# Patient Record
Sex: Female | Born: 1940 | Race: White | Hispanic: No | Marital: Married | Smoking: Former smoker
Health system: Southern US, Community
[De-identification: ages and names within clinical notes are randomized; demographics above are authoritative.]

## PROBLEM LIST (undated history)

## (undated) DIAGNOSIS — K5730 Diverticulosis of large intestine without perforation or abscess without bleeding: Secondary | ICD-10-CM

## (undated) DIAGNOSIS — M25552 Pain in left hip: Secondary | ICD-10-CM

## (undated) DIAGNOSIS — I10 Essential (primary) hypertension: Secondary | ICD-10-CM

## (undated) DIAGNOSIS — I2510 Atherosclerotic heart disease of native coronary artery without angina pectoris: Secondary | ICD-10-CM

## (undated) DIAGNOSIS — D3500 Benign neoplasm of unspecified adrenal gland: Secondary | ICD-10-CM

## (undated) DIAGNOSIS — J449 Chronic obstructive pulmonary disease, unspecified: Secondary | ICD-10-CM

## (undated) DIAGNOSIS — E785 Hyperlipidemia, unspecified: Secondary | ICD-10-CM

## (undated) HISTORY — DX: Hyperlipidemia, unspecified: E78.5

## (undated) HISTORY — DX: Benign neoplasm of unspecified adrenal gland: D35.00

## (undated) HISTORY — DX: Chronic obstructive pulmonary disease, unspecified: J44.9

## (undated) HISTORY — PX: OTHER SURGICAL HISTORY: SHX169

## (undated) HISTORY — DX: Essential (primary) hypertension: I10

## (undated) HISTORY — DX: Pain in left hip: M25.552

## (undated) HISTORY — DX: Diverticulosis of large intestine without perforation or abscess without bleeding: K57.30

## (undated) HISTORY — DX: Atherosclerotic heart disease of native coronary artery without angina pectoris: I25.10

---

## 2004-03-22 ENCOUNTER — Encounter: Payer: Self-pay | Admitting: Family Medicine

## 2005-05-12 ENCOUNTER — Ambulatory Visit: Payer: Self-pay | Admitting: Family Medicine

## 2005-06-07 ENCOUNTER — Ambulatory Visit: Payer: Self-pay | Admitting: Family Medicine

## 2005-07-05 ENCOUNTER — Ambulatory Visit: Payer: Self-pay | Admitting: Family Medicine

## 2005-07-20 ENCOUNTER — Ambulatory Visit: Payer: Self-pay | Admitting: Family Medicine

## 2005-08-23 ENCOUNTER — Ambulatory Visit: Payer: Self-pay | Admitting: Family Medicine

## 2005-09-15 ENCOUNTER — Ambulatory Visit: Payer: Self-pay | Admitting: Family Medicine

## 2005-09-29 ENCOUNTER — Ambulatory Visit: Payer: Self-pay | Admitting: Family Medicine

## 2005-10-20 ENCOUNTER — Ambulatory Visit: Payer: Self-pay | Admitting: Family Medicine

## 2006-01-19 ENCOUNTER — Ambulatory Visit: Payer: Self-pay | Admitting: Family Medicine

## 2006-03-30 ENCOUNTER — Ambulatory Visit: Payer: Self-pay | Admitting: Family Medicine

## 2006-04-10 ENCOUNTER — Encounter: Payer: Self-pay | Admitting: Family Medicine

## 2006-04-18 ENCOUNTER — Ambulatory Visit: Payer: Self-pay | Admitting: Family Medicine

## 2006-05-18 ENCOUNTER — Ambulatory Visit: Payer: Self-pay | Admitting: Family Medicine

## 2006-05-30 DIAGNOSIS — F17200 Nicotine dependence, unspecified, uncomplicated: Secondary | ICD-10-CM

## 2006-05-30 DIAGNOSIS — E785 Hyperlipidemia, unspecified: Secondary | ICD-10-CM | POA: Insufficient documentation

## 2006-05-30 DIAGNOSIS — I2510 Atherosclerotic heart disease of native coronary artery without angina pectoris: Secondary | ICD-10-CM

## 2006-05-30 DIAGNOSIS — N393 Stress incontinence (female) (male): Secondary | ICD-10-CM

## 2006-05-30 DIAGNOSIS — I10 Essential (primary) hypertension: Secondary | ICD-10-CM | POA: Insufficient documentation

## 2006-05-30 DIAGNOSIS — F339 Major depressive disorder, recurrent, unspecified: Secondary | ICD-10-CM

## 2006-05-30 DIAGNOSIS — J449 Chronic obstructive pulmonary disease, unspecified: Secondary | ICD-10-CM

## 2006-05-30 DIAGNOSIS — K219 Gastro-esophageal reflux disease without esophagitis: Secondary | ICD-10-CM | POA: Insufficient documentation

## 2006-06-07 ENCOUNTER — Ambulatory Visit: Payer: Self-pay | Admitting: Family Medicine

## 2006-07-04 ENCOUNTER — Encounter: Payer: Self-pay | Admitting: Family Medicine

## 2006-07-25 ENCOUNTER — Encounter: Payer: Self-pay | Admitting: Family Medicine

## 2006-08-07 DIAGNOSIS — K5730 Diverticulosis of large intestine without perforation or abscess without bleeding: Secondary | ICD-10-CM | POA: Insufficient documentation

## 2006-08-30 ENCOUNTER — Encounter: Payer: Self-pay | Admitting: Family Medicine

## 2006-09-18 ENCOUNTER — Telehealth: Payer: Self-pay | Admitting: Family Medicine

## 2006-10-05 ENCOUNTER — Encounter: Payer: Self-pay | Admitting: Family Medicine

## 2006-10-05 LAB — CONVERTED CEMR LAB
ALT: 147 units/L
AST: 131 units/L
Alkaline Phosphatase: 97 units/L
BUN: 4 mg/dL
HCT: 31.8 %
Platelets: 157 10*3/uL
Potassium: 3.9 meq/L
Total Bilirubin: 0.4 mg/dL
WBC, blood: 5.3 10*3/uL

## 2006-10-06 ENCOUNTER — Encounter: Payer: Self-pay | Admitting: Family Medicine

## 2006-10-09 ENCOUNTER — Encounter: Payer: Self-pay | Admitting: Family Medicine

## 2006-10-24 ENCOUNTER — Encounter: Payer: Self-pay | Admitting: Family Medicine

## 2006-11-03 ENCOUNTER — Ambulatory Visit: Payer: Self-pay | Admitting: Family Medicine

## 2006-11-03 LAB — CONVERTED CEMR LAB
HCV Ab: NEGATIVE
Hep B Core Total Ab: NEGATIVE

## 2006-11-06 ENCOUNTER — Telehealth: Payer: Self-pay | Admitting: Family Medicine

## 2006-11-07 ENCOUNTER — Encounter: Payer: Self-pay | Admitting: Family Medicine

## 2006-12-12 ENCOUNTER — Encounter: Payer: Self-pay | Admitting: Family Medicine

## 2006-12-26 ENCOUNTER — Encounter: Payer: Self-pay | Admitting: Family Medicine

## 2007-01-03 ENCOUNTER — Encounter: Payer: Self-pay | Admitting: Family Medicine

## 2007-06-20 ENCOUNTER — Ambulatory Visit: Payer: Self-pay | Admitting: Family Medicine

## 2007-06-21 ENCOUNTER — Telehealth: Payer: Self-pay | Admitting: Family Medicine

## 2007-06-22 ENCOUNTER — Encounter: Admission: RE | Admit: 2007-06-22 | Discharge: 2007-06-22 | Payer: Self-pay | Admitting: Family Medicine

## 2007-06-25 ENCOUNTER — Ambulatory Visit: Payer: Self-pay | Admitting: Family Medicine

## 2007-06-25 DIAGNOSIS — M67919 Unspecified disorder of synovium and tendon, unspecified shoulder: Secondary | ICD-10-CM | POA: Insufficient documentation

## 2007-06-25 DIAGNOSIS — M719 Bursopathy, unspecified: Secondary | ICD-10-CM

## 2007-07-13 ENCOUNTER — Encounter: Admission: RE | Admit: 2007-07-13 | Discharge: 2007-07-30 | Payer: Self-pay | Admitting: Family Medicine

## 2007-07-13 ENCOUNTER — Encounter: Payer: Self-pay | Admitting: Family Medicine

## 2007-07-30 ENCOUNTER — Encounter: Payer: Self-pay | Admitting: Family Medicine

## 2007-08-01 ENCOUNTER — Telehealth: Payer: Self-pay | Admitting: Family Medicine

## 2007-08-03 ENCOUNTER — Telehealth: Payer: Self-pay | Admitting: Family Medicine

## 2007-08-09 ENCOUNTER — Encounter: Payer: Self-pay | Admitting: Family Medicine

## 2007-08-09 ENCOUNTER — Encounter: Admission: RE | Admit: 2007-08-09 | Discharge: 2007-08-09 | Payer: Self-pay | Admitting: Sports Medicine

## 2007-08-20 ENCOUNTER — Ambulatory Visit: Payer: Self-pay | Admitting: Family Medicine

## 2007-09-24 ENCOUNTER — Telehealth: Payer: Self-pay | Admitting: Family Medicine

## 2007-10-01 ENCOUNTER — Ambulatory Visit: Payer: Self-pay | Admitting: Family Medicine

## 2007-12-03 ENCOUNTER — Telehealth: Payer: Self-pay | Admitting: Family Medicine

## 2007-12-25 ENCOUNTER — Telehealth: Payer: Self-pay | Admitting: Family Medicine

## 2007-12-27 ENCOUNTER — Telehealth: Payer: Self-pay | Admitting: Family Medicine

## 2008-01-18 ENCOUNTER — Encounter: Payer: Self-pay | Admitting: Family Medicine

## 2008-01-18 LAB — CONVERTED CEMR LAB
AST: 16 units/L (ref 0–37)
Albumin: 4.2 g/dL (ref 3.5–5.2)
Alkaline Phosphatase: 78 units/L (ref 39–117)
CO2: 22 meq/L (ref 19–32)
Calcium: 9.3 mg/dL (ref 8.4–10.5)
Cholesterol: 205 mg/dL — ABNORMAL HIGH (ref 0–200)
Creatinine, Ser: 0.86 mg/dL (ref 0.40–1.20)
Potassium: 4.8 meq/L (ref 3.5–5.3)
Sodium: 138 meq/L (ref 135–145)
Total Bilirubin: 0.5 mg/dL (ref 0.3–1.2)
Total CHOL/HDL Ratio: 5.1

## 2008-01-21 ENCOUNTER — Encounter: Payer: Self-pay | Admitting: Family Medicine

## 2008-01-31 ENCOUNTER — Encounter: Admission: RE | Admit: 2008-01-31 | Discharge: 2008-01-31 | Payer: Self-pay | Admitting: Family Medicine

## 2008-01-31 ENCOUNTER — Ambulatory Visit: Payer: Self-pay | Admitting: Family Medicine

## 2008-02-01 ENCOUNTER — Telehealth (INDEPENDENT_AMBULATORY_CARE_PROVIDER_SITE_OTHER): Payer: Self-pay | Admitting: *Deleted

## 2008-02-11 ENCOUNTER — Encounter: Payer: Self-pay | Admitting: Family Medicine

## 2008-03-24 ENCOUNTER — Encounter: Admission: RE | Admit: 2008-03-24 | Discharge: 2008-03-24 | Payer: Self-pay | Admitting: Internal Medicine

## 2008-05-22 ENCOUNTER — Ambulatory Visit: Payer: Self-pay | Admitting: Family Medicine

## 2008-05-23 ENCOUNTER — Telehealth: Payer: Self-pay | Admitting: Family Medicine

## 2008-06-02 ENCOUNTER — Telehealth (INDEPENDENT_AMBULATORY_CARE_PROVIDER_SITE_OTHER): Payer: Self-pay | Admitting: *Deleted

## 2008-06-02 ENCOUNTER — Ambulatory Visit: Payer: Self-pay | Admitting: Family Medicine

## 2008-06-02 ENCOUNTER — Telehealth: Payer: Self-pay | Admitting: Family Medicine

## 2008-06-02 DIAGNOSIS — T7840XA Allergy, unspecified, initial encounter: Secondary | ICD-10-CM | POA: Insufficient documentation

## 2008-06-09 ENCOUNTER — Encounter: Payer: Self-pay | Admitting: Family Medicine

## 2008-06-10 ENCOUNTER — Telehealth: Payer: Self-pay | Admitting: Family Medicine

## 2008-06-11 ENCOUNTER — Telehealth (INDEPENDENT_AMBULATORY_CARE_PROVIDER_SITE_OTHER): Payer: Self-pay | Admitting: *Deleted

## 2008-06-16 ENCOUNTER — Telehealth: Payer: Self-pay | Admitting: Family Medicine

## 2008-07-03 ENCOUNTER — Telehealth: Payer: Self-pay | Admitting: Family Medicine

## 2008-07-07 ENCOUNTER — Ambulatory Visit: Payer: Self-pay | Admitting: Family Medicine

## 2008-08-04 ENCOUNTER — Ambulatory Visit: Payer: Self-pay | Admitting: Family Medicine

## 2008-08-04 DIAGNOSIS — R7309 Other abnormal glucose: Secondary | ICD-10-CM | POA: Insufficient documentation

## 2008-08-06 ENCOUNTER — Encounter: Payer: Self-pay | Admitting: Family Medicine

## 2008-08-13 ENCOUNTER — Telehealth: Payer: Self-pay | Admitting: Family Medicine

## 2008-08-13 ENCOUNTER — Ambulatory Visit: Payer: Self-pay | Admitting: Family Medicine

## 2008-08-13 DIAGNOSIS — M2550 Pain in unspecified joint: Secondary | ICD-10-CM

## 2008-08-13 DIAGNOSIS — R609 Edema, unspecified: Secondary | ICD-10-CM

## 2008-08-13 LAB — CONVERTED CEMR LAB
Bilirubin Urine: NEGATIVE
Blood in Urine, dipstick: NEGATIVE
Ketones, urine, test strip: NEGATIVE
Protein, U semiquant: NEGATIVE
Urobilinogen, UA: NEGATIVE
pH: 7

## 2008-08-14 ENCOUNTER — Encounter: Payer: Self-pay | Admitting: Family Medicine

## 2008-08-18 ENCOUNTER — Telehealth: Payer: Self-pay | Admitting: Family Medicine

## 2008-08-18 ENCOUNTER — Telehealth (INDEPENDENT_AMBULATORY_CARE_PROVIDER_SITE_OTHER): Payer: Self-pay | Admitting: *Deleted

## 2008-08-19 LAB — CONVERTED CEMR LAB
ALT: 52 units/L — ABNORMAL HIGH (ref 0–35)
Albumin: 4.3 g/dL (ref 3.5–5.2)
Chloride: 107 meq/L (ref 96–112)
Glucose, Bld: 84 mg/dL (ref 70–99)
Iron: 82 ug/dL (ref 42–145)
MCHC: 32.3 g/dL (ref 30.0–36.0)
MCV: 96.2 fL (ref 78.0–100.0)
Platelets: 321 10*3/uL (ref 150–400)
RBC: 3.96 M/uL (ref 3.87–5.11)
RDW: 15.4 % (ref 11.5–15.5)
Rhuematoid fact SerPl-aCnc: <20 intl units/mL (ref 0–20)
Sed Rate: 32 mm/hr — ABNORMAL HIGH (ref 0–22)
Total Protein: 6.6 g/dL (ref 6.0–8.3)
WBC: 5.5 10*3/uL (ref 4.0–10.5)

## 2008-08-26 ENCOUNTER — Encounter: Payer: Self-pay | Admitting: Family Medicine

## 2008-08-27 ENCOUNTER — Encounter: Admission: RE | Admit: 2008-08-27 | Discharge: 2008-08-27 | Payer: Self-pay | Admitting: Internal Medicine

## 2008-08-27 ENCOUNTER — Ambulatory Visit: Payer: Self-pay | Admitting: Family Medicine

## 2008-08-27 DIAGNOSIS — Z8639 Personal history of other endocrine, nutritional and metabolic disease: Secondary | ICD-10-CM

## 2008-08-27 DIAGNOSIS — Z862 Personal history of diseases of the blood and blood-forming organs and certain disorders involving the immune mechanism: Secondary | ICD-10-CM

## 2008-09-05 ENCOUNTER — Encounter: Payer: Self-pay | Admitting: Family Medicine

## 2008-09-12 ENCOUNTER — Encounter: Payer: Self-pay | Admitting: Family Medicine

## 2008-09-16 LAB — CONVERTED CEMR LAB
ALT: 15 units/L (ref 0–35)
AST: 16 units/L (ref 0–37)

## 2008-10-02 ENCOUNTER — Telehealth: Payer: Self-pay | Admitting: Family Medicine

## 2008-10-06 ENCOUNTER — Telehealth: Payer: Self-pay | Admitting: Family Medicine

## 2008-12-12 ENCOUNTER — Encounter: Payer: Self-pay | Admitting: Family Medicine

## 2009-01-22 ENCOUNTER — Telehealth: Payer: Self-pay | Admitting: Family Medicine

## 2009-01-23 ENCOUNTER — Telehealth (INDEPENDENT_AMBULATORY_CARE_PROVIDER_SITE_OTHER): Payer: Self-pay | Admitting: *Deleted

## 2009-01-26 ENCOUNTER — Telehealth: Payer: Self-pay | Admitting: Family Medicine

## 2009-01-26 ENCOUNTER — Ambulatory Visit: Payer: Self-pay | Admitting: Family Medicine

## 2009-01-26 ENCOUNTER — Encounter: Admission: RE | Admit: 2009-01-26 | Discharge: 2009-01-26 | Payer: Self-pay | Admitting: Family Medicine

## 2009-01-26 DIAGNOSIS — S335XXA Sprain of ligaments of lumbar spine, initial encounter: Secondary | ICD-10-CM

## 2009-01-26 LAB — CONVERTED CEMR LAB
Glucose, Urine, Semiquant: NEGATIVE
Protein, U semiquant: NEGATIVE

## 2009-01-27 ENCOUNTER — Encounter: Payer: Self-pay | Admitting: Family Medicine

## 2009-02-06 ENCOUNTER — Telehealth: Payer: Self-pay | Admitting: Family Medicine

## 2009-02-09 ENCOUNTER — Encounter: Payer: Self-pay | Admitting: Family Medicine

## 2009-02-10 ENCOUNTER — Telehealth (INDEPENDENT_AMBULATORY_CARE_PROVIDER_SITE_OTHER): Payer: Self-pay | Admitting: *Deleted

## 2009-02-10 ENCOUNTER — Encounter: Payer: Self-pay | Admitting: Family Medicine

## 2009-02-17 ENCOUNTER — Telehealth (INDEPENDENT_AMBULATORY_CARE_PROVIDER_SITE_OTHER): Payer: Self-pay | Admitting: *Deleted

## 2009-02-27 ENCOUNTER — Telehealth: Payer: Self-pay | Admitting: Family Medicine

## 2009-03-09 ENCOUNTER — Telehealth: Payer: Self-pay | Admitting: Family Medicine

## 2009-03-17 ENCOUNTER — Telehealth: Payer: Self-pay | Admitting: Family Medicine

## 2009-04-14 ENCOUNTER — Encounter: Payer: Self-pay | Admitting: Family Medicine

## 2009-04-28 ENCOUNTER — Telehealth: Payer: Self-pay | Admitting: Family Medicine

## 2009-05-28 ENCOUNTER — Telehealth: Payer: Self-pay | Admitting: Family Medicine

## 2009-06-08 ENCOUNTER — Telehealth: Payer: Self-pay | Admitting: Family Medicine

## 2009-06-30 ENCOUNTER — Ambulatory Visit: Payer: Self-pay | Admitting: Family Medicine

## 2009-07-02 ENCOUNTER — Encounter: Payer: Self-pay | Admitting: Family Medicine

## 2009-07-03 LAB — CONVERTED CEMR LAB
AST: 14 units/L (ref 0–37)
Albumin: 4.1 g/dL (ref 3.5–5.2)
Alkaline Phosphatase: 69 units/L (ref 39–117)
BUN: 21 mg/dL (ref 6–23)
Basophils Absolute: 0 10*3/uL (ref 0.0–0.1)
Basophils Relative: 0 % (ref 0–1)
Calcium: 9.4 mg/dL (ref 8.4–10.5)
Creatinine, Ser: 0.98 mg/dL (ref 0.40–1.20)
Eosinophils Absolute: 0.1 10*3/uL (ref 0.0–0.7)
Eosinophils Relative: 1 % (ref 0–5)
Lymphocytes Relative: 32 % (ref 12–46)
MCV: 92.6 fL (ref 78.0–100.0)
Monocytes Absolute: 0.2 10*3/uL (ref 0.1–1.0)
Platelets: 262 10*3/uL (ref 150–400)
RBC: 4.34 M/uL (ref 3.87–5.11)
RDW: 13.9 % (ref 11.5–15.5)
Total Protein: 6.3 g/dL (ref 6.0–8.3)
VLDL: 39 mg/dL (ref 0–40)
WBC: 8.9 10*3/uL (ref 4.0–10.5)

## 2009-07-27 ENCOUNTER — Encounter: Payer: Self-pay | Admitting: Family Medicine

## 2009-07-30 ENCOUNTER — Encounter: Payer: Self-pay | Admitting: Family Medicine

## 2009-07-30 DIAGNOSIS — I080 Rheumatic disorders of both mitral and aortic valves: Secondary | ICD-10-CM

## 2009-07-31 ENCOUNTER — Ambulatory Visit: Payer: Self-pay | Admitting: Family Medicine

## 2009-07-31 DIAGNOSIS — N3000 Acute cystitis without hematuria: Secondary | ICD-10-CM | POA: Insufficient documentation

## 2009-07-31 LAB — CONVERTED CEMR LAB
Bilirubin Urine: NEGATIVE
Glucose, Urine, Semiquant: NEGATIVE
Ketones, urine, test strip: NEGATIVE
Protein, U semiquant: NEGATIVE
Specific Gravity, Urine: 1.01

## 2009-08-01 ENCOUNTER — Encounter: Payer: Self-pay | Admitting: Family Medicine

## 2009-08-11 ENCOUNTER — Telehealth: Payer: Self-pay | Admitting: Family Medicine

## 2009-08-13 ENCOUNTER — Encounter: Admission: RE | Admit: 2009-08-13 | Discharge: 2009-08-13 | Payer: Self-pay | Admitting: Sports Medicine

## 2009-08-24 ENCOUNTER — Telehealth (INDEPENDENT_AMBULATORY_CARE_PROVIDER_SITE_OTHER): Payer: Self-pay | Admitting: *Deleted

## 2009-08-28 ENCOUNTER — Encounter: Admission: RE | Admit: 2009-08-28 | Discharge: 2009-08-28 | Payer: Self-pay | Admitting: Internal Medicine

## 2009-11-05 ENCOUNTER — Telehealth: Payer: Self-pay | Admitting: Family Medicine

## 2009-12-09 ENCOUNTER — Telehealth: Payer: Self-pay | Admitting: Family Medicine

## 2009-12-12 ENCOUNTER — Ambulatory Visit: Payer: Self-pay | Admitting: Family Medicine

## 2009-12-12 ENCOUNTER — Encounter: Payer: Self-pay | Admitting: Family Medicine

## 2009-12-12 DIAGNOSIS — IMO0002 Reserved for concepts with insufficient information to code with codable children: Secondary | ICD-10-CM | POA: Insufficient documentation

## 2009-12-12 DIAGNOSIS — M25529 Pain in unspecified elbow: Secondary | ICD-10-CM | POA: Insufficient documentation

## 2010-01-08 ENCOUNTER — Telehealth: Payer: Self-pay | Admitting: Family Medicine

## 2010-02-11 ENCOUNTER — Encounter: Payer: Self-pay | Admitting: Family Medicine

## 2010-02-16 ENCOUNTER — Encounter: Payer: Self-pay | Admitting: Family Medicine

## 2010-02-23 ENCOUNTER — Ambulatory Visit: Payer: Self-pay | Admitting: Family Medicine

## 2010-02-23 DIAGNOSIS — J441 Chronic obstructive pulmonary disease with (acute) exacerbation: Secondary | ICD-10-CM | POA: Insufficient documentation

## 2010-03-05 ENCOUNTER — Ambulatory Visit: Payer: Self-pay | Admitting: Family Medicine

## 2010-03-29 ENCOUNTER — Telehealth: Payer: Self-pay | Admitting: Family Medicine

## 2010-04-06 ENCOUNTER — Encounter: Admission: RE | Admit: 2010-04-06 | Discharge: 2010-04-06 | Payer: Self-pay | Admitting: Family Medicine

## 2010-04-06 ENCOUNTER — Ambulatory Visit: Payer: Self-pay | Admitting: Family Medicine

## 2010-04-06 DIAGNOSIS — J42 Unspecified chronic bronchitis: Secondary | ICD-10-CM | POA: Insufficient documentation

## 2010-04-12 ENCOUNTER — Encounter: Payer: Self-pay | Admitting: Family Medicine

## 2010-04-14 ENCOUNTER — Telehealth (INDEPENDENT_AMBULATORY_CARE_PROVIDER_SITE_OTHER): Payer: Self-pay | Admitting: *Deleted

## 2010-04-16 ENCOUNTER — Encounter: Payer: Self-pay | Admitting: Family Medicine

## 2010-04-23 ENCOUNTER — Telehealth: Payer: Self-pay | Admitting: Family Medicine

## 2010-05-14 ENCOUNTER — Telehealth: Payer: Self-pay | Admitting: Family Medicine

## 2010-05-25 ENCOUNTER — Encounter (INDEPENDENT_AMBULATORY_CARE_PROVIDER_SITE_OTHER): Payer: Self-pay | Admitting: *Deleted

## 2010-06-07 ENCOUNTER — Ambulatory Visit: Payer: Self-pay | Admitting: Family Medicine

## 2010-06-07 DIAGNOSIS — R61 Generalized hyperhidrosis: Secondary | ICD-10-CM | POA: Insufficient documentation

## 2010-06-07 DIAGNOSIS — G2581 Restless legs syndrome: Secondary | ICD-10-CM | POA: Insufficient documentation

## 2010-07-05 ENCOUNTER — Encounter: Payer: Self-pay | Admitting: Family Medicine

## 2010-07-06 LAB — CONVERTED CEMR LAB
AST: 17 units/L (ref 0–37)
Alkaline Phosphatase: 77 units/L (ref 39–117)
Basophils Absolute: 0 10*3/uL (ref 0.0–0.1)
Basophils Relative: 1 % (ref 0–1)
CO2: 25 meq/L (ref 19–32)
Calcium: 9.6 mg/dL (ref 8.4–10.5)
Eosinophils Absolute: 0.2 10*3/uL (ref 0.0–0.7)
Eosinophils Relative: 2 % (ref 0–5)
Glucose, Bld: 108 mg/dL — ABNORMAL HIGH (ref 70–99)
Hemoglobin: 14.2 g/dL (ref 12.0–15.0)
LDL Cholesterol: 80 mg/dL (ref 0–99)
Lymphocytes Relative: 29 % (ref 12–46)
MCV: 95.7 fL (ref 78.0–100.0)
Neutro Abs: 4.7 10*3/uL (ref 1.7–7.7)
Neutrophils Relative %: 64 % (ref 43–77)
Platelets: 241 10*3/uL (ref 150–400)
Potassium: 4.3 meq/L (ref 3.5–5.3)
Triglycerides: 170 mg/dL — ABNORMAL HIGH (ref <150)

## 2010-07-20 ENCOUNTER — Ambulatory Visit: Payer: Self-pay | Admitting: Family Medicine

## 2010-08-24 ENCOUNTER — Telehealth (INDEPENDENT_AMBULATORY_CARE_PROVIDER_SITE_OTHER): Payer: Self-pay | Admitting: *Deleted

## 2010-08-26 ENCOUNTER — Telehealth: Payer: Self-pay | Admitting: Family Medicine

## 2010-08-31 ENCOUNTER — Encounter: Payer: Self-pay | Admitting: Family Medicine

## 2010-09-02 ENCOUNTER — Encounter: Payer: Self-pay | Admitting: Family Medicine

## 2010-09-21 NOTE — Assessment & Plan Note (Signed)
Summary: ELBOW SWOLLEN/TM   Vital Signs:  Patient Profile:   70 Years Old Female CC:      R elbow pain x 2dys Height:     64.5 inches Weight:      192 pounds O2 Sat:      100 % O2 treatment:    Room Air Temp:     97.3 degrees F oral Pulse rate:   71 / minute Pulse rhythm:   regular Resp:     16 per minute BP sitting:   131 / 81  (right arm) Cuff size:   regular  Vitals Entered By: Areta Haber CMA (December 12, 2009 5:06 PM)                  Prior Medication List:  ADVAIR DISKUS 100-50 MCG/DOSE MISC (FLUTICASONE-SALMETEROL) 1 puff bid ALBUTEROL SULFATE (2.5 MG/3ML) 0.083% NEBU (ALBUTEROL SULFATE) every 4-6 hrs as needed ASPIR-LOW 81 MG TBEC (ASPIRIN) by mouth daily NORVASC 5 MG TABS (AMLODIPINE BESYLATE) 1 tab by mouth daily CITALOPRAM HYDROBROMIDE 40 MG TABS (CITALOPRAM HYDROBROMIDE) 1 tab by mouth once daily TOPROL XL 100 MG XR24H-TAB (METOPROLOL SUCCINATE) 1 tab by mouth daily HYDROCODONE-HOMATROPINE 5-1.5 MG/5ML SYRP (HYDROCODONE-HOMATROPINE) 5 ml by mouth at bedtime as needed cough CIPROFLOXACIN HCL 250 MG TABS (CIPROFLOXACIN HCL) 1 PO two times a day PYRIDIUM 100 MG TABS (PHENAZOPYRIDINE HCL) 1 by mouth three times a day pc   Current Allergies (reviewed today): ! CODEINE * ALTACE     History of Present Illness Chief Complaint: R elbow pain x 2dys  Current Problems: ELBOW PAIN, RIGHT (ICD-719.42) MUSCULOSKELETAL PAIN (ICD-781.99) ACUTE CYSTITIS (ICD-595.0) ACUTE CYSTITIS (ICD-595.0) MITRAL REGURGITATION (ICD-396.3) LUMBAR SPRAIN AND STRAIN (ICD-847.2) LIVER FUNCTION TESTS, ABNORMAL, HX OF (ICD-V12.2) POLYARTHRALGIA (ICD-719.49) EDEMA (ICD-782.3) HYPERGLYCEMIA (ICD-790.29) ALLERGIC REACTION (ICD-995.3) ROTATOR CUFF SYNDROME, RIGHT (ICD-726.10) DIVERTICULOSIS, COLON (ICD-562.10) TOBACCO DEPENDENCE (ICD-305.1) INCONTINENCE, STRESS, FEMALE (ICD-625.6) HYPERTENSION, BENIGN SYSTEMIC (ICD-401.1) HYPERLIPIDEMIA (ICD-272.4) GASTROESOPHAGEAL REFLUX, NO  ESOPHAGITIS (ICD-530.81) DEPRESSION, MAJOR, RECURRENT (ICD-296.30) COPD (ICD-496) ARTERIOSCLEROSIS, CORONARY (ICD-414.00)   Current Meds ADVAIR DISKUS 100-50 MCG/DOSE MISC (FLUTICASONE-SALMETEROL) 1 puff bid ALBUTEROL SULFATE (2.5 MG/3ML) 0.083% NEBU (ALBUTEROL SULFATE) every 4-6 hrs as needed ASPIR-LOW 81 MG TBEC (ASPIRIN) by mouth daily NORVASC 5 MG TABS (AMLODIPINE BESYLATE) 1 tab by mouth daily CITALOPRAM HYDROBROMIDE 40 MG TABS (CITALOPRAM HYDROBROMIDE) 1 tab by mouth once daily TOPROL XL 100 MG XR24H-TAB (METOPROLOL SUCCINATE) 1 tab by mouth daily HYDROCODONE-HOMATROPINE 5-1.5 MG/5ML SYRP (HYDROCODONE-HOMATROPINE) 5 ml by mouth at bedtime as needed cough CIPROFLOXACIN HCL 250 MG TABS (CIPROFLOXACIN HCL) 1 PO two times a day PYRIDIUM 100 MG TABS (PHENAZOPYRIDINE HCL) 1 by mouth three times a day pc  REVIEW OF SYSTEMS Constitutional Symptoms      Denies fever, chills, night sweats, weight loss, weight gain, and fatigue.  Eyes       Denies change in vision, eye pain, eye discharge, glasses, contact lenses, and eye surgery. Ear/Nose/Throat/Mouth       Denies hearing loss/aids, change in hearing, ear pain, ear discharge, dizziness, frequent runny nose, frequent nose bleeds, sinus problems, sore throat, hoarseness, and tooth pain or bleeding.  Respiratory       Denies dry cough, productive cough, wheezing, shortness of breath, asthma, bronchitis, and emphysema/COPD.  Cardiovascular       Denies murmurs, chest pain, and tires easily with exhertion.    Gastrointestinal       Denies stomach pain, nausea/vomiting, diarrhea, constipation, blood in bowel movements, and indigestion. Genitourniary       Denies  painful urination, kidney stones, and loss of urinary control. Neurological       Denies paralysis, seizures, and fainting/blackouts. Musculoskeletal       Complains of redness and swelling.      Denies muscle pain, joint pain, joint stiffness, decreased range of motion, muscle  weakness, and gout.      Comments: R elbow x 2 dys Skin       Denies bruising, unusual mles/lumps or sores, and hair/skin or nail changes.  Psych       Denies mood changes, temper/anger issues, anxiety/stress, speech problems, depression, and sleep problems. Other Comments: Pt has not seen PCP for this.   Past History:  Past Medical History: Last updated: 08/27/2008 [id number] Left hip pain Diverticulosis, colon 1 cm L adrenal adenoma (CT done by Dr Zachery Conch 12-07) stress and urge urinary incontinence-- may go for [facility] mid-urethral sling surgery cath 4-08 normal COPD/ lung nodule -- Dr Steele Berg HTN hyperlipidemia CAD-- Dr Lorenso Courier , [facility] cardiology  Past Surgical History: Last updated: 10/03/2006 [place] mid urethral sling done by Dr Doristine Locks  Family History: Last updated: July 12, 2006 Father died of Oat cell lung CA, ETOH  Mother died with HTN; high cholestrol  Social History: Last updated: 08/27/2008 Retired.  Lives with husband Onalee Hua.  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  Having trouble with her son who is abusing perscription meds.  Walks 1 hrs daily.  Sexually active.  Quit smoking in 12-09.  Risk Factors: Smoking Status: quit (08/27/2008) Assessment New Problems: ELBOW PAIN, RIGHT (ICD-719.42) MUSCULOSKELETAL PAIN (ICD-781.99)   The patient and/or caregiver has been counseled thoroughly with regard to medications prescribed including dosage, schedule, interactions, rationale for use, and possible side effects and they verbalize understanding.  Diagnoses and expected course of recovery discussed and will return if not improved as expected or if the condition worsens. Patient and/or caregiver verbalized understanding.

## 2010-09-21 NOTE — Assessment & Plan Note (Signed)
Summary: ELBOW/TM    Prior Medication List:  ADVAIR DISKUS 100-50 MCG/DOSE MISC (FLUTICASONE-SALMETEROL) 1 puff bid ALBUTEROL SULFATE (2.5 MG/3ML) 0.083% NEBU (ALBUTEROL SULFATE) every 4-6 hrs as needed ASPIR-LOW 81 MG TBEC (ASPIRIN) by mouth daily NORVASC 5 MG TABS (AMLODIPINE BESYLATE) 1 tab by mouth daily CITALOPRAM HYDROBROMIDE 40 MG TABS (CITALOPRAM HYDROBROMIDE) 1 tab by mouth once daily TOPROL XL 100 MG XR24H-TAB (METOPROLOL SUCCINATE) 1 tab by mouth daily HYDROCODONE-HOMATROPINE 5-1.5 MG/5ML SYRP (HYDROCODONE-HOMATROPINE) 5 ml by mouth at bedtime as needed cough CIPROFLOXACIN HCL 250 MG TABS (CIPROFLOXACIN HCL) 1 PO two times a day PYRIDIUM 100 MG TABS (PHENAZOPYRIDINE HCL) 1 by mouth three times a day pc   Current Allergies: ! CODEINE * ALTACEHistory of Present Illness History of Present Illness: Patient  states she has had elbow pain for 3 days. Worse last night and the elbow has gotten red. She denies any injury or insect bites. States that there is an area on he elbow that ifshe touches it feels like electricity goes up her elbow.  Current Problems: CELLULITIS AND ABSCESS OF UPPER ARM AND FOREARM (ICD-682.3) ELBOW PAIN, RIGHT (ICD-719.42) MUSCULOSKELETAL PAIN (ICD-781.99) ELBOW PAIN, RIGHT (ICD-719.42) MUSCULOSKELETAL PAIN (ICD-781.99) ACUTE CYSTITIS (ICD-595.0) ACUTE CYSTITIS (ICD-595.0) MITRAL REGURGITATION (ICD-396.3) LUMBAR SPRAIN AND STRAIN (ICD-847.2) LIVER FUNCTION TESTS, ABNORMAL, HX OF (ICD-V12.2) POLYARTHRALGIA (ICD-719.49) EDEMA (ICD-782.3) HYPERGLYCEMIA (ICD-790.29) ALLERGIC REACTION (ICD-995.3) ROTATOR CUFF SYNDROME, RIGHT (ICD-726.10) DIVERTICULOSIS, COLON (ICD-562.10) TOBACCO DEPENDENCE (ICD-305.1) INCONTINENCE, STRESS, FEMALE (ICD-625.6) HYPERTENSION, BENIGN SYSTEMIC (ICD-401.1) HYPERLIPIDEMIA (ICD-272.4) GASTROESOPHAGEAL REFLUX, NO ESOPHAGITIS (ICD-530.81) DEPRESSION, MAJOR, RECURRENT (ICD-296.30) COPD (ICD-496) ARTERIOSCLEROSIS, CORONARY  (ICD-414.00)   Current Meds ADVAIR DISKUS 100-50 MCG/DOSE MISC (FLUTICASONE-SALMETEROL) 1 puff bid ALBUTEROL SULFATE (2.5 MG/3ML) 0.083% NEBU (ALBUTEROL SULFATE) every 4-6 hrs as needed ASPIR-LOW 81 MG TBEC (ASPIRIN) by mouth daily NORVASC 5 MG TABS (AMLODIPINE BESYLATE) 1 tab by mouth daily CITALOPRAM HYDROBROMIDE 40 MG TABS (CITALOPRAM HYDROBROMIDE) 1 tab by mouth once daily TOPROL XL 100 MG XR24H-TAB (METOPROLOL SUCCINATE) 1 tab by mouth daily HYDROCODONE-HOMATROPINE 5-1.5 MG/5ML SYRP (HYDROCODONE-HOMATROPINE) 5 ml by mouth at bedtime as needed cough CIPROFLOXACIN HCL 250 MG TABS (CIPROFLOXACIN HCL) 1 PO two times a day PYRIDIUM 100 MG TABS (PHENAZOPYRIDINE HCL) 1 by mouth three times a day pc SEPTRA DS 800-160 MG TABS (SULFAMETHOXAZOLE-TRIMETHOPRIM) 1 by mouth twice daily BACTROBAN 2 % OINT (MUPIROCIN) apply to elbow 3 x a day next 10 days  REVIEW OF SYSTEMS Constitutional Symptoms      Denies fever and chills.  Eyes       Denies change in vision. Ear/Nose/Throat/Mouth       Denies hearing loss/aids, change in hearing, and frequent runny nose.      Musculoskeletal       Complains of muscle pain, joint pain, joint stiffness, redness, and swelling.      Denies decreased range of motion and muscle weakness.    Past History:  Past Medical History: Last updated: 08/27/2008 [id number] Left hip pain Diverticulosis, colon 1 cm L adrenal adenoma (CT done by Dr Zachery Conch 12-07) stress and urge urinary incontinence-- may go for [facility] mid-urethral sling surgery cath 4-08 normal COPD/ lung nodule -- Dr Steele Berg HTN hyperlipidemia CAD-- Dr Lorenso Courier , [facility] cardiology  Past Surgical History: Last updated: 10/03/2006 [place] mid urethral sling done by Dr Doristine Locks  Family History: Last updated: 20-Jul-2006 Father died of Oat cell lung CA, ETOH  Mother died with HTN; high cholestrol  Social History: Last updated: 08/27/2008 Retired.  Lives with husband Onalee Hua.  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  Having trouble with her son who is abusing perscription meds.  Walks 1 hrs daily.  Sexually active.  Quit smoking in 12-09.  Risk Factors: Smoking Status: quit (08/27/2008)  Family History: Reviewed history from 07/04/2006 and no changes required. Father died of Oat cell lung CA, ETOH  Mother died with HTN; high cholestrol  Social History: Reviewed history from 08/27/2008 and no changes required. Retired.  Lives with husband Onalee Hua.  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  Having trouble with her son who is abusing perscription meds.  Walks 1 hrs daily.  Sexually active.  Quit smoking in 12-09. Physical Exam General appearance: well developed, well nourished,mild distress Head: normocephalic, atraumatic Extremities: R elbow has diffusely hyperemic, warm to thre touch and pin point tendernessss. Skin: no obvious rashes or lesions MSE: oriented to time, place, and person Assessment New Problems: CELLULITIS AND ABSCESS OF UPPER ARM AND FOREARM (ICD-682.3) ELBOW PAIN, RIGHT (ICD-719.42) MUSCULOSKELETAL PAIN (ICD-781.99)  cellulitis of the elbow elbow pain  Plan New Medications/Changes: BACTROBAN 2 % OINT (MUPIROCIN) apply to elbow 3 x a day next 10 days  #1 tube x 0, 12/12/2009, Hassan Rowan MD SEPTRA DS 800-160 MG TABS (SULFAMETHOXAZOLE-TRIMETHOPRIM) 1 by mouth twice daily  #20 x 0, 12/12/2009, Hassan Rowan MD  New Orders: New Patient Level III [99203] Ketorolac-Toradol 15mg  [Z6109] T-DG Elbow 2 Views*R* [73070] Admin of Therapeutic Inj  intramuscular or subcutaneous [96372] Planning Comments:   continue w/ other medication  Follow Up: Follow up in 2-3 days if no improvement, Follow up with Primary Physician Follow Up: or w/ orthopedic  The patient and/or caregiver has been counseled thoroughly with regard to medications prescribed including dosage, schedule, interactions, rationale for use, and possible side effects and they verbalize understanding.  Diagnoses and  expected course of recovery discussed and will return if not improved as expected or if the condition worsens. Patient and/or caregiver verbalized understanding.  Prescriptions: BACTROBAN 2 % OINT (MUPIROCIN) apply to elbow 3 x a day next 10 days  #1 tube x 0   Entered and Authorized by:   Hassan Rowan MD   Signed by:   Hassan Rowan MD on 12/12/2009   Method used:   Printed then faxed to ...       [facility]  [address] [phone]* (retail)       [address] [place], [place]  [address]       Ph: [phone] or [phone]       Fax: [phone]   RxID:   [id number] SEPTRA DS 800-160 MG TABS (SULFAMETHOXAZOLE-TRIMETHOPRIM) 1 by mouth twice daily  #20 x 0   Entered and Authorized by:   Hassan Rowan MD   Signed by:   Hassan Rowan MD on 12/12/2009   Method used:   Printed then faxed to ...       [facility]  [address] [phone]* (retail)       [address] [place], [place]  [address]       Ph: [phone] or [phone]       Fax: [phone]   RxID:   [id number]   Patient Instructions: 1)  Please schedule an appointment with your primary doctor in :3-4 days if not improving 2)  Take your antibiotic as prescribed until ALL of it is gone, but stop if you develop a rash or swelling and contact our office as soon as possible. 3)  While tis could also be a bursitis of  the elbow it does not seems more likely that this is an infectious problem 4)  Please schedule a follow-up appointment as needed.   Medication Administration  Injection # 1:    Medication: Ketorolac-Toradol 15mg     Diagnosis: CELLULITIS AND ABSCESS OF UPPER ARM AND FOREARM (ICD-682.3)    Route: IM    Site: LUOQ gluteus    Exp Date: 03/23/2011    Lot #: [id number]    Mfr: Hospira    Comments: Administered 60mg     Patient tolerated injection without complications    Given by: Areta Haber CMA (December 12, 2009 6:09 PM)  Orders Added: 1)  New Patient Level III [99203] 2)  Ketorolac-Toradol 15mg  [J1885] 3)   T-DG Elbow 2 Views*R* [73070] 4)  Admin of Therapeutic Inj  intramuscular or subcutaneous [64332]

## 2010-09-21 NOTE — Progress Notes (Signed)
Summary: Cough med  Phone Note Call from Patient Call back at Home Phone [phone]   Caller: Patient Call For: Seymour Bars DO Summary of Call: Pt called and wanted to know if you are gonna refill her cough med. [facility] Initial call taken by: Kathlene November,  May 14, 2010 1:20 PM  Follow-up for Phone Call        this will have to go thru her pulmonology office from now on. Follow-up by: Seymour Bars DO,  May 14, 2010 1:21 PM     Appended Document: Cough med 05/14/2010 @ 1:29pm- Pt notifeid of above instructions. KJ LPN

## 2010-09-21 NOTE — Progress Notes (Signed)
Summary: Cough Rx  Phone Note Call from Patient   Caller: Patient Summary of Call: Pt called stating she only got 50mL on 10/26/09. I called pharm and pharm states that she is only allowed per month w/ insurance. Pt needs Rx for and any future refills should be written for . Please advise.  Initial call taken by: Payton Spark CMA,  November 05, 2009 10:08 AM    New/Updated Medications: HYDROCODONE-HOMATROPINE 5-1.5 MG/5ML SYRP (HYDROCODONE-HOMATROPINE) 5 ml by mouth at bedtime as needed cough Prescriptions: HYDROCODONE-HOMATROPINE 5-1.5 MG/5ML SYRP (HYDROCODONE-HOMATROPINE) 5 ml by mouth at bedtime as needed cough  #100 ml x 0   Entered and Authorized by:   Seymour Bars DO   Signed by:   Seymour Bars DO on 11/05/2009   Method used:   Printed then faxed to ...       [facility]  [address] [phone]* (retail)       [address] [place], [place]  [address]       Ph: [phone] or [phone]       Fax: [phone]   RxID:   [id number]   Appended Document: Cough Rx LMOM for Pt to CB.Arvilla Market CMA, Michelle November 05, 2009 10:36 AM   Pt aware.Arvilla Market CMA, Michelle November 05, 2009 1:56 PM

## 2010-09-21 NOTE — Progress Notes (Signed)
Summary: RF cough meds  Phone Note Refill Request Message from:  Patient on March 29, 2010 1:33 PM  Refills Requested: Medication #1:  HYDROCODONE-HOMATROPINE 5-1.5 MG/5ML SYRP 5 ml by mouth q 6 hrs as needed cough Pt would like Rx filled bc her cough is bad at night and needs Rx to get rest.  Initial call taken by: Payton Spark CMA,  March 29, 2010 1:34 PM    Prescriptions: HYDROCODONE-HOMATROPINE 5-1.5 MG/5ML SYRP (HYDROCODONE-HOMATROPINE) 5 ml by mouth q 6 hrs as needed cough  #150 ml x 0   Entered and Authorized by:   Seymour Bars DO   Signed by:   Seymour Bars DO on 03/29/2010   Method used:   Printed then faxed to ...       [facility]  [address] [phone]* (retail)       [address] [place], [place]  [address]       Ph: [phone] or [phone]       Fax: [phone]   RxID:   [id number]   Appended Document: RF cough meds Rx faxed

## 2010-09-21 NOTE — Assessment & Plan Note (Signed)
Summary: cough   Vital Signs:  Patient profile:   70 year old female Height:      64.5 inches Weight:      189 pounds O2 Sat:      96 % on Room air Temp:     98.2 degrees F oral Pulse rate:   72 / minute BP sitting:   131 / 66  (left arm) Cuff size:   regular  Vitals Entered By: Kathlene November (April 06, 2010 10:48 AM)  O2 Flow:  Room air  Serial Vital Signs/Assessments:                                PEF    PreRx  PostRx Time      O2 Sat  O2 Type     L/min  L/min  L/min   By 12:39 PM                      250    300    220     Andrea McCrimmon CMA, (AAMA)  Comments: 12:39 PM yellow range By: Avon Gully CMA, (AAMA)   CC: cough, not sleeping due to cough, chest congestion- going on since 8/8   Primary Care Provider:  Seymour Bars DO  CC:  cough, not sleeping due to cough, and chest congestion- going on since 8/8.  History of Present Illness: 70 yo WF presents for worsening cough and chest congestion x 3 days.  She has hx of COPD but has not seen Dr Steele Berg at [facility] since last Nov.  She has had several flare ups this year.  She is on Advair Diskus two times a day, DuoNebs 4 x a day, Hycodan at night.  She is still coughing at night even w/ Hycodan syrup.  She has increased sputum and DOE.  Denies chest pain.  She is using Mucinex 1 tab two times a day.  1 month ago, she was treated for COPD exac with Rocephin and Zithromax.    She has supposed to have nighttime O2 thru [facility] but for some reason, she is no longer using this.    Current Medications (verified): 1)  Advair Diskus 100-50 Mcg/dose Misc (Fluticasone-Salmeterol) .Marland Kitchen.. 1 Puff Bid 2)  Duoneb 0.5-2.5 (3) Mg/14ml Soln (Ipratropium-Albuterol) .... 3 Ml Nebs Qid 3)  Aspir-Low 81 Mg Tbec (Aspirin) .... By Mouth Daily 4)  Norvasc 5 Mg Tabs (Amlodipine Besylate) .Marland Kitchen.. 1  Tab By Mouth Once Day 5)  Citalopram Hydrobromide 40 Mg Tabs (Citalopram Hydrobromide) .Marland Kitchen.. 1 Tab By Mouth Once Daily 6)  Toprol Xl 100 Mg  Xr24h-Tab (Metoprolol Succinate) .Marland Kitchen.. 1 Tab By Mouth Daily 7)  Hydrocodone-Homatropine 5-1.5 Mg/49ml Syrp (Hydrocodone-Homatropine) .... 5 Ml By Mouth Q 6 Hrs As Needed Cough 8)  Proair Hfa 108 (90 Base) Mcg/act Aers (Albuterol Sulfate) .... 2 Puffs Q 4-6 Hrs Prn  Allergies (verified): 1)  ! Codeine 2)  * Altace  Comments:  Nurse/Medical Assistant: The patient's medications and allergies were reviewed with the patient and were updated in the Medication and Allergy Lists. Kathlene November (April 06, 2010 10:50 AM)  Past History:  Past Medical History: Reviewed history from 08/27/2008 and no changes required. [id number] Left hip pain Diverticulosis, colon 1 cm L adrenal adenoma (CT done by Dr Zachery Conch 12-07) stress and urge urinary incontinence-- may go for [facility] mid-urethral sling surgery cath 4-08 normal COPD/ lung nodule -- Dr Steele Berg  HTN hyperlipidemia CAD-- Dr Lorenso Courier , [facility] cardiology  Past Surgical History: Reviewed history from 10/03/2006 and no changes required. [place] mid urethral sling done by Dr Elvera Lennox Myer Peer  Social History: Reviewed history from 08/27/2008 and no changes required. Retired.  Lives with husband Onalee Hua.  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  Having trouble with her son who is abusing perscription meds.  Walks 1 hrs daily.  Sexually active.  Quit smoking in 12-09.  Review of Systems      See HPI  Physical Exam  General:  alert, well-developed, well-nourished, and well-hydrated.   Head:  normocephalic and atraumatic.   Eyes:  conjunctiva clear Nose:  no nasal discharge.   Mouth:  pharynx pink and moist.   Neck:  no masses.   Lungs:  diffuse rhonchi.  mildly labored.  No accesory muscle use.  No crackles,  exp wheeze is present. Heart:  Normal rate and regular rhythm. S1 and S2 normal without gallop, murmur, click, rub or other extra sounds. Extremities:  no LE edema Skin:  color normal.   Cervical Nodes:  No lymphadenopathy noted Psych:  good eye contact, not  anxious appearing, and not depressed appearing.     Impression & Recommendations:  Problem # 1:  BRONCHITIS, CHRONIC, ACUTE EXACERBATION (ICD-491.21) Acute flare of chronic bronchitis.  Increased frequency of COPD exacerbations this year (after quitting smoking). Will get a CXR to see if she needs another round of abx (just completed a round 1 mo ago). Treat with 12 day Prednisone taper, DuoNebs 4 x a day, Advair two times a day (work in improving compliance).  Changed RX cough syrup to Tussionex.  Will get [employer] back out there to check home O2 levels for supplemental O2 needs. Pulse ox is reassuring.  PFs in yellow zone.  Increase Mucinex to 2 tabs by mouth two times a day.  Will get her back into Dr Steele Berg for further assistance. Orders: Home Health Referral (Home Health) Peak Flow Rate (94150)  Complete Medication List: 1)  Advair Diskus 100-50 Mcg/dose Misc (Fluticasone-salmeterol) .Marland Kitchen.. 1 puff bid 2)  Duoneb 0.5-2.5 (3) Mg/31ml Soln (Ipratropium-albuterol) .... 3 ml nebs qid 3)  Aspir-low 81 Mg Tbec (Aspirin) .... By mouth daily 4)  Norvasc 5 Mg Tabs (Amlodipine besylate) .Marland Kitchen.. 1  tab by mouth once day 5)  Citalopram Hydrobromide 40 Mg Tabs (Citalopram hydrobromide) .Marland Kitchen.. 1 tab by mouth once daily 6)  Toprol Xl 100 Mg Xr24h-tab (Metoprolol succinate) .Marland Kitchen.. 1 tab by mouth daily 7)  Tussionex Pennkinetic Er 8-10 Mg/12ml Lqcr (Chlorpheniramine-hydrocodone) .... 5 ml by mouth q 12 hrs as needed cough 8)  Proair Hfa 108 (90 Base) Mcg/act Aers (Albuterol sulfate) .... 2 puffs q 4-6 hrs prn 9)  Prednisone 20 Mg Tabs (Prednisone) .... 3 tabs by mouth once a day x 3 days then 2 tab by mouth once a day x 3 days then 1 tab by mouth once a day x 3 days then 1/2 tab by mouth once daily x 3 days 10)  Mucinex 600 Mg Xr12h-tab (Guaifenesin) .... 2 tabs by mouth q 12 hrs  Other Orders: T-DG Chest 2 View [phone])  Patient Instructions: 1)  CXR today. 2)  Will call you w/ results this afternoon. 3)   Start Prednisone taper x 12 days. 4)  Use OTC plain Mucinex 2 tabs every 12 hrs. 5)  Use DuoNebs 4 x a day. 6)  Use Advair 2 x a day. 7)  use Hycodan at bedtime.   8)  Will  get you back in with Dr Steele Berg at [facility]. 9)  Will have [employer] eval nocturnal O2 sat for nightime oxygen. Prescriptions: TUSSIONEX PENNKINETIC ER 8-10 MG/5ML LQCR (CHLORPHENIRAMINE-HYDROCODONE) 5 ml by mouth q 12 hrs as needed cough  #120 ml x 0   Entered and Authorized by:   Seymour Bars DO   Signed by:   Seymour Bars DO on 04/06/2010   Method used:   Printed then faxed to ...       [facility]  [address] [phone]* (retail)       [address] [place], [place]  [address]       Ph: [phone] or [phone]       Fax: [phone]   RxID:   [id number] PREDNISONE 20 MG TABS (PREDNISONE) 3 tabs by mouth once a day x 3 days then 2 tab by mouth once a day x 3 days then 1 tab by mouth once a day x 3 days then 1/2 tab by mouth once daily x 3 days  #20 tabs x 0   Entered and Authorized by:   Seymour Bars DO   Signed by:   Seymour Bars DO on 04/06/2010   Method used:   Printed then faxed to ...       [facility]  [address] [phone]* (retail)       [address] [place], [place]  [address]       Ph: [phone] or [phone]       Fax: [phone]   RxID:   [id number]

## 2010-09-21 NOTE — Progress Notes (Signed)
Summary: Requesting cough med  Phone Note Refill Request Message from:  Patient on December 09, 2009 1:14 PM  Refills Requested: Medication #1:  HYDROCODONE-HOMATROPINE 5-1.5 MG/5ML SYRP 5 ml by mouth at bedtime as needed cough Pt LMOM stating she really needs this cough med bc the pollen is very bad and is causing cough. Please advise.  Initial call taken by: Payton Spark CMA,  December 09, 2009 1:14 PM    Prescriptions: HYDROCODONE-HOMATROPINE 5-1.5 MG/5ML SYRP (HYDROCODONE-HOMATROPINE) 5 ml by mouth at bedtime as needed cough  #100 ml x 0   Entered and Authorized by:   Seymour Bars DO   Signed by:   Seymour Bars DO on 12/09/2009   Method used:   Telephoned to ...       [facility]  [address] [phone]* (retail)       [address] [place], [place]  [address]       Ph: [phone] or [phone]       Fax: [phone]   RxID:   [id number]   Appended Document: Requesting cough med Called into pharm

## 2010-09-21 NOTE — Assessment & Plan Note (Signed)
Summary: COPD exac/ BP   Vital Signs:  Patient profile:   70 year old female Height:      64.5 inches Weight:      187 pounds BMI:     31.72 O2 Sat:      97 % on Room air Temp:     98.0 degrees F oral Pulse rate:   78 / minute BP sitting:   150 / 90  (right arm) Cuff size:   regular  Vitals Entered By: Duard Brady LPN (February 23, 1609 9:50 AM)  O2 Flow:  Room air CC: c/o elevated BP over the weekend - congested cough Is Patient Diabetic? No   Primary Care Provider:  Seymour Bars DO  CC:  c/o elevated BP over the weekend - congested cough.  History of Present Illness: 70 yo WF presents for 2 wks ago of worsening in cough. She has COPD, no longer smokes and has not been consistently using her Advair or Albuterol nebs.  Her husband still smokes.  Denies preceeding allergies or URIs.  It began where she could not focus with her eyes followed by a bad HA.  Her BP has been running high even on her meds.  Her highest reading was 170/90.  She has a lot of chest tightness.  Denies feeling as SOB.  Has white sputum production and a cough that keeps her up.    Habits & Providers  Alcohol-Tobacco-Diet     Tobacco Status: quit  Allergies: 1)  ! Codeine 2)  * Altace  Past History:  Past Medical History: Reviewed history from 08/27/2008 and no changes required. [id number] Left hip pain Diverticulosis, colon 1 cm L adrenal adenoma (CT done by Dr Zachery Conch 12-07) stress and urge urinary incontinence-- may go for [facility] mid-urethral sling surgery cath 4-08 normal COPD/ lung nodule -- Dr Steele Berg HTN hyperlipidemia CAD-- Dr Lorenso Courier , [facility] cardiology  Past Surgical History: Reviewed history from 10/03/2006 and no changes required. Lynx mid urethral sling done by Dr Elvera Lennox Myer Peer  Social History: Reviewed history from 08/27/2008 and no changes required. Retired.  Lives with husband Onalee Hua.  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  Having trouble with her son who is abusing perscription meds.   Walks 1 hrs daily.  Sexually active.  Quit smoking in 12-09.  Review of Systems      See HPI  Physical Exam  General:  alert, well-developed, well-nourished, and well-hydrated.   Head:  normocephalic and atraumatic.   Eyes:  conjunctiva clear Nose:  no nasal discharge.   Mouth:  pharynx pink and moist.   Neck:  no masses.   Chest Wall:  anterior chest wall TTP Lungs:  diffuse expiratory wheezing w/o rhonchi or crackles.  prolonged exp phase with mild tachypnea on exertion Heart:  Normal rate and regular rhythm. S1 and S2 normal without gallop, murmur, click, rub or other extra sounds. Extremities:  no LE edema Skin:  color normal.   Cervical Nodes:  No lymphadenopathy noted Psych:  good eye contact, not anxious appearing, and not depressed appearing.     Impression & Recommendations:  Problem # 1:  CHRONIC OBSTRUCTIVE PULMONARY DISEASE, ACUTE EXACERBATION (ICD-491.21)  Clinically, she appears to be having COPD exacerbation. Will treat with Rocephin 1 g IM x 1 + 5 days of Zithromax.  Prednisone 12 day taper 01-24-19-10, Advair sample 250/50 1 puff two times a day (had the 100/50 in the past) and changed home neb medicine to DUONEB to use 4 x a day until  f/u in 10 days.  RFd RX cough syrup.  Call if not improving or getting worse.  O/W f/u in 10 days.  Orders: Rocephin  250mg  (Q0347) Admin of Therapeutic Inj  intramuscular or subcutaneous (42595)  Problem # 2:  HYPERTENSION, BENIGN SYSTEMIC (ICD-401.1) BP running high, likely due to increased resp effort.  For now, will increase her Norvasc from 5--> 10 mg/ day.  REcheck in 10 days at f/u visit. Her updated medication list for this problem includes:    Norvasc 5 Mg Tabs (Amlodipine besylate) .Marland Kitchen... 2 tabs by mouth once day    Toprol Xl 100 Mg Xr24h-tab (Metoprolol succinate) .Marland Kitchen... 1 tab by mouth daily  BP today: 150/90 Prior BP: 131/81 (12/12/2009)  Labs Reviewed: K+: 4.9 (07/02/2009) Creat: : 0.98 (07/02/2009)   Chol: 203  (07/02/2009)   HDL: 43 (07/02/2009)   LDL: 121 (07/02/2009)   TG: 197 (07/02/2009)  Complete Medication List: 1)  Advair Diskus 100-50 Mcg/dose Misc (Fluticasone-salmeterol) .Marland Kitchen.. 1 puff bid 2)  Duoneb 0.5-2.5 (3) Mg/22ml Soln (Ipratropium-albuterol) .... 3 ml nebs qid 3)  Aspir-low 81 Mg Tbec (Aspirin) .... By mouth daily 4)  Norvasc 5 Mg Tabs (Amlodipine besylate) .... 2 tabs by mouth once day 5)  Citalopram Hydrobromide 40 Mg Tabs (Citalopram hydrobromide) .Marland Kitchen.. 1 tab by mouth once daily 6)  Toprol Xl 100 Mg Xr24h-tab (Metoprolol succinate) .Marland Kitchen.. 1 tab by mouth daily 7)  Hydrocodone-homatropine 5-1.5 Mg/63ml Syrp (Hydrocodone-homatropine) .... 5 ml by mouth q 6 hrs as needed cough 8)  Prednisone 20 Mg Tabs (Prednisone) .... 3 tabs by mouth qam x 3 days then 2 tabs by mouth qam x 3 days then 1 tab by mouth qam x 3 days then 1/2 tab by mouth qam x 3 days 9)  Zithromax Z-pak 250 Mg Tabs (Azithromycin) .... Take as directed  Patient Instructions: 1)  for COPD exacerbation: 2)  Use ADVAIR sample 1 puff 2 x a day (higher dose). 3)  Use DUONEB 3-4 x a day everyday for 10 days. 4)  Rocephin injejction today + Zithromax x 5 days (antibiotics). 5)  Take Prednisone taper for 12 days. 6)  Call if not improving. 7)  Use Rx cough syrup as needed. 8)  REturn to recheck in 10 days. 9)  Use 2 tabs of Norvasc once a day for high BPs. Prescriptions: ZITHROMAX Z-PAK 250 MG TABS (AZITHROMYCIN) take as directed  #1 pack x 0   Entered and Authorized by:   Seymour Bars DO   Signed by:   Seymour Bars DO on 02/23/2010   Method used:   [facility]        [facility] [phone]* (retail)       [address] [place], [place]  [address]       Ph: [phone] or [phone]       Fax: [phone]   RxID:   [id number] PREDNISONE 20 MG TABS (PREDNISONE) 3 tabs by mouth qAM x 3 days then 2 tabs by mouth qAM x 3 days then 1 tab by mouth qAM x 3 days then 1/2 tab by mouth qAM x 3 days  #20 tabs x 0    Entered and Authorized by:   Seymour Bars DO   Signed by:   Seymour Bars DO on 02/23/2010   Method used:   [facility]ronically to        [facility] [phone]* (retail)  [address]       [place], [place]  [address]       Ph: [phone] or [phone]       Fax: [phone]   RxID:   [id number] HYDROCODONE-HOMATROPINE 5-1.5 MG/5ML SYRP (HYDROCODONE-HOMATROPINE) 5 ml by mouth q 6 hrs as needed cough  #250 ml x 0   Entered and Authorized by:   Seymour Bars DO   Signed by:   Seymour Bars DO on 02/23/2010   Method used:   Printed then faxed to ...       [facility]  [address] [phone]* (retail)       [address] [place], [place]  [address]       Ph: [phone] or [phone]       Fax: [phone]   RxID:   [id number] DUONEB 0.5-2.5 (3) MG/3ML SOLN (IPRATROPIUM-ALBUTEROL) 3 ml NEBS QID  #1 box x 3   Entered and Authorized by:   Seymour Bars DO   Signed by:   Seymour Bars DO on 02/23/2010   Method used:   [facility]ronically to        [facility] [phone]* (retail)       [address]       [place], [place]  [address]       Ph: [phone] or [phone]       Fax: [phone]   RxID:   [id number]    Medication Administration  Injection # 1:    Medication: Rocephin  250mg     Diagnosis: COPD (ICD-496)    Route: IM    Site: RUOQ gluteus    Exp Date: 07/2012    Lot #: [id number]    Mfr: novaplus    Comments: 1gm given    Patient tolerated injection without complications    Given by: Duard Brady LPN (February 24, 9380 10:54 AM)  Orders Added: 1)  Rocephin  250mg  [J0696] 2)  Admin of Therapeutic Inj  intramuscular or subcutaneous [96372] 3)  Est. Patient Level IV [01751]   Appended Document: COPD exac/ BP hydrocodone-homatropine faxed and confirmed. kik

## 2010-09-21 NOTE — Progress Notes (Signed)
Summary: Requests of cough med  Phone Note Refill Request Message from:  Patient on Jan 08, 2010 9:21 AM  Refills Requested: Medication #1:  HYDROCODONE-HOMATROPINE 5-1.5 MG/5ML SYRP 5 ml by mouth at bedtime as needed cough Pt is going out of town and her allergies are really bothering her bc of the weather. Pt would like refill on cough med but for # .  Initial call taken by: Payton Spark CMA,  Jan 08, 2010 9:21 AM    Prescriptions: HYDROCODONE-HOMATROPINE 5-1.5 MG/5ML SYRP (HYDROCODONE-HOMATROPINE) 5 ml by mouth at bedtime as needed cough  #150 ml x 0   Entered and Authorized by:   Seymour Bars DO   Signed by:   Seymour Bars DO on 01/08/2010   Method used:   Printed then faxed to ...       [facility]  [address] [phone]* (retail)       [address] [place], [place]  [address]       Ph: [phone] or [phone]       Fax: [phone]   RxID:   [id number]

## 2010-09-21 NOTE — Consult Note (Signed)
Summary: [facility]  [facility]   Imported By: Lanelle Bal 05/17/2010 12:05:25  _____________________________________________________________________  External Attachment:    Type:   Image     Comment:   External Document

## 2010-09-21 NOTE — Assessment & Plan Note (Signed)
Summary: f/u COPD   Vital Signs:  Patient profile:   70 year old female Height:      64.5 inches Weight:      192 pounds BMI:     32.57 O2 Sat:      95 % on Room air Pulse rate:   71 / minute BP sitting:   115 / 75  (left arm) Cuff size:   regular  Vitals Entered By: Payton Spark CMA (June 07, 2010 2:04 PM)  O2 Flow:  Room air CC: F/U COPD.    Primary Care Provider:  Seymour Bars DO  CC:  F/U COPD. Marland Kitchen  History of Present Illness: 70 yo WF presents for f/u visit.  she saw Dr Steele Berg last month at [facility]  He did an Echo and PFts but we do not have results.  She  is due f/u with him.  She was treated with a month of Zithromax.  She is not using her Advair and has stopped her Mucinex.  She still has a cough day and night.  We did an overnight oximetry on her and it looks like she qualifies for O2.  I enquired about a sleep study due to her desat events.  She had one in 07 that was + only for restless legs.  I did start her on Requip which she is taking at night.  She doesn't notice any changes.  She is still requesting narcotic cough syrup which she has taken now for about a year.  I told her last visit, that it was up to Dr Steele Berg whether to keep her on this or not.  She is currently only using her Duo Nebs 2 x a wk.    Denies change in wt but has noticed an increase in sweating, day and night x 3-4 mos.  She had a CXR done 03-2010.  Current Medications (verified): 1)  Advair Diskus 100-50 Mcg/dose Misc (Fluticasone-Salmeterol) .Marland Kitchen.. 1 Puff Bid 2)  Duoneb 0.5-2.5 (3) Mg/50ml Soln (Ipratropium-Albuterol) .... 3 Ml Nebs Qid 3)  Aspir-Low 81 Mg Tbec (Aspirin) .... By Mouth Daily 4)  Norvasc 5 Mg Tabs (Amlodipine Besylate) .Marland Kitchen.. 1  Tab By Mouth Once Day 5)  Citalopram Hydrobromide 40 Mg Tabs (Citalopram Hydrobromide) .Marland Kitchen.. 1 Tab By Mouth Once Daily 6)  Toprol Xl 100 Mg Xr24h-Tab (Metoprolol Succinate) .Marland Kitchen.. 1 Tab By Mouth Daily 7)  Proair Hfa 108 (90 Base) Mcg/act Aers (Albuterol  Sulfate) .... 2 Puffs Q 4-6 Hrs Prn  Allergies (verified): 1)  ! Codeine 2)  * Altace  Past History:  Past Medical History: Reviewed history from 08/27/2008 and no changes required. [id number] Left hip pain Diverticulosis, colon 1 cm L adrenal adenoma (CT done by Dr Zachery Conch 12-07) stress and urge urinary incontinence-- may go for [facility] mid-urethral sling surgery cath 4-08 normal COPD/ lung nodule -- Dr Steele Berg HTN hyperlipidemia CAD-- Dr Lorenso Courier , [facility] cardiology  Past Surgical History: Reviewed history from 10/03/2006 and no changes required. Lynx mid urethral sling done by Dr Elvera Lennox Myer Peer  Social History: Reviewed history from 08/27/2008 and no changes required. Retired.  Lives with husband Onalee Hua.  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  Having trouble with her son who is abusing perscription meds.  Walks 1 hrs daily.  Sexually active.  Quit smoking in 12-09.  Review of Systems      See HPI  Physical Exam  General:  alert, well-developed, well-nourished, well-hydrated, and overweight-appearing.   Head:  normocephalic and atraumatic.   Eyes:  conjunctiva  clear ; wears glasses Nose:  no nasal discharge.   Mouth:  pharynx pink and moist.   Neck:  no masses.   Lungs:  diffuse rhonchi.  mildly labored.  No accesory muscle use.  No crackles,  exp wheeze is present. Heart:  Normal rate and regular rhythm. S1 and S2 normal without gallop, murmur, click, rub or other extra sounds. Extremities:  no LE edema Skin:  color normal.   Cervical Nodes:  No lymphadenopathy noted Psych:  good eye contact, not anxious appearing, and not depressed appearing.     Impression & Recommendations:  Problem # 1:  COPD (ICD-496) Non compliance with treatment.  I will send a note back to Dr Steele Berg that 1) she is due for follow - up visit 2) she continues to request narcotic cough syrup at night for the past year -- if he wants her to stay on this, he can write her RX 3) She is not using her ADvair 4) She  had nighttime hypoxemia with last sleep study 2007-- she may need to repeat this and if she also has pulm HTN on her Echo, I'd recommend starting nighttime O2 5) had spirometry repeated there -- due to f/u the results.  Reports that she had her flu shot this year.   The following medications were removed from the medication list:    Advair Diskus 100-50 Mcg/dose Misc (Fluticasone-salmeterol) .Marland Kitchen... 1 puff bid Her updated medication list for this problem includes:    Duoneb 0.5-2.5 (3) Mg/78ml Soln (Ipratropium-albuterol) .Marland KitchenMarland KitchenMarland KitchenMarland Kitchen 3 ml nebs qid    Proair Hfa 108 (90 Base) Mcg/act Aers (Albuterol sulfate) .Marland Kitchen... 2 puffs q 4-6 hrs prn  Problem # 2:  RESTLESS LEGS SYNDROME (ICD-333.94) Present on sleep study from 07.  On Requip.  Continue.  Problem # 3:  SWEATING (ICD-780.8) Will check blood counts and TSH with labs that are due next month.  She had a CXR 03-2010.  Consider CT chest to further r/o lung malignancy given scarring on this CXR.  Notably, she has not had hemoptysis, wt loss or increased SOB. Orders: T-TSH ([id number])  Complete Medication List: 1)  Duoneb 0.5-2.5 (3) Mg/67ml Soln (Ipratropium-albuterol) .... 3 ml nebs qid 2)  Aspir-low 81 Mg Tbec (Aspirin) .... By mouth daily 3)  Norvasc 5 Mg Tabs (Amlodipine besylate) .Marland Kitchen.. 1  tab by mouth once day 4)  Citalopram Hydrobromide 40 Mg Tabs (Citalopram hydrobromide) .Marland Kitchen.. 1 tab by mouth once daily 5)  Toprol Xl 100 Mg Xr24h-tab (Metoprolol succinate) .Marland Kitchen.. 1 tab by mouth daily 6)  Proair Hfa 108 (90 Base) Mcg/act Aers (Albuterol sulfate) .... 2 puffs q 4-6 hrs prn 7)  Requip 0.5 Mg Tabs (Ropinirole hcl) .Marland Kitchen.. 1 tab by mouth at bedtime  Other Orders: T-CBC w/Diff ([id number]) T-Comprehensive Metabolic Panel [id number]) T-Lipid Profile [id number])  Patient Instructions: 1)  Update labs after Nov 11th. 2)  Stay on REquip. 3)  I will get you back in with Dr Steele Berg and send him a copy of your overnight oximetry to coordinate with your  echocardiogram results. 4)  Return for follow up in 6 mos. Prescriptions: REQUIP 0.5 MG TABS (ROPINIROLE HCL) 1 tab by mouth at bedtime  #30 x 3   Entered and Authorized by:   Seymour Bars DO   Signed by:   Seymour Bars DO on 06/07/2010   Method used:   [facility]ronically to        [facility]  [address] [phone]* (retail)       [phone]  [address]       [place], [place]  [address]       Ph: [phone] or [phone]       Fax: [phone]   RxID:   [id number]    Orders Added: 1)  T-CBC w/Diff [[id number]] 2)  T-TSH [[id number]] 3)  T-Comprehensive Metabolic Panel [[id number]] 4)  T-Lipid Profile [[id number]] 5)  Est. Patient Level IV [[address]]

## 2010-09-21 NOTE — Procedures (Signed)
Summary: Oximetry/[facility]  Oximetry/[facility]   Imported By: Lanelle Bal 04/22/2010 08:27:48  _____________________________________________________________________  External Attachment:    Type:   Image     Comment:   External Document

## 2010-09-21 NOTE — Progress Notes (Signed)
Summary: Cough med  Phone Note Call from Patient   Caller: Patient Summary of Call: Pt LMOM stating Tussionex is not working well and would like refill on prior cough med.  Initial call taken by: Payton Spark CMA,  April 23, 2010 9:48 AM  Follow-up for Phone Call        cannot fill new one till 51-16. she has the option to follow with pulm for chronic cough. Follow-up by: Seymour Bars DO,  April 23, 2010 10:01 AM     Appended Document: Cough med Pt aware of the above

## 2010-09-21 NOTE — Miscellaneous (Signed)
Summary: Immunization Entry   Immunization History:  Influenza Immunization History:    Influenza:  historical (05/21/2010)

## 2010-09-21 NOTE — Letter (Signed)
Summary: Revised CMN for [facility]  Revised CMN for [facility]   Imported By: Lanelle Bal 02/24/2010 09:09:54  _____________________________________________________________________  External Attachment:    Type:   Image     Comment:   External Document

## 2010-09-21 NOTE — Letter (Signed)
Summary: CMN for [facility]  CMN for [facility]   Imported By: Lanelle Bal 02/23/2010 09:57:30  _____________________________________________________________________  External Attachment:    Type:   Image     Comment:   External Document

## 2010-09-21 NOTE — Assessment & Plan Note (Signed)
Summary: f/u copd exacerbation   Vital Signs:  Patient profile:   70 year old female Height:      64.5 inches Weight:      189 pounds BMI:     32.06 O2 Sat:      95 % on Room air Pulse rate:   80 / minute BP sitting:   125 / 74  (left arm) Cuff size:   regular  Vitals Entered By: Payton Spark CMA (March 05, 2010 10:06 AM)  O2 Flow:  Room air CC: F/U COPD   Primary Care Provider:  Seymour Bars DO  CC:  F/U COPD.  History of Present Illness: 70 yo WF presents for f/u COPD exacerbation from 10 days ago.  She finished out her Prednisone taper,  Took Rocephin and Zithromax.  She is using DuoNebs down to 3 x a day.  She is using the Advair 250/ 50 two times a day.  She is starting to cough up some phlegm now.  Her SOB and cough has improved.    She is back down to the 5 mg tabs of her Norvasc.      Current Medications (verified): 1)  Advair Diskus 100-50 Mcg/dose Misc (Fluticasone-Salmeterol) .Marland Kitchen.. 1 Puff Bid 2)  Duoneb 0.5-2.5 (3) Mg/49ml Soln (Ipratropium-Albuterol) .... 3 Ml Nebs Qid 3)  Aspir-Low 81 Mg Tbec (Aspirin) .... By Mouth Daily 4)  Norvasc 5 Mg Tabs (Amlodipine Besylate) .... 2 Tabs By Mouth Once Day 5)  Citalopram Hydrobromide 40 Mg Tabs (Citalopram Hydrobromide) .Marland Kitchen.. 1 Tab By Mouth Once Daily 6)  Toprol Xl 100 Mg Xr24h-Tab (Metoprolol Succinate) .Marland Kitchen.. 1 Tab By Mouth Daily 7)  Hydrocodone-Homatropine 5-1.5 Mg/85ml Syrp (Hydrocodone-Homatropine) .... 5 Ml By Mouth Q 6 Hrs As Needed Cough  Allergies (verified): 1)  ! Codeine 2)  * Altace  Past History:  Past Medical History: Reviewed history from 08/27/2008 and no changes required. [id number] Left hip pain Diverticulosis, colon 1 cm L adrenal adenoma (CT done by Dr Zachery Conch 12-07) stress and urge urinary incontinence-- may go for [facility] mid-urethral sling surgery cath 4-08 normal COPD/ lung nodule -- Dr Steele Berg HTN hyperlipidemia CAD-- Dr Lorenso Courier , [facility] cardiology  Social History: Reviewed history from 08/27/2008  and no changes required. Retired.  Lives with husband Onalee Hua.  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  Having trouble with her son who is abusing perscription meds.  Walks 1 hrs daily.  Sexually active.  Quit smoking in 12-09.  Review of Systems      See HPI  Physical Exam  General:  alert, well-developed, well-nourished, and well-hydrated.   Head:  normocephalic and atraumatic.   Mouth:  pharynx pink and moist.   Neck:  no masses.   Lungs:  normal respiratory effort.  bibasilar rhonchi with faint exp wheezing.  nonlabored.  no accessory muscle use.  Improved aeration Heart:  Normal rate and regular rhythm. S1 and S2 normal without gallop, murmur, click, rub or other extra sounds. Extremities:  no LE edema, no cyanosis Skin:  color normal.   Cervical Nodes:  No lymphadenopathy noted Psych:  good eye contact, not anxious appearing, and not depressed appearing.     Impression & Recommendations:  Problem # 1:  CHRONIC OBSTRUCTIVE PULMONARY DISEASE, ACUTE EXACERBATION (ICD-491.21) Assessment Improved About 75% improved from 10 days ago after treatment with Rocephin, Zithromax, Prednisone Taper, DuoNebs, Advair and RX cough meds. Cough, DOE much improved.  Improved aeration.  Still has some rhonchi and sputum production.  She is to start Mucinex OTC q  12 hrs a mucolytic, reserving RX cough syrup for bedtime only.  Can cut back to the Advair 100/50 2 x a day and will use DuoNebs 3-4 x a day.  For portability, I RX'd a ProAir HFA for as needed use.    RTC for flu vaccine in 3 mos.  Complete Medication List: 1)  Advair Diskus 100-50 Mcg/dose Misc (Fluticasone-salmeterol) .Marland Kitchen.. 1 puff bid 2)  Duoneb 0.5-2.5 (3) Mg/20ml Soln (Ipratropium-albuterol) .... 3 ml nebs qid 3)  Aspir-low 81 Mg Tbec (Aspirin) .... By mouth daily 4)  Norvasc 5 Mg Tabs (Amlodipine besylate) .Marland Kitchen.. 1  tab by mouth once day 5)  Citalopram Hydrobromide 40 Mg Tabs (Citalopram hydrobromide) .Marland Kitchen.. 1 tab by mouth once daily 6)  Toprol Xl  100 Mg Xr24h-tab (Metoprolol succinate) .Marland Kitchen.. 1 tab by mouth daily 7)  Hydrocodone-homatropine 5-1.5 Mg/61ml Syrp (Hydrocodone-homatropine) .... 5 ml by mouth q 6 hrs as needed cough 8)  Proair Hfa 108 (90 Base) Mcg/act Aers (Albuterol sulfate) .... 2 puffs q 4-6 hrs prn  Patient Instructions: 1)  COPD improving! 2)  Continue DuoNebs 3 x a day. 3)  Stay on Advair 1 puff 2 x a day. 4)  Use OTC Mucinex (plain) q 12 hrs for the next 7 days to help break loose chest congestion. 5)  Use RX cough syrup only at night. 6)  Use ProAir as your rescue inhaler. 7)  Return for f/u COPD / mood / flu shot in 3 mos. Prescriptions: PROAIR HFA 108 (90 BASE) MCG/ACT AERS (ALBUTEROL SULFATE) 2 puffs q 4-6 hrs prn  #1 x 1   Entered and Authorized by:   Seymour Bars DO   Signed by:   Seymour Bars DO on 03/05/2010   Method used:   [facility] [phone]* (retail)       [address]       [place], [place]  [address]       Ph: [phone] or [phone]       Fax: [phone]   RxID:   [id number]

## 2010-09-21 NOTE — Progress Notes (Signed)
Summary: Metoprolol Rx  Phone Note Refill Request Message from:  Patient on August 24, 2009 1:44 PM  Refills Requested: Medication #1:  TOPROL XL 100 MG XR24H-TAB 1 tab by mouth daily Initial call taken by: Payton Spark CMA,  August 24, 2009 1:44 PM    Prescriptions: TOPROL XL 100 MG XR24H-TAB (METOPROLOL SUCCINATE) 1 tab by mouth daily  #90 x 1   Entered by:   Payton Spark CMA   Authorized by:   Seymour Bars DO   Signed by:   Payton Spark CMA on 08/24/2009   Method used:   [facility]        [facility] [phone]* (retail)       [address]       [place], [place]  [address]       Ph: [phone] or [phone]       Fax: [phone]   RxID:   [id number]

## 2010-09-21 NOTE — Miscellaneous (Signed)
Summary: [facility]  Clinical Lists Changes  Medications: Changed medication from FEXOFENADINE HCL 180 MG TABS (FEXOFENADINE HCL) 1 tab by mouth daily to FEXOFENADINE HCL 180 MG TABS (FEXOFENADINE HCL) 1 tab by mouth daily - Signed Rx of FEXOFENADINE HCL 180 MG TABS (FEXOFENADINE HCL) 1 tab by mouth daily;  #90 x 2;  Signed;  Entered by: Seymour Bars DO;  Authorized by: Seymour Bars DO;  Method used: [facility] [address] [place], [place], [place]  [address], Ph: [phone] or [phone], Fax: [phone]    Prescriptions: FEXOFENADINE HCL 180 MG TABS (FEXOFENADINE HCL) 1 tab by mouth daily  #90 x 2   Entered and Authorized by:   Seymour Bars DO   Signed by:   Seymour Bars DO on 12/12/2008   Method used:   [facility]ronically [facility]        [facility] [phone][facility] (retail)       [address]       [place], [place]  [address]       Ph: [phone] or [phone]       Fax: [phone]   RxID:   [id number]

## 2010-09-21 NOTE — Assessment & Plan Note (Signed)
Summary: COPD exac   Vital Signs:  Patient profile:   69 year old female Height:      64.5 inches Weight:      192 pounds BMI:     32.57 O2 Sat:      97 % on Room air Temp:     98.3 degrees F oral Pulse rate:   71 / minute BP sitting:   148 / 78  (left arm) Cuff size:   large  Vitals Entered By: Payton Spark CMA (July 20, 2010 10:23 AM)  O2 Flow:  Room air CC: cough, congestion, ST, and ear pain x 3 days.   Primary Care Olive Zmuda:  Seymour Bars DO  CC:  cough, congestion, ST, and and ear pain x 3 days.Marland Kitchen  History of Present Illness: 70 yo WF presents for a cold that started about 4 days ago.  Started with a sore throat, congestion and now cough and ear pressure.  Having headaches and bodyaches.  Had a flu shot .  + subjective fevers last night.  Has nausea but no vomitting.  Has diarrhea.   She is taking Mucinex and Tylenol.  No known sick contacts.  She has chest tightness and SOB.  Her cough is mostly dry.  She has a previous dx of moderate COPD, sees Dr Steele Berg - on DuoNebs as needed only.  Quit smoking in 09.  Current Medications (verified): 1)  Duoneb 0.5-2.5 (3) Mg/11ml Soln (Ipratropium-Albuterol) .... 3 Ml Nebs Qid 2)  Aspir-Low 81 Mg Tbec (Aspirin) .... By Mouth Daily 3)  Norvasc 5 Mg Tabs (Amlodipine Besylate) .Marland Kitchen.. 1  Tab By Mouth Once Day 4)  Citalopram Hydrobromide 40 Mg Tabs (Citalopram Hydrobromide) .Marland Kitchen.. 1 Tab By Mouth Once Daily 5)  Toprol Xl 100 Mg Xr24h-Tab (Metoprolol Succinate) .Marland Kitchen.. 1 Tab By Mouth Daily 6)  Proair Hfa 108 (90 Base) Mcg/act Aers (Albuterol Sulfate) .... 2 Puffs Q 4-6 Hrs Prn 7)  Requip 0.5 Mg Tabs (Ropinirole Hcl) .Marland Kitchen.. 1 Tab By Mouth At Bedtime  Allergies (verified): 1)  ! Codeine 2)  * Altace  Past History:  Past Medical History: Reviewed history from 08/27/2008 and no changes required. [id number] Left hip pain Diverticulosis, colon 1 cm L adrenal adenoma (CT done by Dr Zachery Conch 12-07) stress and urge urinary incontinence-- may go  for [facility] mid-urethral sling surgery cath 4-08 normal COPD/ lung nodule -- Dr Steele Berg HTN hyperlipidemia CAD-- Dr Lorenso Courier , [facility] cardiology  Past Surgical History: Reviewed history from 10/03/2006 and no changes required. Lynx mid urethral sling done by Dr Elvera Lennox Myer Peer  Social History: Reviewed history from 08/27/2008 and no changes required. Retired.  Lives with husband Onalee Hua.  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  Having trouble with her son who is abusing perscription meds.  Walks 1 hrs daily.  Sexually active.  Quit smoking in 12-09.  Review of Systems General:  Complains of fatigue and fever; denies chills, weakness, and weight loss. ENT:  Complains of earache, nasal congestion, postnasal drainage, sinus pressure, and sore throat. CV:  Complains of shortness of breath with exertion; denies chest pain or discomfort, palpitations, and swelling of feet. Resp:  Complains of cough and wheezing; denies coughing up blood, pleuritic, and sputum productive. GI:  Complains of diarrhea, loss of appetite, and nausea; denies abdominal pain.  Physical Exam  General:  alert, well-developed, well-nourished, well-hydrated, and overweight-appearing.  in NAD Head:  normocephalic and atraumatic.  sinuses NTTP Eyes:  conjunctiva clear Ears:  EACs patent; TMs translucent and gray with good  cone of light and bony landmarks.  Nose:  nasal congestion with boggy turbinates present Mouth:  pharynx pink and moist.  o/p injected Neck:  no masses.   Chest Wall:  no tenderness.   Lungs:  dry hacking cough with diffuse exp wheezing, rhonchi with cough Heart:  Normal rate and regular rhythm. S1 and S2 normal without gallop, murmur, click, rub or other extra sounds. Extremities:  no LE edema Skin:  color normal and no rashes.   Cervical Nodes:  No lymphadenopathy noted   Impression & Recommendations:  Problem # 1:  CHRONIC OBSTRUCTIVE PULMONARY DISEASE, ACUTE EXACERBATION (ICD-491.21)  Will treat COPD  exacerbation secondary to viral URI with: Rocephin 1 gram IM + 5 days oral Zithromax Prednisone 60 mg once daily x 5 days DuoNebs 4 x a day x 1 wk, then move to as needed. RX cough syrup at night with Mucinex DM + Tylenol in the AM. Call if not improved after 7 days or if getting worse.    Orders: Rocephin  250mg  (Z6109) Admin of Therapeutic Inj  intramuscular or subcutaneous ([address])  Complete Medication List: 1)  Duoneb 0.5-2.5 (3) Mg/53ml Soln (Ipratropium-albuterol) .... 3 ml nebs qid 2)  Aspir-low 81 Mg Tbec (Aspirin) .... By mouth daily 3)  Norvasc 5 Mg Tabs (Amlodipine besylate) .Marland Kitchen.. 1  tab by mouth once day 4)  Citalopram Hydrobromide 40 Mg Tabs (Citalopram hydrobromide) .Marland Kitchen.. 1 tab by mouth once daily 5)  Toprol Xl 100 Mg Xr24h-tab (Metoprolol succinate) .Marland Kitchen.. 1 tab by mouth daily 6)  Proair Hfa 108 (90 Base) Mcg/act Aers (Albuterol sulfate) .... 2 puffs q 4-6 hrs prn 7)  Requip 0.5 Mg Tabs (Ropinirole hcl) .Marland Kitchen.. 1 tab by mouth at bedtime 8)  Prednisone 20 Mg Tabs (Prednisone) .... 3 tabs by mouth once daily x 5 days 9)  Zithromax Z-pak 250 Mg Tabs (Azithromycin) .... 2 tabs by mouth x 1 day then 1 tab by mouth daily x 4 days 10)  Hydrocodone-homatropine 5-1.5 Mg Tabs (Hydrocodone-homatropine) .... 5 ml by mouth at bedtime as needed cough  Patient Instructions: 1)  For COPD exacerbation: 2)  Rocephin injection today + 5 days of Zithromax (antibiotics). 3)  Take Prednisone 3 tabs once daily x 5 days (steroids). 4)  Use DuoNebs 4 x a day for the next wk, then move to as needed. 5)  Use RX cough syrup at night and Mucinex DM + Tylenol in the mornings.   6)  Call if not improved after 7 days. Prescriptions: HYDROCODONE-HOMATROPINE 5-1.5 MG TABS (HYDROCODONE-HOMATROPINE) 5 ml by mouth at bedtime as needed cough  #200 ml x 0   Entered and Authorized by:   Seymour Bars DO   Signed by:   Seymour Bars DO on 07/20/2010   Method used:   Printed then faxed to ...       [facility] [address]  [phone]* (retail)       [address] [place], [place]  [address]       Ph: [phone] or [phone]       Fax: [phone]   RxID:   [id number] ZITHROMAX Z-PAK 250 MG TABS (AZITHROMYCIN) 2 tabs by mouth x 1 day then 1 tab by mouth daily x 4 days  #1 pack x 0   Entered and Authorized by:   Seymour Bars DO   Signed by:   Seymour Bars DO on 07/20/2010   Method used:   Electronically to  [facility]  [address] [phone]* (retail)       [address] [place], [place]  [address]       Ph: [phone] or [phone]       Fax: [phone]   RxID:   [id number] PREDNISONE 20 MG TABS (PREDNISONE) 3 tabs by mouth once daily x 5 days  #15 x 0   Entered and Authorized by:   Seymour Bars DO   Signed by:   Seymour Bars DO on 07/20/2010   Method used:   [facility]ron[facility] to        [facility] [phone]* (retail)       [address]       [place], [place]  [address]       Ph: [phone] or [phone]       Fax: [phone]   RxID:   [id number]    Medication Administration  Injection # 1:    Medication: Rocephin  250mg     Diagnosis: CHRONIC OBSTRUCTIVE PULMONARY DISEASE, ACUTE EXACERBATION (ICD-491.21)    Route: IM    Site: LUOQ gluteus    Exp Date: 12/2012    Lot #: [id number]    Comments: 1 gram    Patient tolerated injection without complications    Given by: Payton Spark CMA (July 20, 2010 10:44 AM)  Orders Added: 1)  Est. Patient Level IV [37628] 2)  Rocephin  250mg  [J0696] 3)  Admin of Therapeutic Inj  intramuscular or subcutaneous [96372]     Medication Administration  Injection # 1:    Medication: Rocephin  250mg     Diagnosis: CHRONIC OBSTRUCTIVE PULMONARY DISEASE, ACUTE EXACERBATION (ICD-491.21)    Route: IM    Site: LUOQ gluteus    Exp Date: 12/2012    Lot #: [id number]    Comments: 1 gram    Patient tolerated injection without complications    Given by: Payton Spark CMA (July 20, 2010 10:44 AM)  Orders Added: 1)  Est. Patient  Level IV [16073] 2)  Rocephin  250mg  [J0696] 3)  Admin of Therapeutic Inj  intramuscular or subcutaneous [71062]

## 2010-09-21 NOTE — Progress Notes (Signed)
Summary: overnight O2/ desats  Phone Note Outgoing Call   Summary of Call: Pls let pt know that she qualifies for overnight O2 but had several episodes of desatting which may be sleep apnea.  I'd like to get a sleep study on her at the new [facility].  Pls check w/ pt to see if she wants to go ahead and set this up. Initial call taken by: Seymour Bars DO,  April 14, 2010 12:21 PM  Follow-up for Phone Call        Pt states she had normal sleep study w/ [facility] last yr. Do you want her to have another one? Follow-up by: Payton Spark CMA,  April 14, 2010 1:40 PM  Additional Follow-up for Phone Call Additional follow up Details #1::        I cannot find her report.  Can you call [facility] to see if they have it? Additional Follow-up by: Seymour Bars DO,  April 14, 2010 2:01 PM    Additional Follow-up for Phone Call Additional follow up Details #2::    Requested from [facility]. Follow-up by: Payton Spark CMA,  April 14, 2010 4:18 PM   Appended Document: overnight O2/ desats Pls let pt know that i reviewed her sleep study from 03-2006 and she was negative for sleep apnea but has severe limb jerks with arousals.  It was recommended that she start on Mirapex or REquip.  This may be a good idea for poor sleep and may get her OFF the narcotic cough medicine at night.  Seymour Bars, D.O.  Appended Document: overnight O2/ desats LMOM for Pt to CB.  04/22/2010 @ 9:45am- Pt notified of results and MD instructions. She would like to try either the Mirapex or Requip. [facility] [address] [place]. kJ LPN  Requip 0.5 mg at bedtime tabs sent to the pharmacy.  May need to increase this after 6-8 wks.  Seymour Bars, D.O.

## 2010-09-23 NOTE — Progress Notes (Signed)
Summary: Hycodan  Phone Note Refill Request   Refills Requested: Medication #1:  HYDROCODONE-HOMATROPINE 5-1.5 MG TABS 5 ml by mouth at bedtime as needed cough. Initial call taken by: Payton Spark CMA,  August 26, 2010 11:24 AM  Follow-up for Phone Call        Pls notify pt that her new RX will be for #150 ml / 30 days.  This is for 5 ml/ night.  She is not to request RFs early since this is a narcotic. Follow-up by: Seymour Bars DO,  August 26, 2010 11:28 AM    New/Updated Medications: HYDROCODONE-HOMATROPINE 5-1.5 MG/5ML SYRP (HYDROCODONE-HOMATROPINE) 5 ml by mouth at bedtime as needed cough Prescriptions: HYDROCODONE-HOMATROPINE 5-1.5 MG/5ML SYRP (HYDROCODONE-HOMATROPINE) 5 ml by mouth at bedtime as needed cough  #150 ml x 0   Entered and Authorized by:   Seymour Bars DO   Signed by:   Seymour Bars DO on 08/26/2010   Method used:   Printed then faxed to ...       [facility]  [address] [phone]* (retail)       [address] [place], [place]  [address]       Ph: [phone] or [phone]       Fax: [phone]   RxID:   [id number]   Appended Document: Hycodan Pt aware of the above

## 2010-09-23 NOTE — Progress Notes (Signed)
Summary: return call  Phone Note Call from Patient Call back at Home Phone [phone]   Caller: Patient Call For: Marcelino Duster Summary of Call: Pt calls and LM that she was returning your call. Initial call taken by: Kathlene November LPN,  August 24, 2010 4:19 PM  Follow-up for Phone Call        I did not call Pt Follow-up by: Payton Spark CMA,  August 24, 2010 4:31 PM

## 2010-09-23 NOTE — Letter (Signed)
Summary: [facility]   Imported By: Lanelle Bal 09/10/2010 11:08:57  _____________________________________________________________________  External Attachment:    Type:   Image     Comment:   External Document

## 2010-09-29 NOTE — Letter (Signed)
Summary: [facility]   Imported By: Lanelle Bal 09/23/2010 14:19:08  _____________________________________________________________________  External Attachment:    Type:   Image     Comment:   External Document

## 2010-09-29 NOTE — Procedures (Signed)
Summary: [facility]  [facility]   Imported By: Lanelle Bal 09/23/2010 14:20:02  _____________________________________________________________________  External Attachment:    Type:   Image     Comment:   External Document

## 2010-10-20 ENCOUNTER — Encounter: Payer: Self-pay | Admitting: Family Medicine

## 2010-11-16 ENCOUNTER — Other Ambulatory Visit: Payer: Self-pay | Admitting: Family Medicine

## 2010-12-02 ENCOUNTER — Encounter: Payer: Self-pay | Admitting: Family Medicine

## 2010-12-07 ENCOUNTER — Ambulatory Visit: Payer: Self-pay | Admitting: Family Medicine

## 2011-01-10 ENCOUNTER — Other Ambulatory Visit: Payer: Self-pay | Admitting: Family Medicine

## 2011-01-10 DIAGNOSIS — R05 Cough: Secondary | ICD-10-CM

## 2011-01-10 MED ORDER — HYDROCODONE-HOMATROPINE 5-1.5 MG/5ML PO SYRP: 5.0000 mL | ORAL_SOLUTION | Freq: Every evening | ORAL | Status: DC | PRN

## 2011-01-10 NOTE — Telephone Encounter (Signed)
[facility] Hydrocodone-homatropine syrup. Plan:  [facility]# [facility] with no refills.  Ok per Dr. Cathey Endow.

## 2011-02-19 ENCOUNTER — Ambulatory Visit
Admission: RE | Admit: 2011-02-19 | Discharge: 2011-02-19 | Disposition: A | Discharge status: Home / Self Care | Payer: MEDICARE | Source: Ambulatory Visit | Attending: Emergency Medicine | Admitting: Emergency Medicine

## 2011-02-19 ENCOUNTER — Encounter: Payer: Self-pay | Admitting: Emergency Medicine

## 2011-02-19 ENCOUNTER — Other Ambulatory Visit: Payer: Self-pay | Admitting: Emergency Medicine

## 2011-02-19 ENCOUNTER — Inpatient Hospital Stay (INDEPENDENT_AMBULATORY_CARE_PROVIDER_SITE_OTHER)
Admission: RE | Admit: 2011-02-19 | Discharge: 2011-02-19 | Disposition: A | Discharge status: Home / Self Care | Payer: MEDICARE | Source: Ambulatory Visit | Attending: Emergency Medicine | Admitting: Emergency Medicine

## 2011-02-19 DIAGNOSIS — R05 Cough: Secondary | ICD-10-CM

## 2011-02-19 DIAGNOSIS — J42 Unspecified chronic bronchitis: Secondary | ICD-10-CM

## 2011-02-19 DIAGNOSIS — R079 Chest pain, unspecified: Secondary | ICD-10-CM

## 2011-02-19 DIAGNOSIS — J441 Chronic obstructive pulmonary disease with (acute) exacerbation: Secondary | ICD-10-CM

## 2011-02-21 ENCOUNTER — Telehealth (INDEPENDENT_AMBULATORY_CARE_PROVIDER_SITE_OTHER): Payer: Self-pay | Admitting: Emergency Medicine

## 2011-03-05 ENCOUNTER — Other Ambulatory Visit: Payer: Self-pay | Admitting: Family Medicine

## 2011-03-07 NOTE — Telephone Encounter (Signed)
After this refill patient needs to have a office visit to follow up on her BP.

## 2011-03-09 ENCOUNTER — Encounter: Payer: Self-pay | Admitting: Emergency Medicine

## 2011-03-09 ENCOUNTER — Inpatient Hospital Stay (INDEPENDENT_AMBULATORY_CARE_PROVIDER_SITE_OTHER)
Admission: RE | Admit: 2011-03-09 | Discharge: 2011-03-09 | Disposition: A | Discharge status: Home / Self Care | Payer: MEDICARE | Source: Ambulatory Visit | Attending: Emergency Medicine | Admitting: Emergency Medicine

## 2011-03-09 DIAGNOSIS — R05 Cough: Secondary | ICD-10-CM

## 2011-03-09 DIAGNOSIS — J209 Acute bronchitis, unspecified: Secondary | ICD-10-CM | POA: Insufficient documentation

## 2011-03-10 ENCOUNTER — Telehealth (INDEPENDENT_AMBULATORY_CARE_PROVIDER_SITE_OTHER): Payer: Self-pay | Admitting: *Deleted

## 2011-04-22 ENCOUNTER — Other Ambulatory Visit: Payer: Self-pay | Admitting: Family Medicine

## 2011-04-22 ENCOUNTER — Other Ambulatory Visit: Payer: Self-pay | Admitting: *Deleted

## 2011-04-22 DIAGNOSIS — R05 Cough: Secondary | ICD-10-CM

## 2011-04-22 MED ORDER — HYDROCODONE-HOMATROPINE 5-1.5 MG/5ML PO SYRP: 5.0000 mL | ORAL_SOLUTION | Freq: Every evening | ORAL | Status: DC | PRN

## 2011-05-02 ENCOUNTER — Other Ambulatory Visit: Payer: Self-pay | Admitting: *Deleted

## 2011-05-02 MED ORDER — AMLODIPINE BESYLATE 5 MG PO TABS: 5.0000 mg | ORAL_TABLET | Freq: Every day | ORAL | Status: DC

## 2011-05-25 ENCOUNTER — Other Ambulatory Visit: Payer: Self-pay | Admitting: *Deleted

## 2011-05-25 MED ORDER — CITALOPRAM HYDROBROMIDE 40 MG PO TABS: 40.0000 mg | ORAL_TABLET | Freq: Every day | ORAL | Status: DC

## 2011-05-27 ENCOUNTER — Other Ambulatory Visit: Payer: Self-pay | Admitting: Family Medicine

## 2011-05-27 DIAGNOSIS — R05 Cough: Secondary | ICD-10-CM

## 2011-05-27 MED ORDER — HYDROCODONE-HOMATROPINE 5-1.5 MG/5ML PO SYRP: 5.0000 mL | ORAL_SOLUTION | Freq: Every evening | ORAL | Status: DC | PRN

## 2011-06-02 ENCOUNTER — Other Ambulatory Visit: Payer: Self-pay | Admitting: Family Medicine

## 2011-06-09 ENCOUNTER — Other Ambulatory Visit: Payer: Self-pay | Admitting: Sports Medicine

## 2011-06-09 ENCOUNTER — Ambulatory Visit
Admission: RE | Admit: 2011-06-09 | Discharge: 2011-06-09 | Disposition: A | Discharge status: Home / Self Care | Payer: MEDICARE | Source: Ambulatory Visit | Attending: Sports Medicine | Admitting: Sports Medicine

## 2011-06-09 DIAGNOSIS — M542 Cervicalgia: Secondary | ICD-10-CM

## 2011-06-13 ENCOUNTER — Ambulatory Visit: Payer: Medicare Other | Admitting: Physical Therapy

## 2011-07-08 ENCOUNTER — Other Ambulatory Visit: Payer: Self-pay | Admitting: *Deleted

## 2011-07-08 DIAGNOSIS — R05 Cough: Secondary | ICD-10-CM

## 2011-07-08 MED ORDER — HYDROCODONE-HOMATROPINE 5-1.5 MG/5ML PO SYRP: 5.0000 mL | ORAL_SOLUTION | Freq: Every evening | ORAL | Status: DC | PRN

## 2011-07-21 ENCOUNTER — Encounter: Payer: Self-pay | Admitting: Family Medicine

## 2011-07-25 NOTE — Telephone Encounter (Signed)
  Phone Note Call from Patient Call back at [facility] Phone [phone]   Caller: Patient Call For: Rx Summary of Call: Received message from patient complaining the tessalon is not helping her cough at all. She would like something else for her cough.  Initial call taken by: Lajean Saver RN,  March 10, 2011 8:19 AM    New/Updated Medications: Sandria Senter ER 8-10 MG/5ML LQCR (CHLORPHENIRAMINE-HYDROCODONE) 5 cc by mouth hs as needed cough Prescriptions: TUSSIONEX PENNKINETIC ER 8-10 MG/5ML LQCR (CHLORPHENIRAMINE-HYDROCODONE) 5 cc by mouth hs as needed cough  #4oz x 0   Entered by:   Donna Christen MD   Authorized by:   Lajean Saver RN   Signed by:   Donna Christen MD on 03/10/2011   Method used:   Print then Give to Patient   RxID:   [id number]  Controlled medication; will need to pick up Rx Donna Christen MD  March 10, 2011 9:05 AM   Patient notified that [facility] [address] in [place].  Lajean Saver RN  March 10, 2011 9:58 AM

## 2011-07-25 NOTE — Progress Notes (Signed)
Summary: congestion in chest   Vital Signs:  Patient Profile:   70 Years Old Female CC:      cough, SOB, HA Height:     64.5 inches Weight:      183 pounds BMI:     31.04 BSA:     1.90 O2 Sat:      93 % O2 treatment:    Room Air Temp:     98.5 degrees F oral Pulse rate:   83 / minute Resp:     20 per minute BP sitting:   137 / 84  (left arm) Cuff size:   regular  Vitals Entered By: Lajean Saver RN (March 09, 2011 10:06 AM)                  Updated Prior Medication List: DUONEB 0.5-2.5 (3) MG/3ML SOLN (IPRATROPIUM-ALBUTEROL) 3 ml NEBS QID ASPIR-LOW 81 MG TBEC (ASPIRIN) by mouth daily NORVASC 5 MG TABS (AMLODIPINE BESYLATE) 1  tab by mouth once day CITALOPRAM HYDROBROMIDE 40 MG TABS (CITALOPRAM HYDROBROMIDE) 1 tab by mouth once daily TOPROL XL 100 MG XR24H-TAB (METOPROLOL SUCCINATE) 1 tab by mouth daily PROAIR HFA 108 (90 BASE) MCG/ACT AERS (ALBUTEROL SULFATE) 2 puffs q 4-6 hrs prn REQUIP 0.5 MG TABS (ROPINIROLE HCL) 1 tab by mouth at bedtime PREDNISONE 20 MG TABS (PREDNISONE) 3 tabs by mouth once daily x 5 days ZITHROMAX Z-PAK 250 MG TABS (AZITHROMYCIN) 2 tabs by mouth x 1 day then 1 tab by mouth daily x 4 days HYDROCODONE-HOMATROPINE 5-1.5 MG/5ML SYRP (HYDROCODONE-HOMATROPINE) 5 ml by mouth at bedtime as needed cough  Current Allergies (reviewed today): ! CODEINE * ALTACEHistory of Present Illness History from: patient Chief Complaint: cough, SOB, HA History of Present Illness: 70 Years Old Female complains of onset of cold symptoms for a few days.  Niaja was here about 2 weeks ago, tx for similar symptoms (Zpak, Inhaler, neb, cough meds) and she got better then worse again.  She started smoking again and got sick again.   + sore throat ++cough No pleuritic pain No wheezing + nasal congestion + post-nasal drainage No sinus pain/pressure No chest congestion No itchy/red eyes + R earache No hemoptysis + SOB No chills/sweats No fever + nausea No vomiting No  abdominal pain No diarrhea No skin rashes No fatigue No myalgias No headache   REVIEW OF SYSTEMS Constitutional Symptoms      Denies fever, chills, night sweats, weight loss, weight gain, and fatigue.  Eyes       Complains of glasses.      Denies change in vision, eye pain, eye discharge, contact lenses, and eye surgery. Ear/Nose/Throat/Mouth       Denies hearing loss/aids, change in hearing, ear pain, ear discharge, dizziness, frequent runny nose, frequent nose bleeds, sinus problems, sore throat, hoarseness, and tooth pain or bleeding.  Respiratory       Complains of productive cough, wheezing, and shortness of breath.      Denies dry cough, asthma, bronchitis, and emphysema/COPD.      Comments: SOB at rest Cardiovascular       Denies murmurs, chest pain, and tires easily with exhertion.    Gastrointestinal       Denies stomach pain, nausea/vomiting, diarrhea, constipation, blood in bowel movements, and indigestion. Genitourniary       Denies painful urination, blood or discharge from vagina, kidney stones, and loss of urinary control. Neurological       Complains of headaches.  Denies paralysis, seizures, and fainting/blackouts. Musculoskeletal       Denies muscle pain, joint pain, joint stiffness, decreased range of motion, redness, swelling, muscle weakness, and gout.  Skin       Denies bruising, unusual mles/lumps or sores, and hair/skin or nail changes.  Psych       Denies mood changes, temper/anger issues, anxiety/stress, speech problems, depression, and sleep problems.  Past History:  Past Medical History: Reviewed history from 08/27/2008 and no changes required. [id number] Left hip pain Diverticulosis, colon 1 cm L adrenal adenoma (CT done by Dr Zachery Conch 12-07) stress and urge urinary incontinence-- may go for [facility] mid-urethral sling surgery cath 4-08 normal COPD/ lung nodule -- Dr Steele Berg HTN hyperlipidemia CAD-- Dr Lorenso Courier , [facility] cardiology  Past Surgical  History: Reviewed history from 10/03/2006 and no changes required. Lynx mid urethral sling done by Dr Doristine Locks  Family History: Reviewed history from 07/04/2006 and no changes required. Father died of Oat cell lung CA, ETOH  Mother died with HTN; high cholestrol  Social History: Retired.  Lives with husband Onalee Hua.  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  Having trouble with her son who is abusing perscription meds.  Walks 1 hrs daily.  Sexually active.  Current Smoker Smoking Status:  current Physical Exam General appearance: well developed, well nourished, mild distress, more when coughing Ears: R canal slight erythema compared to L Nasal: mucosa pink, nonedematous, no septal deviation, turbinates normal Oral/Pharynx: clear PND, no exudate, no erythema Neck: neck supple,  trachea midline, no masses Chest/Lungs: scattered rhonchi bilaterally MSE: oriented to time, place, and person Assessment New Problems: COUGH (ICD-786.2) BRONCHITIS, ACUTE (ICD-466.0)   Patient Education: Patient and/or caregiver instructed in the following: quit smoking.  Plan New Medications/Changes: TESSALON 200 MG CAPS (BENZONATATE) 1 by mouth three times a day as needed for cough  #30 x 0, 03/09/2011, Hoyt Koch MD PREDNISONE (PAK) 10 MG TABS (PREDNISONE) 12 day pack, use as directed  #1 x 0, 03/09/2011, Hoyt Koch MD LEVOFLOXACIN 750 MG TABS (LEVOFLOXACIN) 1 by mouth daily for 10 days  #10 x 0, 03/09/2011, Hoyt Koch MD  New Orders: Est. Patient Level IV [45409] Pulse Oximetry (single measurment) [94760] CBC w/Diff [81191-47829] Rocephin 250mg  [CPT-J0696] Admin of Therapeutic Inj  intramuscular or subcutaneous [96372] Planning Comments:   1)  The pt would like to hold off on another CXR for now.  With no fever and will active treatment, I feel ok with waiting a few days.  The last CXR was about 2 weeks ago and showed COPD but nothing acute.  Take the prescribed antibiotic as  instructed.  Also Rocephin IM given today.  She has hydrocodone cough syrup at home as well as albuterol and a nebulizer machine.  Zutripro samples given.  CBC performed (Hgb 14.3, Plt 185, WBC 13.1 -- L 24%, Mo 6%, Gr 70%).   2)  Use nasal saline solution (over the counter) at least 3 times a day. 3)  Use over the counter decongestants like Zyrtec-D every 12 hours as needed to help with congestion. 4)  Can take tylenol every 6 hours or motrin every 8 hours for pain or fever. 5)  Follow up with your primary doctor  if no improvement in 5-7 days, sooner if increasing pain, fever, or new symptoms.  If worsening cough or fever, I instructed the pt to return for repeat CXR.  I would like her to f/u with Dr. Cathey Endow no matter what in 1-2 weeks and talk more about smoking  cessation and the need for further COPD treatment. 6) Stop and don't start smoking!    The patient and/or caregiver has been counseled thoroughly with regard to medications prescribed including dosage, schedule, interactions, rationale for use, and possible side effects and they verbalize understanding.  Diagnoses and expected course of recovery discussed and will return if not improved as expected or if the condition worsens. Patient and/or caregiver verbalized understanding.  Prescriptions: TESSALON 200 MG CAPS (BENZONATATE) 1 by mouth three times a day as needed for cough  #30 x 0   Entered and Authorized by:   Hoyt Koch MD   Signed by:   Hoyt Koch MD on 03/09/2011   Method used:   Print then Give to Patient   RxID:   [id number] PREDNISONE (PAK) 10 MG TABS (PREDNISONE) 12 day pack, use as directed  #1 x 0   Entered and Authorized by:   Hoyt Koch MD   Signed by:   Hoyt Koch MD on 03/09/2011   Method used:   Print then Give to Patient   RxID:   [id number] LEVOFLOXACIN 750 MG TABS (LEVOFLOXACIN) 1 by mouth daily for 10 days  #10 x 0   Entered and Authorized by:   Hoyt Koch MD    Signed by:   Hoyt Koch MD on 03/09/2011   Method used:   Print then Give to Patient   RxID:   [id number]   Medication Administration  Injection # 1:    Medication: Rocephin  250mg     Diagnosis: BRONCHITIS, ACUTE (ICD-466.0)    Route: IM    Site: LUOQ gluteus    Exp Date: 08/22/2013    Lot #: [id number]    Mfr: Sandoz    Comments: 1gm given    Patient tolerated injection without complications    Given by: Lajean Saver RN (March 09, 2011 11:40 AM)  Orders Added: 1)  Est. Patient Level IV [53664] 2)  Pulse Oximetry (single measurment) [94760] 3)  CBC w/Diff [40347-42595] 4)  Rocephin 250mg  [CPT-J0696] 5)  Admin of Therapeutic Inj  intramuscular or subcutaneous [63875]

## 2011-07-25 NOTE — Telephone Encounter (Signed)
  Phone Note Outgoing Call Call back at [facility] Phone [phone]   Call placed by: Lavell Islam RN,  February 21, 2011 2:29 PM Call placed to: Patient Action Taken: Phone Call Completed Summary of Call: Patient states doing better; taking ABX and using inhaler; has not felt need to make appt. with PCP, but knows to do so if not making continued progress. Initial call taken by: Lavell Islam RN,  February 21, 2011 2:30 PM

## 2011-07-25 NOTE — Progress Notes (Signed)
Summary: SOB/HEADACHE/CONGESTION IN IN HEAD AND CHEST   Vital Signs:  Patient Profile:   70 Years Old Female CC:      cough/congestion in chest Height:     64.5 inches Weight:      187 pounds O2 treatment:    Room Air Temp:     98.7 degrees F oral Pulse rate:   71 / minute Resp:     24 per minute BP sitting:   128 / 79  (left arm) Cuff size:   large  Vitals Entered By: Linton Flemings RN (February 19, 2011 11:27 AM)                  Updated Prior Medication List: DUONEB 0.5-2.5 (3) MG/3ML SOLN (IPRATROPIUM-ALBUTEROL) 3 ml NEBS QID ASPIR-LOW 81 MG TBEC (ASPIRIN) by mouth daily NORVASC 5 MG TABS (AMLODIPINE BESYLATE) 1  tab by mouth once day CITALOPRAM HYDROBROMIDE 40 MG TABS (CITALOPRAM HYDROBROMIDE) 1 tab by mouth once daily TOPROL XL 100 MG XR24H-TAB (METOPROLOL SUCCINATE) 1 tab by mouth daily PROAIR HFA 108 (90 BASE) MCG/ACT AERS (ALBUTEROL SULFATE) 2 puffs q 4-6 hrs prn REQUIP 0.5 MG TABS (ROPINIROLE HCL) 1 tab by mouth at bedtime PREDNISONE 20 MG TABS (PREDNISONE) 3 tabs by mouth once daily x 5 days ZITHROMAX Z-PAK 250 MG TABS (AZITHROMYCIN) 2 tabs by mouth x 1 day then 1 tab by mouth daily x 4 days HYDROCODONE-HOMATROPINE 5-1.5 MG/5ML SYRP (HYDROCODONE-HOMATROPINE) 5 ml by mouth at bedtime as needed cough  Current Allergies (reviewed today): ! CODEINE * ALTACEHistory of Present Illness History from: patient Chief Complaint: cough/congestion in chest History of Present Illness: 70 Years Old Female complains of onset of cold symptoms for 7 days.  Nishtha has been using her home nebulizer and cough meds which is helping a little bit.  She has COPD and had PNA last year. No sore throat + cough with clear phlegm No pleuritic pain No wheezing + nasal congestion + post-nasal drainage + sinus pain/pressure + chest congestion No itchy/red eyes No earache No hemoptysis + SOB No chills/sweats + fever yesterday has resolved No nausea No vomiting No abdominal pain No  diarrhea No skin rashes No fatigue No myalgias No headache   REVIEW OF SYSTEMS Constitutional Symptoms      Denies fever, chills, night sweats, weight loss, weight gain, and fatigue.  Eyes       Denies change in vision, eye pain, eye discharge, glasses, contact lenses, and eye surgery. Ear/Nose/Throat/Mouth       Complains of sinus problems and hoarseness.      Denies hearing loss/aids, change in hearing, ear pain, ear discharge, dizziness, frequent runny nose, frequent nose bleeds, sore throat, and tooth pain or bleeding.  Respiratory       Complains of dry cough, wheezing, shortness of breath, and bronchitis.      Denies productive cough, asthma, and emphysema/COPD.  Cardiovascular       Denies murmurs, chest pain, and tires easily with exhertion.    Gastrointestinal       Denies stomach pain, nausea/vomiting, diarrhea, constipation, blood in bowel movements, and indigestion. Genitourniary       Denies painful urination, kidney stones, and loss of urinary control. Neurological       Denies paralysis, seizures, and fainting/blackouts. Musculoskeletal       Denies muscle pain, joint pain, joint stiffness, decreased range of motion, redness, swelling, muscle weakness, and gout.  Skin       Denies bruising, unusual mles/lumps or  sores, and hair/skin or nail changes.  Psych       Denies mood changes, temper/anger issues, anxiety/stress, speech problems, depression, and sleep problems. Other Comments: symptoms started early this week- getting worse. t-max yesterday 100.0   Past History:  Past Medical History: Reviewed history from 08/27/2008 and no changes required. [id number] Left hip pain Diverticulosis, colon 1 cm L adrenal adenoma (CT done by Dr Zachery Conch 12-07) stress and urge urinary incontinence-- may go for [facility] mid-urethral sling surgery cath 4-08 normal COPD/ lung nodule -- Dr Steele Berg HTN hyperlipidemia CAD-- Dr Lorenso Courier , [facility] cardiology  Past Surgical History: Reviewed  history from 10/03/2006 and no changes required. Lynx mid urethral sling done by Dr Doristine Locks  Family History: Reviewed history from 07/04/2006 and no changes required. Father died of Oat cell lung CA, ETOH  Mother died with HTN; high cholestrol  Social History: Reviewed history from 08/27/2008 and no changes required. Retired.  Lives with husband Onalee Hua.  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  Having trouble with her son who is abusing perscription meds.  Walks 1 hrs daily.  Sexually active.  Quit smoking in 12-09. Physical Exam General appearance: well developed, well nourished, mild distress and coughing Ears: normal, no lesions or deformities Nasal: mucosa pink, nonedematous, no septal deviation, turbinates normal Oral/Pharynx: tongue normal, posterior pharynx without erythema or exudate Chest/Lungs: rhonchi RLL Heart: regular rate and  rhythm, no murmur Skin: no obvious rashes or lesions MSE: oriented to time, place, and person Assessment New Problems: COUGH (ICD-786.2)   Patient Education: Patient and/or caregiver instructed in the following: quit smoking.  Plan New Medications/Changes: ZITHROMAX Z-PAK 250 MG TABS (AZITHROMYCIN) use as directed  #1 x 0, 02/19/2011, Hoyt Koch MD PROAIR HFA 108 (90 BASE) MCG/ACT AERS (ALBUTEROL SULFATE) 2 puffs q 4-6 hrs prn  #1 x 1, 02/19/2011, Hoyt Koch MD  New Orders: Est. Patient Level IV [69629] Pulse Oximetry (single measurment) [94760] T-Chest x-ray, 2 views [71020] Rocephin  250mg  [J0696] Admin of Therapeutic Inj  intramuscular or subcutaneous [96372] Planning Comments:   Due to her lung exam, I felt that a CXR was necessary.  It was performed and read by radiology as "Stable mild cardiomegaly and mild changes of COPD and chronic bronchitis. No acute abnormality".  I feel that a few more days she would likely be developing pneumonia.  Will give IM Rocephin 1 gram and then treat with Zpak.  Encourage rest, Tylenol,  OTC cough/cold meds.  She may continue her breathing treatment.  I would like her to f/u with Dr. Cathey Endow in a few days especially if not improving.    The patient and/or caregiver has been counseled thoroughly with regard to medications prescribed including dosage, schedule, interactions, rationale for use, and possible side effects and they verbalize understanding.  Diagnoses and expected course of recovery discussed and will return if not improved as expected or if the condition worsens. Patient and/or caregiver verbalized understanding.  Prescriptions: ZITHROMAX Z-PAK 250 MG TABS (AZITHROMYCIN) use as directed  #1 x 0   Entered and Authorized by:   Hoyt Koch MD   Signed by:   Hoyt Koch MD on 02/19/2011   Method used:   Print then Give to Patient   RxID:   [id number] PROAIR HFA 108 (90 BASE) MCG/ACT AERS (ALBUTEROL SULFATE) 2 puffs q 4-6 hrs prn  #1 x 1   Entered and Authorized by:   Hoyt Koch MD   Signed by:   Hoyt Koch MD on 02/19/2011   Method  used:   Print then Give to Patient   RxID:   [id number]   Medication Administration  Injection # 1:    Medication: Rocephin  250mg     Diagnosis: CHRONIC OBSTRUCTIVE PULMONARY DISEASE, ACUTE EXACERBATION (ICD-491.21)    Route: IM    Site: LUOQ gluteus    Exp Date: 06/22/2013    Lot #: [id number]    Mfr: SANDOZ    Comments: rocephin 1gm given IM    Patient tolerated injection without complications    Given by: Linton Flemings RN (February 19, 2011 12:32 PM)  Orders Added: 1)  Est. Patient Level IV [28413] 2)  Pulse Oximetry (single measurment) [94760] 3)  T-Chest x-ray, 2 views [71020] 4)  Rocephin  250mg  [J0696] 5)  Admin of Therapeutic Inj  intramuscular or subcutaneous [24401]

## 2011-08-01 ENCOUNTER — Other Ambulatory Visit: Payer: Self-pay | Admitting: Family Medicine

## 2011-08-09 ENCOUNTER — Other Ambulatory Visit: Payer: Self-pay | Admitting: Family Medicine

## 2011-08-09 DIAGNOSIS — R05 Cough: Secondary | ICD-10-CM

## 2011-08-09 MED ORDER — HYDROCODONE-HOMATROPINE 5-1.5 MG/5ML PO SYRP: 5.0000 mL | ORAL_SOLUTION | Freq: Every evening | ORAL | Status: DC | PRN

## 2011-08-09 NOTE — Telephone Encounter (Signed)
Pt calls and request a refill on her cough med she takes at night time for her chronic cough. [facility]

## 2011-08-09 NOTE — Telephone Encounter (Signed)
Patient called request to speak with someone about her prescription she is a patient of Dr. Misty Stanley.

## 2011-08-09 NOTE — Telephone Encounter (Signed)
Ok to refill. I notice she hs been using more of it lately.

## 2011-08-13 ENCOUNTER — Emergency Department (INDEPENDENT_AMBULATORY_CARE_PROVIDER_SITE_OTHER)
Admission: EM | Admit: 2011-08-13 | Discharge: 2011-08-13 | Disposition: A | Discharge status: Home / Self Care | Payer: Medicare Other | Source: Home / Self Care | Attending: Emergency Medicine | Admitting: Emergency Medicine

## 2011-08-13 ENCOUNTER — Encounter: Payer: Self-pay | Admitting: Emergency Medicine

## 2011-08-13 DIAGNOSIS — J209 Acute bronchitis, unspecified: Secondary | ICD-10-CM

## 2011-08-13 DIAGNOSIS — R05 Cough: Secondary | ICD-10-CM

## 2011-08-13 MED ORDER — CEFTRIAXONE SODIUM 1 G IJ SOLR
1.0000 g | Freq: Once | INTRAMUSCULAR | Status: AC
  Administered 2011-08-13: 1 g via INTRAMUSCULAR

## 2011-08-13 MED ORDER — LEVOFLOXACIN 500 MG PO TABS: 500.0000 mg | ORAL_TABLET | Freq: Every day | ORAL | Status: AC

## 2011-08-13 MED ORDER — PREDNISONE (PAK) 10 MG PO TABS: 10.0000 mg | ORAL_TABLET | Freq: Every day | ORAL | Status: AC

## 2011-08-13 NOTE — ED Provider Notes (Signed)
History     CSN: [id number]  Arrival date & time 08/13/11  1132   First MD Initiated Contact with Patient 08/13/11 1148      No chief complaint on file.   (Consider location/radiation/quality/duration/timing/severity/associated sxs/prior treatment) HPI Nancy Cisneros is a 70 y.o. female who complains of onset of cold symptoms for 3 days. She has a history of COPD. No sore throat + cough No pleuritic pain No wheezing + nasal congestion + post-nasal drainage No sinus pain/pressure + chest congestion No itchy/red eyes No earache No hemoptysis No SOB No chills/sweats No fever No nausea No vomiting No abdominal pain No diarrhea No skin rashes No fatigue No myalgias No headache    Past Medical History  Diagnosis Date  . Hip pain, left   . Diverticulosis of colon   . Adrenal adenoma     1 cm Left  . COPD (chronic obstructive pulmonary disease)     lung nodule- Dr Steele Berg  . Hypertension   . Hyperlipidemia   . CAD (coronary artery disease)     Dr Lorenso Courier, [facility] cardiology    Past Surgical History  Procedure Date  . Lynx mid urethral sling     Dr Doristine Locks    Family History  Problem Relation Age of Onset  . Hyperlipidemia Mother   . Hypertension Mother   . Cancer Father     Oat cell lung CA  . Alcohol abuse Father     History  Substance Use Topics  . Smoking status: Former Smoker    Quit date: 07/22/2008  . Smokeless tobacco: Not on file  . Alcohol Use:     OB History    Grav Para Term Preterm Abortions TAB SAB Ect Mult Living                  Review of Systems  Allergies  Codeine and Ramipril  Home Medications   Current Outpatient Rx  Name Route Sig Dispense Refill  . ALBUTEROL SULFATE HFA 108 (90 BASE) MCG/ACT IN AERS Inhalation Inhale 2 puffs into the lungs every 6 (six) hours as needed.      Marland Kitchen AMLODIPINE BESYLATE 5 MG PO TABS  TAKE 1 TABLET (5 MG TOTAL) BY MOUTH DAILY. 30 tablet 0    Needs office visit  . ASPIRIN 81 MG PO TABS Oral Take  81 mg by mouth daily.      . AZITHROMYCIN 250 MG PO TABS Oral Take 2 tablets by mouth on day 1, followed by 1 tablet by mouth daily for 4 days. 2 tabs po X 1 day then 1 tab po daily X 4 days     . CITALOPRAM HYDROBROMIDE 40 MG PO TABS Oral Take 1 tablet (40 mg total) by mouth daily. 90 tablet 1  . HYDROCODONE-HOMATROPINE 5-1.5 MG/5ML PO SYRP Oral Take 5 mLs by mouth at bedtime as needed. For cough 150 mL 0  . IPRATROPIUM-ALBUTEROL 0.5-2.5 (3) MG/3ML IN SOLN Nebulization Take 3 mLs by nebulization 4 (four) times daily.      Marland Kitchen METOPROLOL SUCCINATE ER 100 MG PO TB24  TAKE 1 TABLET BY MOUTH EVERY DAY 90 tablet 0  . PREDNISONE 20 MG PO TABS Oral Take 20 mg by mouth daily. 3 tabs po daily X 5 days     . ROPINIROLE HCL 0.5 MG PO TABS Oral Take 0.5 mg by mouth at bedtime.        There were no vitals taken for this visit.  Physical Exam  Nursing  note and vitals reviewed. Constitutional: She is oriented to person, place, and time. She appears well-developed and well-nourished.  HENT:  Head: Normocephalic and atraumatic.  Right Ear: Tympanic membrane, external ear and ear canal normal.  Left Ear: Tympanic membrane, external ear and ear canal normal.  Nose: Mucosal edema and rhinorrhea present.  Mouth/Throat: Posterior oropharyngeal erythema present. No oropharyngeal exudate or posterior oropharyngeal edema.  Eyes: No scleral icterus.  Neck: Neck supple.  Cardiovascular: Regular rhythm and normal heart sounds.   Pulmonary/Chest: Effort normal. No respiratory distress. She has no decreased breath sounds. She has wheezes (scattered). She has rhonchi (Scattered).  Neurological: She is alert and oriented to person, place, and time.  Skin: Skin is warm and dry.  Psychiatric: She has a normal mood and affect. Her speech is normal.    ED Course  Procedures (including critical care time)  Labs Reviewed - No data to display No results found.   No diagnosis found.    MDM  1)  Take the prescribed  antibiotic as instructed.  She already has cough medicine at home that she will take. Last time that she was here she also needed a prescription for prednisone after a few days some to go ahead and give her the prescription and tell her to hold it for a few days. As of right now I do not believe that she artery has pneumonia but we are treating her as if she does. If she is progressively getting a lot worse, I would like her to return to clinic for a chest x-ray. 2)  Use nasal saline solution (over the counter) at least 3 times a day. 3)  Use over the counter decongestants like Zyrtec-D every 12 hours as needed to help with congestion.  If you have hypertension, do not take medicines with sudafed.  4)  Can take tylenol every 6 hours or motrin every 8 hours for pain or fever. 5)  Follow up with your primary doctor if no improvement in 5-7 days, sooner if increasing pain, fever, or new symptoms.     Lily Kocher, MD 08/13/11 [phone]

## 2011-08-13 NOTE — ED Notes (Signed)
Congestion and cough x 3 days. Did have Flu vaccination this season.

## 2011-08-18 ENCOUNTER — Emergency Department (INDEPENDENT_AMBULATORY_CARE_PROVIDER_SITE_OTHER)
Admission: EM | Admit: 2011-08-18 | Discharge: 2011-08-18 | Disposition: A | Discharge status: Home / Self Care | Payer: Medicare Other | Source: Home / Self Care | Attending: Family Medicine | Admitting: Family Medicine

## 2011-08-18 ENCOUNTER — Encounter: Payer: Self-pay | Admitting: Family Medicine

## 2011-08-18 ENCOUNTER — Encounter: Payer: Self-pay | Admitting: *Deleted

## 2011-08-18 ENCOUNTER — Emergency Department
Admit: 2011-08-18 | Discharge: 2011-08-18 | Disposition: A | Discharge status: Home / Self Care | Payer: Medicare Other | Attending: Family Medicine | Admitting: Family Medicine

## 2011-08-18 DIAGNOSIS — J441 Chronic obstructive pulmonary disease with (acute) exacerbation: Secondary | ICD-10-CM

## 2011-08-18 DIAGNOSIS — J209 Acute bronchitis, unspecified: Secondary | ICD-10-CM

## 2011-08-18 LAB — POCT CBC W AUTO DIFF (K'VILLE URGENT CARE)

## 2011-08-18 MED ORDER — CEFTRIAXONE SODIUM 1 G IJ SOLR
1.0000 g | Freq: Once | INTRAMUSCULAR | Status: AC
  Administered 2011-08-18: 1 g via INTRAMUSCULAR

## 2011-08-18 MED ORDER — CEFDINIR 300 MG PO CAPS: 300.0000 mg | ORAL_CAPSULE | Freq: Two times a day (BID) | ORAL | Status: AC

## 2011-08-18 MED ORDER — HYDROCODONE-ACETAMINOPHEN 5-500 MG PO TABS: 1.0000 | ORAL_TABLET | Freq: Every evening | ORAL | Status: AC | PRN

## 2011-08-18 NOTE — ED Provider Notes (Addendum)
History     CSN: [id number]  Arrival date & time 08/18/11  1029   First MD Initiated Contact with Patient 08/18/11 1303      Chief Complaint  Patient presents with  . Cough      HPI Comments: Patient was seen 5 days ago for respiratory infection.  She is finishing up a 7 day course of Levaquin and starting a second 6 day course of prednisone. She complains of persistent non productive cough daytime, worse at night, with shortness of breath and wheezing.  Two days ago she developed pleuritic pain in her right posterior chest.  She has been using her nebulizer (albuterol) at home and albuterol inhaler.  She had pneumonia two years ago.  Patient is a 70 y.o. female presenting with cough. The history is provided by the patient.  Cough This is a recurrent problem. The current episode started more than 1 week ago. The problem occurs constantly. The problem has been gradually worsening. The cough is non-productive. Associated symptoms include chest pain, chills, sweats, headaches, rhinorrhea, sore throat, shortness of breath and wheezing. Pertinent negatives include no ear congestion, no ear pain and no myalgias. She has tried cough syrup for the symptoms. The treatment provided no relief. She is not a smoker (Former smoker).    Past Medical History  Diagnosis Date  . Hip pain, left   . Diverticulosis of colon   . Adrenal adenoma     1 cm Left  . COPD (chronic obstructive pulmonary disease)     lung nodule- Dr Steele Berg  . Hypertension   . Hyperlipidemia   . CAD (coronary artery disease)     Dr Lorenso Courier, [facility] cardiology    Past Surgical History  Procedure Date  . Lynx mid urethral sling     Dr Doristine Locks    Family History  Problem Relation Age of Onset  . Hyperlipidemia Mother   . Hypertension Mother   . Cancer Father     Oat cell lung CA  . Alcohol abuse Father     History  Substance Use Topics  . Smoking status: Former Smoker    Quit date: 07/22/2008  . Smokeless  tobacco: Not on file  . Alcohol Use: No    OB History    Grav Para Term Preterm Abortions TAB SAB Ect Mult Living                  Review of Systems  Constitutional: Positive for chills, activity change and fatigue. Negative for fever.  HENT: Positive for congestion, sore throat, rhinorrhea and postnasal drip. Negative for ear pain.   Eyes: Negative.   Respiratory: Positive for cough, shortness of breath and wheezing.   Cardiovascular: Positive for chest pain. Negative for palpitations and leg swelling.  Gastrointestinal: Negative.   Genitourinary: Negative.   Musculoskeletal: Negative for myalgias.  Skin: Negative.   Neurological: Positive for headaches.    Allergies  Codeine and Ramipril  Home Medications   Current Outpatient Rx  Name Route Sig Dispense Refill  . ALBUTEROL SULFATE HFA 108 (90 BASE) MCG/ACT IN AERS Inhalation Inhale 2 puffs into the lungs every 6 (six) hours as needed.      Marland Kitchen AMLODIPINE BESYLATE 5 MG PO TABS  TAKE 1 TABLET (5 MG TOTAL) BY MOUTH DAILY. 30 tablet 0    Needs office visit  . ASPIRIN 81 MG PO TABS Oral Take 81 mg by mouth daily.      . AZITHROMYCIN 250 MG PO  TABS Oral Take 2 tablets by mouth on day 1, followed by 1 tablet by mouth daily for 4 days. 2 tabs po X 1 day then 1 tab po daily X 4 days     . CEFDINIR 300 MG PO CAPS Oral Take 1 capsule (300 mg total) by mouth 2 (two) times daily. 20 capsule 0  . CITALOPRAM HYDROBROMIDE 40 MG PO TABS Oral Take 1 tablet (40 mg total) by mouth daily. 90 tablet 1  . HYDROCODONE-ACETAMINOPHEN 5-500 MG PO TABS Oral Take 1 tablet by mouth at bedtime as needed for pain. 10 tablet 0  . HYDROCODONE-HOMATROPINE 5-1.5 MG/5ML PO SYRP Oral Take 5 mLs by mouth at bedtime as needed. For cough 150 mL 0  . IPRATROPIUM-ALBUTEROL 0.5-2.5 (3) MG/3ML IN SOLN Nebulization Take 3 mLs by nebulization 4 (four) times daily.      Marland Kitchen LEVOFLOXACIN 500 MG PO TABS Oral Take 1 tablet (500 mg total) by mouth daily. 7 tablet 0  . METOPROLOL  SUCCINATE ER 100 MG PO TB24  TAKE 1 TABLET BY MOUTH EVERY DAY 90 tablet 0  . PREDNISONE 20 MG PO TABS Oral Take 20 mg by mouth daily. 3 tabs po daily X 5 days     . PREDNISONE (PAK) 10 MG PO TABS Oral Take 1 tablet (10 mg total) by mouth daily. 6 day pack, use as directed 1 tablet 0  . ROPINIROLE HCL 0.5 MG PO TABS Oral Take 0.5 mg by mouth at bedtime.        BP 161/90  Pulse 75  Temp(Src) 98.3 F (36.8 C) (Oral)  Resp 18  Ht 5\' 5"  (1.651 m)  Wt 189 lb 4 oz (85.843 kg)  BMI 31.49 kg/m2  SpO2 94%  Physical Exam Nursing notes and Vital Signs reviewed. Appearance:  Patient appears stated age, and in no acute distress.  She is alert and oriented  Eyes:  Pupils are equal, round, and reactive to light and accomodation.  Extraocular movement is intact.  Conjunctivae are not inflamed  Ears:  Canals normal.  Tympanic membranes normal.  Nose:  Mildly congested turbinates.  No sinus tenderness.   Pharynx:  Normal Neck:  Supple.   No adenopathy Lungs:  Diffuse wheezes and rhonchi bilaterally.  No rales.  Breath sounds are equal.  Heart:  Regular rate and rhythm without murmurs, rubs, or gallops.  Abdomen:  Nontender without masses or hepatosplenomegaly.  Bowel sounds are present.  No CVA or flank tenderness.  Extremities:  No edema.  No calf tenderness Skin:  No rash present.   ED Course  Procedures  none   Labs Reviewed  POCT CBC W AUTO DIFF ([facility]):  CBC:  WBC 14.3; LY 17.3; MO 2.9; GR 79.8; Hgb 14/6    Dg Chest 2 View  08/18/2011  *RADIOLOGY REPORT*  Clinical Data: 1-week history of cough.  Right-sided chest pain. Shortness of breath and wheezing.  Former smoker.  CHEST - 2 VIEW 08/18/2011:  Comparison: Two-view chest x-ray 02/19/2011 and 04/06/2010 [facility].  CT chest 08/28/2009, 08/27/2008, 03/24/2008, and 01/31/2008 [facility].  Findings: Cardiac silhouette normal in size, unchanged.  Thoracic aorta mildly atherosclerotic, unchanged.  Hilar and  mediastinal contours otherwise unremarkable.  Mild central peribronchial thickening, increased since the prior examinations.  Linear scarring involving the lingula, unchanged.  Lungs otherwise clear. No pleural effusions.  Degenerative changes involving the thoracic spine.  IMPRESSION: Mild changes of acute bronchitis and/or asthma.  Original Report Authenticated By: Arnell Sieving, M.D.  1. Acute bronchitis   2. COPD with exacerbation       MDM  Rocephin 1gm IM.  Begin Omnicef.  Continue prednisone pack. Take Mucinex (guaifenesin) twice daily for congestion.  Increase fluid intake, rest. May use Afrin nasal spray (or generic oxymetazoline) twice daily for about 5 days.  Also recommend using saline nasal spray several times daily and/or saline nasal irrigation. Stop all antihistamines for now, and other non-prescription cough/cold preparations. May use a lightly applied rib belt as needed for rib pain. Continue inhalers.   Stop Levaquin. Followup with Dr. Moss Mc in about one week (earlier if symptoms worsen)       Donna Christen, MD 08/18/11 1438  Donna Christen, MD 08/22/11 [phone]

## 2011-08-18 NOTE — ED Notes (Signed)
Pt states that she was seen Saturday and she is no better. Denies fever.

## 2011-08-22 ENCOUNTER — Telehealth: Payer: Self-pay | Admitting: Emergency Medicine

## 2011-08-26 ENCOUNTER — Other Ambulatory Visit: Payer: Self-pay | Admitting: Family Medicine

## 2011-09-02 ENCOUNTER — Other Ambulatory Visit: Payer: Self-pay | Admitting: Family Medicine

## 2011-09-08 ENCOUNTER — Other Ambulatory Visit: Payer: Self-pay | Admitting: *Deleted

## 2011-09-08 DIAGNOSIS — R05 Cough: Secondary | ICD-10-CM

## 2011-09-08 MED ORDER — HYDROCODONE-HOMATROPINE 5-1.5 MG/5ML PO SYRP: 5.0000 mL | ORAL_SOLUTION | Freq: Every evening | ORAL | Status: DC | PRN

## 2011-10-03 ENCOUNTER — Telehealth: Payer: Self-pay | Admitting: *Deleted

## 2011-10-03 NOTE — Telephone Encounter (Signed)
Pt called and requested a refill on Hycodan and I refused it due to it being filled on the 17th. Pt states on return call that she will call back in a week for a refill on it. Are you ok with this being filled on a monthly basis?

## 2011-10-03 NOTE — Telephone Encounter (Signed)
Not if she is still smoking.

## 2011-10-20 ENCOUNTER — Other Ambulatory Visit: Payer: Self-pay | Admitting: Sports Medicine

## 2011-10-20 ENCOUNTER — Ambulatory Visit
Admission: RE | Admit: 2011-10-20 | Discharge: 2011-10-20 | Disposition: A | Discharge status: Home / Self Care | Payer: Medicare Other | Source: Ambulatory Visit | Attending: Sports Medicine | Admitting: Sports Medicine

## 2011-10-20 DIAGNOSIS — R609 Edema, unspecified: Secondary | ICD-10-CM

## 2011-10-25 ENCOUNTER — Other Ambulatory Visit: Payer: Self-pay | Admitting: *Deleted

## 2011-10-25 NOTE — Telephone Encounter (Signed)
Ok to fill but she really needs to work on smoking cessation. [facility] has sevearl pograms that are free and available, also consider 1800-QUIT-NOW which is over the telephone and is free.

## 2011-10-25 NOTE — Telephone Encounter (Signed)
Pt is requesting refill on Hycodan cough syrup. Per previous note, states that we won't refill if she is still smoking. Pt states that she is trying to quit. Please advise if I can refill.

## 2011-10-26 ENCOUNTER — Other Ambulatory Visit: Payer: Self-pay | Admitting: *Deleted

## 2011-10-26 DIAGNOSIS — R05 Cough: Secondary | ICD-10-CM

## 2011-10-26 MED ORDER — HYDROCODONE-HOMATROPINE 5-1.5 MG/5ML PO SYRP: 5.0000 mL | ORAL_SOLUTION | Freq: Every evening | ORAL | Status: DC | PRN

## 2011-10-26 NOTE — Telephone Encounter (Signed)
Pt informed

## 2011-11-26 ENCOUNTER — Other Ambulatory Visit: Payer: Self-pay | Admitting: Family Medicine

## 2011-12-09 ENCOUNTER — Other Ambulatory Visit: Payer: Self-pay | Admitting: *Deleted

## 2011-12-09 ENCOUNTER — Other Ambulatory Visit: Payer: Self-pay | Admitting: Family Medicine

## 2011-12-09 MED ORDER — CITALOPRAM HYDROBROMIDE 40 MG PO TABS: 40.0000 mg | ORAL_TABLET | Freq: Every day | ORAL | Status: DC

## 2011-12-09 NOTE — Telephone Encounter (Signed)
Denied. Needs OV. Hasn't been seen in a year.

## 2011-12-09 NOTE — Telephone Encounter (Signed)
Pt called and wants a refill of the hycodan.please advise

## 2011-12-09 NOTE — Telephone Encounter (Signed)
Pt.notified

## 2012-01-03 ENCOUNTER — Ambulatory Visit: Payer: Medicare Other | Admitting: Family Medicine

## 2012-01-03 ENCOUNTER — Encounter: Payer: Self-pay | Admitting: Family Medicine

## 2012-02-25 ENCOUNTER — Other Ambulatory Visit: Payer: Self-pay | Admitting: Family Medicine

## 2012-05-02 ENCOUNTER — Other Ambulatory Visit: Payer: Self-pay | Admitting: Family Medicine

## 2012-05-15 ENCOUNTER — Ambulatory Visit (INDEPENDENT_AMBULATORY_CARE_PROVIDER_SITE_OTHER): Payer: Medicare Other | Admitting: Family Medicine

## 2012-05-15 ENCOUNTER — Encounter: Payer: Self-pay | Admitting: Family Medicine

## 2012-05-15 ENCOUNTER — Ambulatory Visit (INDEPENDENT_AMBULATORY_CARE_PROVIDER_SITE_OTHER): Payer: Medicare Other

## 2012-05-15 VITALS — BP 150/82 | HR 72 | Wt 194.0 lb

## 2012-05-15 DIAGNOSIS — J449 Chronic obstructive pulmonary disease, unspecified: Secondary | ICD-10-CM

## 2012-05-15 DIAGNOSIS — J441 Chronic obstructive pulmonary disease with (acute) exacerbation: Secondary | ICD-10-CM

## 2012-05-15 DIAGNOSIS — Z720 Tobacco use: Secondary | ICD-10-CM

## 2012-05-15 DIAGNOSIS — J40 Bronchitis, not specified as acute or chronic: Secondary | ICD-10-CM

## 2012-05-15 DIAGNOSIS — I10 Essential (primary) hypertension: Secondary | ICD-10-CM

## 2012-05-15 DIAGNOSIS — F17200 Nicotine dependence, unspecified, uncomplicated: Secondary | ICD-10-CM

## 2012-05-15 DIAGNOSIS — G629 Polyneuropathy, unspecified: Secondary | ICD-10-CM | POA: Insufficient documentation

## 2012-05-15 MED ORDER — AMBULATORY NON FORMULARY MEDICATION: Status: DC

## 2012-05-15 MED ORDER — ALBUTEROL SULFATE (5 MG/ML) 0.5% IN NEBU
2.5000 mg | INHALATION_SOLUTION | Freq: Once | RESPIRATORY_TRACT | Status: AC
  Administered 2012-05-15: 2.5 mg via RESPIRATORY_TRACT

## 2012-05-15 MED ORDER — METOPROLOL SUCCINATE ER 100 MG PO TB24: 100.0000 mg | ORAL_TABLET | Freq: Every day | ORAL | Status: DC

## 2012-05-15 MED ORDER — AMLODIPINE BESYLATE 5 MG PO TABS: 5.0000 mg | ORAL_TABLET | Freq: Every day | ORAL | Status: DC

## 2012-05-15 MED ORDER — HYDROCODONE-HOMATROPINE 5-1.5 MG/5ML PO SYRP: 5.0000 mL | ORAL_SOLUTION | Freq: Every evening | ORAL | Status: DC | PRN

## 2012-05-15 MED ORDER — DOXYCYCLINE HYCLATE 100 MG PO TABS: 100.0000 mg | ORAL_TABLET | Freq: Two times a day (BID) | ORAL | Status: AC

## 2012-05-15 MED ORDER — BUDESONIDE-FORMOTEROL FUMARATE 160-4.5 MCG/ACT IN AERO: 2.0000 | INHALATION_SPRAY | Freq: Two times a day (BID) | RESPIRATORY_TRACT | Status: DC

## 2012-05-15 NOTE — Progress Notes (Signed)
  Subjective:    Patient ID: Nancy Cisneros, female    DOB: 07-Jan-1941, 71 y.o.   MRN: [id number]  HPI  Former patient of Dr. Seymour Bars. She has not been here in over a year.  HTN- No CP or SOB. Out of BP meds for years.  She is only taking amlodipine.   Chest congestion afor one week.  Some SOB. Still smoking. Has been using albuterol at night before going to bed.  Cough is keeping her up at night.  Cough is more dry.  No fever. + HA. No sinus congestion. Says usually happens this time of year.  Someimte has to have steroids.   Review of Systems     Objective:   Physical Exam  Constitutional: She is oriented to person, place, and time. She appears well-developed and well-nourished.  HENT:  Head: Normocephalic and atraumatic.  Right Ear: External ear normal.  Left Ear: External ear normal.  Nose: Nose normal.  Mouth/Throat: Oropharynx is clear and moist.       TMs and canals are clear.   Eyes: Conjunctivae normal and EOM are normal. Pupils are equal, round, and reactive to light.  Neck: Neck supple. No thyromegaly present.  Cardiovascular: Normal rate, regular rhythm and normal heart sounds.   Pulmonary/Chest: Effort normal. She has wheezes.       Expiratory wheezing and rhonchi.   Lymphadenopathy:    She has no cervical adenopathy.  Neurological: She is alert and oriented to person, place, and time.  Skin: Skin is warm and dry.  Psychiatric: She has a normal mood and affect.          Assessment & Plan:  HTN- Not well controlled. Will restart her metoprolol in addition to her amlodpine. F/U in one months for BP check.  Due for CMP and fasting lipid panel.  Acute bronchitis/COPD- COPD exacerbation with bronchitis.  Recommend smoking cessation. Given albuterol neb in the office.  Peak flow in 250.  Given sample of symbicort.  F/U in 2-3 weeks. Start doxycycline.  call if getting worse.  Will get xray and we can call with results. Also encourage smoking cessation handout  given.

## 2012-05-15 NOTE — Patient Instructions (Addendum)
Acute Bronchitis You have acute bronchitis. This means you have a chest cold. The airways in your lungs are red and sore (inflamed). Acute means it is sudden onset.   CAUSES Bronchitis is most often caused by the same virus that causes a cold. SYMPTOMS    Body aches.   Chest congestion.   Chills.   Cough.   Fever.   Shortness of breath.   Sore throat.  TREATMENT   Acute bronchitis is usually treated with rest, fluids, and medicines for relief of fever or cough. Most symptoms should go away after a few days or a week. Increased fluids may help thin your secretions and will prevent dehydration. Your caregiver may give you an inhaler to improve your symptoms. The inhaler reduces shortness of breath and helps control cough. You can take over-the-counter pain relievers or cough medicine to decrease coughing, pain, or fever. A cool-air vaporizer may help thin bronchial secretions and make it easier to clear your chest. Antibiotics are usually not needed but can be prescribed if you smoke, are seriously ill, have chronic lung problems, are elderly, or you are at higher risk for developing complications. Allergies and asthma can make bronchitis worse. Repeated episodes of bronchitis may cause longstanding lung problems. Avoid smoking and secondhand smoke. Exposure to cigarette smoke or irritating chemicals will make bronchitis worse. If you are a cigarette smoker, consider using nicotine gum or skin patches to help control withdrawal symptoms. Quitting smoking will help your lungs heal faster. Recovery from bronchitis is often slow, but you should start feeling better after 2 to 3 days. Cough from bronchitis frequently lasts for 3 to 4 weeks. To prevent another bout of acute bronchitis:  Quit smoking.   Wash your hands frequently to get rid of viruses or use a hand sanitizer.   Avoid other people with cold or virus symptoms.   Try not to touch your hands to your mouth, nose, or eyes.  SEEK  IMMEDIATE MEDICAL CARE IF:  You develop increased fever, chills, or chest pain.   You have severe shortness of breath or bloody sputum.   You develop dehydration, fainting, repeated vomiting, or a severe headache.   You have no improvement after 1 week of treatment or you get worse.  MAKE SURE YOU:    Understand these instructions.   Will watch your condition.   Will get help right away if you are not doing well or get worse.  Document Released: 09/15/2004 Document Revised: 07/28/2011 Document Reviewed: 12/01/2010 [facility] Patient Information 2012 [place], [place].Smoking Cessation This document explains the best ways for you to quit smoking and new treatments to help. It lists new medicines that can double or triple your chances of quitting and quitting for good. It also considers ways to avoid relapses and concerns you may have about quitting, including weight gain. NICOTINE: A POWERFUL ADDICTION If you have tried to quit smoking, you know how hard it can be. It is hard because nicotine is a very addictive drug. For some people, it can be as addictive as heroin or cocaine. Usually, people make 2 or 3 tries, or more, before finally being able to quit. Each time you try to quit, you can learn about what helps and what hurts. Quitting takes hard work and a lot of effort, but you can quit smoking. QUITTING SMOKING IS ONE OF THE MOST IMPORTANT THINGS YOU WILL EVER DO.  You will live longer, feel better, and live better.   The impact on your body  of quitting smoking is felt almost immediately:   Within 20 minutes, blood pressure decreases. Pulse returns to its normal level.   After 8 hours, carbon monoxide levels in the blood return to normal. Oxygen level increases.   After 24 hours, chance of heart attack starts to decrease. Breath, hair, and body stop smelling like smoke.   After 48 hours, damaged nerve endings begin to recover. Sense of taste and smell improve.   After 72 hours, the  body is virtually free of nicotine. Bronchial tubes relax and breathing becomes easier.   After 2 to 12 weeks, lungs can hold more air. Exercise becomes easier and circulation improves.   Quitting will reduce your risk of having a heart attack, stroke, cancer, or lung disease:   After 1 year, the risk of coronary heart disease is cut in half.   After 5 years, the risk of stroke falls to the same as a nonsmoker.   After 10 years, the risk of lung cancer is cut in half and the risk of other cancers decreases significantly.   After 15 years, the risk of coronary heart disease drops, usually to the level of a nonsmoker.   If you are pregnant, quitting smoking will improve your chances of having a healthy baby.   The people you live with, especially your children, will be healthier.   You will have extra money to spend on things other than cigarettes.  FIVE KEYS TO QUITTING Studies have shown that these 5 steps will help you quit smoking and quit for good. You have the best chances of quitting if you use them together: 1. Get ready.  2. Get support and encouragement.  3. Learn new skills and behaviors.  4. Get medicine to reduce your nicotine addiction and use it correctly.  5. Be prepared for relapse or difficult situations. Be determined to continue trying to quit, even if you do not succeed at first.  1. GET READY  Set a quit date.   Change your environment.   Get rid of ALL cigarettes, ashtrays, matches, and lighters in your home, car, and place of work.   Do not let people smoke in your home.   Review your past attempts to quit. Think about what worked and what did not.   Once you quit, do not smoke. NOT EVEN A PUFF!  2. GET SUPPORT AND ENCOURAGEMENT Studies have shown that you have a better chance of being successful if you have help. You can get support in many ways.  Tell your family, friends, and coworkers that you are going to quit and need their support. Ask them not  to smoke around you.   Talk to your caregivers (doctor, dentist, nurse, pharmacist, psychologist, and/or smoking counselor).   Get individual, group, or telephone counseling and support. The more counseling you have, the better your chances are of quitting. Programs are available at [employer] and health centers. Call your local health department for information about programs in your area.   Spiritual beliefs and practices may help some smokers quit.   Quit meters are Photographer that keep track of quit statistics, such as amount of "quit-time," cigarettes not smoked, and money saved.   Many smokers find one or more of the many self-help books available useful in helping them quit and stay off tobacco.  3. LEARN NEW SKILLS AND BEHAVIORS  Try to distract yourself from urges to smoke. Talk to someone, go for a walk, or  occupy your time with a task.   When you first try to quit, change your routine. Take a different route to work. Drink tea instead of coffee. Eat breakfast in a different place.   Do something to reduce your stress. Take a hot bath, exercise, or read a book.   Plan something enjoyable to do every day. Reward yourself for not smoking.   Explore interactive web-based programs that specialize in helping you quit.  4. GET MEDICINE AND USE IT CORRECTLY Medicines can help you stop smoking and decrease the urge to smoke. Combining medicine with the above behavioral methods and support can quadruple your chances of successfully quitting smoking. The U.S. Food and Drug Administration (FDA) has approved 7 medicines to help you quit smoking. These medicines fall into 3 categories.  Nicotine replacement therapy (delivers nicotine to your body without the negative effects and risks of smoking):   Nicotine gum: Available over-the-counter.   Nicotine lozenges: Available over-the-counter.   Nicotine inhaler: Available by prescription.   Nicotine  nasal spray: Available by prescription.   Nicotine skin patches (transdermal): Available by prescription and over-the-counter.   Antidepressant medicine (helps people abstain from smoking, but how this works is unknown):   Bupropion sustained-release (SR) tablets: Available by prescription.   Nicotinic receptor partial agonist (simulates the effect of nicotine in your brain):   Varenicline tartrate tablets: Available by prescription.   Ask your caregiver for advice about which medicines to use and how to use them. Carefully read the information on the package.   Everyone who is trying to quit may benefit from using a medicine. If you are pregnant or trying to become pregnant, nursing an infant, you are under age 48, or you smoke fewer than 10 cigarettes per day, talk to your caregiver before taking any nicotine replacement medicines.   You should stop using a nicotine replacement product and call your caregiver if you experience nausea, dizziness, weakness, vomiting, fast or irregular heartbeat, mouth problems with the lozenge or gum, or redness or swelling of the skin around the patch that does not go away.   Do not use any other product containing nicotine while using a nicotine replacement product.   Talk to your caregiver before using these products if you have diabetes, heart disease, asthma, stomach ulcers, you had a recent heart attack, you have high blood pressure that is not controlled with medicine, a history of irregular heartbeat, or you have been prescribed medicine to help you quit smoking.  5. BE PREPARED FOR RELAPSE OR DIFFICULT SITUATIONS  Most relapses occur within the first 3 months after quitting. Do not be discouraged if you start smoking again. Remember, most people try several times before they finally quit.   You may have symptoms of withdrawal because your body is used to nicotine. You may crave cigarettes, be irritable, feel very hungry, cough often, get headaches,  or have difficulty concentrating.   The withdrawal symptoms are only temporary. They are strongest when you first quit, but they will go away within 10 to 14 days.  Here are some difficult situations to watch for:  Alcohol. Avoid drinking alcohol. Drinking lowers your chances of successfully quitting.   Caffeine. Try to reduce the amount of caffeine you consume. It also lowers your chances of successfully quitting.   Other smokers. Being around smoking can make you want to smoke. Avoid smokers.   Weight gain. Many smokers will gain weight when they quit, usually less than 10 pounds. Eat a healthy  diet and stay active. Do not let weight gain distract you from your main goal, quitting smoking. Some medicines that help you quit smoking may also help delay weight gain. You can always lose the weight gained after you quit.   Bad mood or depression. There are a lot of ways to improve your mood other than smoking.  If you are having problems with any of these situations, talk to your caregiver. SPECIAL SITUATIONS AND CONDITIONS Studies suggest that everyone can quit smoking. Your situation or condition can give you a special reason to quit.  Pregnant women/new mothers: By quitting, you protect your baby's health and your own.   Hospitalized patients: By quitting, you reduce health problems and help healing.   Heart attack patients: By quitting, you reduce your risk of a second heart attack.   Lung, head, and neck cancer patients: By quitting, you reduce your chance of a second cancer.   Parents of children and adolescents: By quitting, you protect your children from illnesses caused by secondhand smoke.  QUESTIONS TO THINK ABOUT Think about the following questions before you try to stop smoking. You may want to talk about your answers with your caregiver.  Why do you want to quit?   If you tried to quit in the past, what helped and what did not?   What will be the most difficult situations  for you after you quit? How will you plan to handle them?   Who can help you through the tough times? Your family? Friends? Caregiver?   What pleasures do you get from smoking? What ways can you still get pleasure if you quit?  Here are some questions to ask your caregiver:  How can you help me to be successful at quitting?   What medicine do you think would be best for me and how should I take it?   What should I do if I need more help?   What is smoking withdrawal like? How can I get information on withdrawal?  Quitting takes hard work and a lot of effort, but you can quit smoking. FOR MORE INFORMATION   Smokefree.gov (http://www.davis-sullivan.com/) provides free, accurate, evidence-based information and professional assistance to help support the immediate and long-term needs of people trying to quit smoking. Document Released: 08/02/2001 Document Revised: 07/28/2011 Document Reviewed: 05/25/2009 [facility] Patient Information 2012 [place], [place].

## 2012-05-18 ENCOUNTER — Other Ambulatory Visit: Payer: Self-pay | Admitting: *Deleted

## 2012-05-18 ENCOUNTER — Other Ambulatory Visit: Payer: Self-pay | Admitting: Specialist

## 2012-05-18 DIAGNOSIS — Z1231 Encounter for screening mammogram for malignant neoplasm of breast: Secondary | ICD-10-CM

## 2012-05-18 MED ORDER — ALBUTEROL SULFATE HFA 108 (90 BASE) MCG/ACT IN AERS: 2.0000 | INHALATION_SPRAY | Freq: Four times a day (QID) | RESPIRATORY_TRACT | Status: DC | PRN

## 2012-05-22 ENCOUNTER — Ambulatory Visit (INDEPENDENT_AMBULATORY_CARE_PROVIDER_SITE_OTHER): Payer: Medicare Other

## 2012-05-22 DIAGNOSIS — Z1231 Encounter for screening mammogram for malignant neoplasm of breast: Secondary | ICD-10-CM

## 2012-05-31 ENCOUNTER — Other Ambulatory Visit: Payer: Self-pay | Admitting: Family Medicine

## 2012-06-12 ENCOUNTER — Ambulatory Visit: Payer: Medicare Other | Admitting: Family Medicine

## 2012-06-14 ENCOUNTER — Other Ambulatory Visit: Payer: Self-pay | Admitting: Family Medicine

## 2012-06-14 NOTE — Telephone Encounter (Signed)
Please advise 

## 2012-06-19 LAB — LIPID PANEL
Cholesterol: 232 mg/dL — ABNORMAL HIGH (ref 0–200)
Total CHOL/HDL Ratio: 5.5 Ratio
Triglycerides: 228 mg/dL — ABNORMAL HIGH (ref <150)
VLDL: 46 mg/dL — ABNORMAL HIGH (ref 0–40)

## 2012-06-19 LAB — COMPLETE METABOLIC PANEL WITH GFR
AST: 12 U/L (ref 0–37)
Albumin: 4.1 g/dL (ref 3.5–5.2)
Alkaline Phosphatase: 74 U/L (ref 39–117)
BUN: 11 mg/dL (ref 6–23)
Potassium: 4.9 mEq/L (ref 3.5–5.3)

## 2012-06-25 ENCOUNTER — Ambulatory Visit: Payer: Medicare Other | Admitting: Family Medicine

## 2012-08-03 ENCOUNTER — Ambulatory Visit (INDEPENDENT_AMBULATORY_CARE_PROVIDER_SITE_OTHER): Payer: Medicare Other | Admitting: Physician Assistant

## 2012-08-03 ENCOUNTER — Encounter: Payer: Self-pay | Admitting: Physician Assistant

## 2012-08-03 VITALS — BP 124/74 | HR 67 | Temp 98.1°F | Ht 65.0 in | Wt 197.0 lb

## 2012-08-03 DIAGNOSIS — J441 Chronic obstructive pulmonary disease with (acute) exacerbation: Secondary | ICD-10-CM

## 2012-08-03 MED ORDER — DOXYCYCLINE HYCLATE 100 MG PO CAPS: 100.0000 mg | ORAL_CAPSULE | Freq: Two times a day (BID) | ORAL | Status: DC

## 2012-08-03 MED ORDER — PREDNISONE 20 MG PO TABS: ORAL_TABLET | ORAL | Status: DC

## 2012-08-03 MED ORDER — HYDROCODONE-HOMATROPINE 5-1.5 MG/5ML PO SYRP: 5.0000 mL | ORAL_SOLUTION | Freq: Every evening | ORAL | Status: DC | PRN

## 2012-08-03 NOTE — Progress Notes (Signed)
  Subjective:    Patient ID: Nancy Cisneros, female    DOB: 1940-09-23, 71 y.o.   MRN: [id number]  HPI 1 week of symptoms.  No fever. Productive. ecig now. ST right ear pain. Using husband spiriva    Review of Systems     Objective:   Physical Exam  Constitutional: She is oriented to person, place, and time. She appears well-developed and well-nourished.  HENT:  Head: Normocephalic and atraumatic.  Right Ear: External ear normal.  Left Ear: External ear normal.  Nose: Nose normal.  Mouth/Throat: Oropharynx is clear and moist. No oropharyngeal exudate.  Eyes: Conjunctivae normal are normal.  Neck: Normal range of motion. Neck supple.  Cardiovascular: Normal rate, regular rhythm and normal heart sounds.   Pulmonary/Chest: No respiratory distress.       Upper lobe inspiratory wheezing bilaterally.. Coarse breath sounds.   Lymphadenopathy:    She has no cervical adenopathy.  Neurological: She is alert and oriented to person, place, and time.  Skin: Skin is warm and dry.  Psychiatric: She has a normal mood and affect. Her behavior is normal.          Assessment & Plan:  COPD exacerbation-Start Doxycyline and Prednisone. Continue albuterol as needed up to every 6 hours. Continue with every day regimen. Call if not improving. Pt also given cough syrup for night time cough per patient request.

## 2012-08-03 NOTE — Patient Instructions (Addendum)
Start Doxycyline and Prednisone. Continue albuterol as needed. Call if not improving.    Chronic Obstructive Pulmonary Disease Chronic obstructive pulmonary disease (COPD) is a condition in which airflow from the lungs is restricted. The lungs can never return to normal, but there are measures you can take which will improve them and make you feel better. CAUSES   Smoking.  Exposure to secondhand smoke.  Breathing in irritants (pollution, cigarette smoke, strong smells, aerosol sprays, paint fumes).  History of lung infections. TREATMENT  Treatment focuses on making you comfortable (supportive care). Your caregiver may prescribe medications (inhaled or pills) to help improve your breathing. HOME CARE INSTRUCTIONS   If you smoke, stop smoking.  Avoid exposure to smoke, chemicals, and fumes that aggravate your breathing.  Take antibiotic medicines as directed by your caregiver.  Avoid medicines that dry up your system and slow down the elimination of secretions (antihistamines and cough syrups). This decreases respiratory capacity and may lead to infections.  Drink enough water and fluids to keep your urine clear or pale yellow. This loosens secretions.  Use humidifiers at home and at your bedside if they do not make breathing difficult.  Receive all protective vaccines your caregiver suggests, especially pneumococcal and influenza.  Use home oxygen as suggested.  Stay active. Exercise and physical activity will help maintain your ability to do things you want to do.  Eat a healthy diet. SEEK MEDICAL CARE IF:   You develop pus-like mucus (sputum).  Breathing is more labored or exercise becomes difficult to do.  You are running out of the medicine you take for your breathing. SEEK IMMEDIATE MEDICAL CARE IF:   You have a rapid heart rate.  You have agitation, confusion, tremors, or are in a stupor (family members may need to observe this).  It becomes difficult to  breathe.  You develop chest pain.  You have a fever. MAKE SURE YOU:   Understand these instructions.  Will watch your condition.  Will get help right away if you are not doing well or get worse. Document Released: 05/18/2005 Document Revised: 10/31/2011 Document Reviewed: 10/08/2010 [facility] Patient Information 2013 [place], [place].

## 2012-08-05 ENCOUNTER — Other Ambulatory Visit: Payer: Self-pay | Admitting: Family Medicine

## 2012-08-06 ENCOUNTER — Other Ambulatory Visit: Payer: Self-pay | Admitting: Family Medicine

## 2012-08-06 MED ORDER — TIOTROPIUM BROMIDE MONOHYDRATE 18 MCG IN CAPS: 18.0000 ug | ORAL_CAPSULE | Freq: Every day | RESPIRATORY_TRACT | Status: DC

## 2012-08-17 ENCOUNTER — Other Ambulatory Visit: Payer: Self-pay | Admitting: Family Medicine

## 2012-10-15 ENCOUNTER — Ambulatory Visit (INDEPENDENT_AMBULATORY_CARE_PROVIDER_SITE_OTHER): Payer: Medicare Other

## 2012-10-15 ENCOUNTER — Ambulatory Visit (INDEPENDENT_AMBULATORY_CARE_PROVIDER_SITE_OTHER): Payer: Medicare Other | Admitting: Family Medicine

## 2012-10-15 ENCOUNTER — Encounter: Payer: Self-pay | Admitting: Family Medicine

## 2012-10-15 VITALS — BP 153/83 | HR 64 | Wt 199.0 lb

## 2012-10-15 DIAGNOSIS — H9201 Otalgia, right ear: Secondary | ICD-10-CM

## 2012-10-15 DIAGNOSIS — R0602 Shortness of breath: Secondary | ICD-10-CM

## 2012-10-15 DIAGNOSIS — R0789 Other chest pain: Secondary | ICD-10-CM

## 2012-10-15 DIAGNOSIS — K219 Gastro-esophageal reflux disease without esophagitis: Secondary | ICD-10-CM

## 2012-10-15 DIAGNOSIS — I10 Essential (primary) hypertension: Secondary | ICD-10-CM

## 2012-10-15 MED ORDER — ESOMEPRAZOLE MAGNESIUM 40 MG PO CPDR: 40.0000 mg | DELAYED_RELEASE_CAPSULE | Freq: Every day | ORAL | Status: DC

## 2012-10-15 MED ORDER — LOSARTAN POTASSIUM 25 MG PO TABS: 25.0000 mg | ORAL_TABLET | Freq: Every day | ORAL | Status: DC

## 2012-10-15 NOTE — Progress Notes (Signed)
Subjective:    Patient ID: Nancy Cisneros, female    DOB: 1941-05-20, 72 y.o.   MRN: [id number]  HPI BP has been running high for about a week. As high as SBP 200.  Noticed lower legs look red but not painful or itchy. No rash.  Did gets BP cuff agianst machine at her church.  Not taking any NSAID or decongestants.  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  Has had some CP that started about a week ago.  Says feels like her chest is hammering and can't get her breath.  Says at night catches herself not breathing.  Right ear is hurting too. No URI or fever sxs.  Did have vomiting/Diarrhea 3 week ago. CP can happen at any time.  Across the mid-chest.  Says last just a few mintues.  Says cold water makes it feel better.  Has been having more heartburn recently.  Has taken some of her husband's prevacid.  No nausea or vomiting with it.  No diaphoresis. No radiataion of pain. She has felt a little dizzy when her blood pressure is high. No diarrhea or contipation.    Review of Systems  BP 153/83  Pulse 64  Wt 199 lb (90.266 kg)  BMI 33.12 kg/m2    Allergies  Allergen Reactions  . Codeine     REACTION: trouble breathing  . Ramipril     REACTION: cough    Past Medical History  Diagnosis Date  . Hip pain, left   . Diverticulosis of colon   . Adrenal adenoma     1 cm Left  . COPD (chronic obstructive pulmonary disease)     lung nodule- Dr Steele Berg  . Hypertension   . Hyperlipidemia   . CAD (coronary artery disease)     Dr Lorenso Courier, [facility] cardiology    Past Surgical History  Procedure Laterality Date  . Lynx mid urethral sling      Dr Doristine Locks    History   Social History  . Marital Status: Married    Spouse Name: N/A    Number of Children: N/A  . Years of Education: N/A   Occupational History  . Not on file.   Social History Main Topics  . Smoking status: Current Every Day Smoker -- 0.40 packs/day    Last Attempt to Quit:  07/22/2008  . Smokeless tobacco: Not on file  . Alcohol Use: No  . Drug Use: Not on file  . Sexually Active: Yes   Other Topics Concern  . Not on file   Social History Narrative  . No narrative on file    Family History  Problem Relation Age of Onset  . Hyperlipidemia Mother   . Hypertension Mother   . Cancer Father     Oat cell lung CA  . Alcohol abuse Father     Outpatient Encounter Prescriptions as of 10/15/2012  Medication Sig Dispense Refill  . albuterol (PROAIR HFA) 108 (90 BASE) MCG/ACT inhaler Inhale 2 puffs into the lungs every 6 (six) hours as needed.  1 Inhaler  1  . AMBULATORY NON FORMULARY MEDICATION Medication Name: Zostavax IM x 1  1 vial  0  . amLODipine (NORVASC) 5 MG tablet TAKE 1 TABLET (5 MG TOTAL) BY MOUTH DAILY.  90 tablet  0  . aspirin 81 MG tablet Take 81 mg by mouth daily.        . budesonide-formoterol (SYMBICORT)  160-4.5 MCG/ACT inhaler Inhale 2 puffs into the lungs 2 (two) times daily.  1 Inhaler  0  . citalopram (CELEXA) 40 MG tablet TAKE 1 TABLET BY MOUTH EVERY DAY  90 tablet  1  . diclofenac (VOLTAREN) 50 MG EC tablet Take 50 mg by mouth 2 (two) times daily.      Marland Kitchen gabapentin (NEURONTIN) 300 MG capsule Take 300 mg by mouth 2 (two) times daily.      . metoprolol succinate (TOPROL-XL) 100 MG 24 hr tablet TAKE 1 TABLET (100 MG TOTAL) BY MOUTH DAILY. TAKE WITH OR IMMEDIATELY FOLLOWING A MEAL.  90 tablet  0  . tiotropium (SPIRIVA HANDIHALER) 18 MCG inhalation capsule Place 1 capsule (18 mcg total) into inhaler and inhale daily.  30 capsule  6  . tiZANidine (ZANAFLEX) 4 MG capsule Take 4 mg by mouth at bedtime.      . [DISCONTINUED] doxycycline (VIBRAMYCIN) 100 MG capsule Take 1 capsule (100 mg total) by mouth 2 (two) times daily. For 10 days.  20 capsule  0  . [DISCONTINUED] HYDROcodone-homatropine (HYCODAN) 5-1.5 MG/5ML syrup Take 5 mLs by mouth at bedtime as needed for cough.  180 mL  0  . [DISCONTINUED] HYDROcodone-homatropine (HYCODAN) 5-1.5 MG/5ML  syrup Take 5 mLs by mouth at bedtime as needed for cough.  120 mL  0  . [DISCONTINUED] predniSONE (DELTASONE) 20 MG tablet Take 2 tabs once a day for 5 days.  10 tablet  0  . esomeprazole (NEXIUM) 40 MG capsule Take 1 capsule (40 mg total) by mouth daily.  30 capsule  1  . esomeprazole (NEXIUM) 40 MG capsule Take 1 capsule (40 mg total) by mouth daily.  10 capsule  0  . losartan (COZAAR) 25 MG tablet Take 1 tablet (25 mg total) by mouth daily.  30 tablet  0  . [DISCONTINUED] esomeprazole (NEXIUM) 40 MG capsule Take 1 capsule (40 mg total) by mouth daily.  14 capsule  1   No facility-administered encounter medications on file as of 10/15/2012.          Objective:   Physical Exam  Constitutional: She is oriented to person, place, and time. She appears well-developed and well-nourished.  HENT:  Head: Normocephalic and atraumatic.  Right Ear: External ear normal.  Left Ear: External ear normal.  Nose: Nose normal.  Mouth/Throat: Oropharynx is clear and moist.  TMs and canals are clear. Right TM is partially blockec by cerumen.   Eyes: Conjunctivae and EOM are normal. Pupils are equal, round, and reactive to light.  Neck: Neck supple. No thyromegaly present.  Cardiovascular: Normal rate, regular rhythm and normal heart sounds.   Pulmonary/Chest: Effort normal and breath sounds normal. She has no wheezes. She exhibits tenderness.  Tender over the mid sternum.   Abdominal: Soft. Bowel sounds are normal. She exhibits no distension and no mass. There is tenderness. There is no rebound and no guarding.  Tender in the right upper quadrant and epigastrium. Also tender along the chest wall.  Musculoskeletal: She exhibits edema.  Trace LE edema bilat, worse on the left.   Lymphadenopathy:    She has no cervical adenopathy.  Neurological: She is alert and oriented to person, place, and time.  Skin: Skin is warm and dry.  Psychiatric: She has a normal mood and affect. Her behavior is normal.  Judgment and thought content normal.          Assessment & Plan:  HTN - Uncontrolled. Check thyroid, CMP. She does have some  lotion edema which could be resulting from increased blood pressure. She reports she's not taking any NSAIDs if she does have diclofenac on her list. Make sure that she's avoiding taking that particular medication. She's also not taking any decongestants and denies having any recent changes or increases on her perception medications. We'll do an EKG today for further evaluation. We'll also check cardiac enzymes. Will check BNP as well. No prior history of congestive heart failure. Will add losartan 25 mg to her regimen. She had a cough with ramipril in the past.  GERD- Uncontrolled. Given samples of Nexium to start once a day. We'll see if the next week she feels better. Followup in one week.  Atypical chest pain-could be related to her reflux. Will start Nexium. Samples given. EKG today shows rate of 60 beats per minute, normal sinus rhythm with normal axis and no acute changes. This is reassuring we will check cardiac enzymes, TSH, CMP. She's to stop her diclofenac. Check BMP. She does have a history of coronary artery disease and is followed by Dr. Lorenso Courier at [facility] cardiology.  Lower extremity edema-see hypertension and atypical chest pain. This could be secondary to recent elevation of her blood pressure will check kidney function and thyroid as well.  Right otalgia-exam is fairly normal the am not able to see to complete TM but what I can see appears to be normal. Call if any fever, cold symptoms or suddenly gets worse. I offered to irrigate the ear today but she declined and said she can do that on her own at home.

## 2012-10-15 NOTE — Patient Instructions (Signed)
Stop your diclofenac.   Start the nexium samples

## 2012-10-16 LAB — CK TOTAL AND CKMB (NOT AT ARMC): Total CK: 55 U/L (ref 7–177)

## 2012-10-16 LAB — COMPLETE METABOLIC PANEL WITH GFR
Albumin: 4.2 g/dL (ref 3.5–5.2)
Alkaline Phosphatase: 66 U/L (ref 39–117)
CO2: 28 mEq/L (ref 19–32)
Chloride: 105 mEq/L (ref 96–112)
GFR, Est Non African American: 57 mL/min — ABNORMAL LOW
Glucose, Bld: 85 mg/dL (ref 70–99)
Potassium: 5.1 mEq/L (ref 3.5–5.3)
Sodium: 140 mEq/L (ref 135–145)
Total Protein: 6.3 g/dL (ref 6.0–8.3)

## 2012-10-16 LAB — BRAIN NATRIURETIC PEPTIDE: Brain Natriuretic Peptide: 44.3 pg/mL (ref 0.0–100.0)

## 2012-10-16 LAB — TROPONIN I: Troponin I: <0.01 ng/mL (ref <0.06)

## 2012-10-17 ENCOUNTER — Encounter: Payer: Self-pay | Admitting: *Deleted

## 2012-10-18 ENCOUNTER — Ambulatory Visit: Payer: Medicare Other | Admitting: Family Medicine

## 2012-10-22 ENCOUNTER — Ambulatory Visit: Payer: Medicare Other | Admitting: Family Medicine

## 2012-10-26 ENCOUNTER — Ambulatory Visit: Payer: Medicare Other | Admitting: Family Medicine

## 2012-11-01 ENCOUNTER — Ambulatory Visit (INDEPENDENT_AMBULATORY_CARE_PROVIDER_SITE_OTHER): Payer: Medicare Other | Admitting: Family Medicine

## 2012-11-01 ENCOUNTER — Encounter: Payer: Self-pay | Admitting: Family Medicine

## 2012-11-01 VITALS — BP 133/72 | HR 73 | Wt 198.0 lb

## 2012-11-01 DIAGNOSIS — K219 Gastro-esophageal reflux disease without esophagitis: Secondary | ICD-10-CM

## 2012-11-01 DIAGNOSIS — I10 Essential (primary) hypertension: Secondary | ICD-10-CM

## 2012-11-01 NOTE — Patient Instructions (Addendum)
Call your insurance and see if will cover the shingles vaccine.

## 2012-11-01 NOTE — Progress Notes (Signed)
  Subjective:    Patient ID: Nancy Cisneros, female    DOB: 11/06/1940, 72 y.o.   MRN: [id number]  HPI  She was in one to 2 weeks ago with atypical chest pain. The time it was felt like it was most likely reflux related. She was started on Nexium.  GERD - Says her reflux and taypical chest pain is much improved.  > 50% better. Taking her nexium every day and tolerating it well without any side effects. This provided a great deal of symptom relief. Her chest pain has resolved.Marland Kitchen    HTN-  Pt denies chest pain, SOB, dizziness, or heart palpitations.  Taking meds as directed w/o problems.  Denies medication side effects.Did stop her diclofenac.  We have started losartan at the last office visit, for uncontrolled blood pressure. She's tolerating it well without any side effects.  Still having some right ear pain. Did use dome peroxide at home. She denies any hearing loss or drainage. No recent cold symptoms or fever. She has had a little pressure in her right maxillary sinus as well. She has not tried any over-the-counter antihistamines. She is a smoker.  Review of Systems     Objective:   Physical Exam  Constitutional: She is oriented to person, place, and time. She appears well-developed and well-nourished.  HENT:  Head: Normocephalic and atraumatic.  Right Ear: External ear normal.  Left Ear: External ear normal.  Nose: Nose normal.  TMs and canals are clear.   Eyes: Conjunctivae and EOM are normal. Pupils are equal, round, and reactive to light.  Neck: Neck supple. No thyromegaly present.  Cardiovascular: Normal rate, regular rhythm and normal heart sounds.   Pulmonary/Chest: Effort normal and breath sounds normal. She has no wheezes.  Lymphadenopathy:    She has no cervical adenopathy.  Neurological: She is alert and oriented to person, place, and time.  Skin: Skin is warm and dry.  Psychiatric: She has a normal mood and affect.          Assessment & Plan:  GERD - Well controlled  on nexium. Conitnue for 6 weeks nd then wean if doing well. After 6 weeks can decrease to every other day and then slowly taper to every third day. Call if symptoms suddenly not well controlled   HTN - her blood pressure looks beautiful today. Recommend compression stocking.  Well controlled. Tolearting the losatan , without any side effects . Followup in 4 months to recheck blood pressure  Right otalgia-likely related to her sinus pressure. Ear exam itself is normal the am not able to see about a third of the right side of the tympanic membrane. Otherwise no bulging or erythema. She certainly could try an over-the-counter antihistamine such as Claritin or Allegra for a week or 2 and see if it provides relief. If not then let me know. Or for facial pain or pressure gets worse or she starts to spike a fever then please let me know. Encourage smoking cessation.  Atypical chest pain-I. do feel this is reflux related and her chest discomfort has resolved.

## 2012-11-05 ENCOUNTER — Other Ambulatory Visit: Payer: Self-pay | Admitting: Family Medicine

## 2012-11-19 ENCOUNTER — Other Ambulatory Visit: Payer: Self-pay | Admitting: Family Medicine

## 2012-12-11 ENCOUNTER — Other Ambulatory Visit: Payer: Self-pay | Admitting: Family Medicine

## 2013-01-14 ENCOUNTER — Other Ambulatory Visit: Payer: Self-pay | Admitting: Family Medicine

## 2013-02-12 ENCOUNTER — Other Ambulatory Visit: Payer: Self-pay | Admitting: Family Medicine

## 2013-02-13 ENCOUNTER — Telehealth: Payer: Self-pay | Admitting: *Deleted

## 2013-02-13 MED ORDER — HYDROCODONE-ACETAMINOPHEN 5-325 MG PO TABS: 1.0000 | ORAL_TABLET | Freq: Four times a day (QID) | ORAL | Status: DC | PRN

## 2013-02-13 MED ORDER — LIDOCAINE HCL 2 % EX GEL: Freq: Three times a day (TID) | CUTANEOUS | Status: DC | PRN

## 2013-02-13 NOTE — Telephone Encounter (Signed)
Pt would like both and to use oral pain med only if needed

## 2013-02-13 NOTE — Telephone Encounter (Signed)
rx sent to pharm

## 2013-02-13 NOTE — Telephone Encounter (Signed)
Okay. Does want to make sure that she took the famciclovir for 7 days of the typical course. Now it's just a matter of controlling her pain until the rash completes itself. We can do topical lidocaine and even a narcotic pain medication if needed if she feels that her pain is more moderate to severe. Please let me know what she would like to do.

## 2013-02-13 NOTE — Telephone Encounter (Signed)
Pt calls and states she was seen at [facility] ED on Sunday morning with shingles. They are under her right breast and wrap around to right side of back- rash is still present but no more blisters but in a lot of pain and extreme burning. Given prednisone and Famcyclovir x 1 week and has finished this course of meds.  Called hospital and request her records be sent to you for review. Barry Dienes, LPN

## 2013-02-13 NOTE — Telephone Encounter (Signed)
Pt notified and med sent . Barry Dienes, LPN

## 2013-02-21 ENCOUNTER — Other Ambulatory Visit: Payer: Self-pay | Admitting: Family Medicine

## 2013-02-21 MED ORDER — HYDROCODONE-ACETAMINOPHEN 5-325 MG PO TABS
1.0000 | ORAL_TABLET | Freq: Four times a day (QID) | ORAL | Status: DC | PRN
Start: 2013-02-21 — End: 2014-11-14

## 2013-02-21 MED ORDER — GABAPENTIN 300 MG PO CAPS: 300.0000 mg | ORAL_CAPSULE | Freq: Two times a day (BID) | ORAL | Status: DC

## 2013-02-21 NOTE — Telephone Encounter (Signed)
Both rx sent  ?

## 2013-02-21 NOTE — Telephone Encounter (Addendum)
Typically we can use amitriptyline or Neurontin for the nerve pain. It looks like Neurontin is Re: on her list but says it's historical. Please see if she is still taking this. The generic name is gabapentin. If she is not then I will send her for new prescription. Also we can send over a small quantity of hydrocodone to use as well.

## 2013-02-21 NOTE — Addendum Note (Signed)
Addended by: Nani Gasser D on: 02/21/2013 12:11 PM   Modules accepted: Orders

## 2013-02-21 NOTE — Telephone Encounter (Signed)
Pt notified and would like the gabapentin and the hydrocodone sent to [facility] for her. She states she has good days and bad days with it.

## 2013-02-21 NOTE — Telephone Encounter (Signed)
Called pt and she said that she is still having pain however not as much. Asked her if the gel is working she said no.  Forward to Dr.Metheney for advice.Loralee Pacas [place]

## 2013-03-04 ENCOUNTER — Encounter: Payer: Self-pay | Admitting: Family Medicine

## 2013-03-04 ENCOUNTER — Ambulatory Visit (INDEPENDENT_AMBULATORY_CARE_PROVIDER_SITE_OTHER): Payer: Medicare Other | Admitting: Family Medicine

## 2013-03-04 VITALS — BP 168/78 | HR 85 | Ht 65.0 in | Wt 193.0 lb

## 2013-03-04 DIAGNOSIS — B0229 Other postherpetic nervous system involvement: Secondary | ICD-10-CM

## 2013-03-04 DIAGNOSIS — I10 Essential (primary) hypertension: Secondary | ICD-10-CM

## 2013-03-04 DIAGNOSIS — E785 Hyperlipidemia, unspecified: Secondary | ICD-10-CM

## 2013-03-04 MED ORDER — AMBULATORY NON FORMULARY MEDICATION: Status: DC

## 2013-03-04 NOTE — Progress Notes (Signed)
  Subjective:    Patient ID: Nancy Cisneros, female    DOB: Sep 22, 1940, 72 y.o.   MRN: [id number]  HPI HTN -  Pt denies chest pain, SOB, dizziness, or heart palpitations.  Taking meds as directed w/o problems.  Denies medication side effects.  No regular exercise.   Recently had bought of shingles under the right breat.  Still sore to touch. She did appreciate the Neurontin that we call him. Was helpful. She says she discontinued it a few days ago because she has been feeling better. She's just now been applying moisturizer. She says she wouldn't wish this on her worst enemy. And she is interested in still getting the vaccine.  COPD- no flares recently. Doing well with her inhalers.  Using her spiriva.   Review of Systems     Objective:   Physical Exam  Constitutional: She is oriented to person, place, and time. She appears well-developed and well-nourished.  HENT:  Head: Normocephalic and atraumatic.  Cardiovascular: Normal rate, regular rhythm and normal heart sounds.   Pulmonary/Chest: Effort normal and breath sounds normal.  Coarse BS  Neurological: She is alert and oriented to person, place, and time.  Skin: Skin is warm and dry.  Psychiatric: She has a normal mood and affect. Her behavior is normal.          Assessment & Plan:  hTN - Uncontrolled. Her blood pressure looks fantastic about 3 months ago. Make sure continuing to work on healthy diet low salt diet. She says she has not been exercising because of the shingles. Encouraged her to get back in her routine and follow back up a month to make sure her numbers are coming back down. If not then we'll need to adjust her medication.  Post herpetic neuralgia -She is off the neurontin now. She is just using moisturizer on it.    COPD- She has quit smoking but husband is still smoking. She is doing well. No recent flares.

## 2013-03-05 ENCOUNTER — Other Ambulatory Visit: Payer: Self-pay | Admitting: *Deleted

## 2013-03-05 DIAGNOSIS — E785 Hyperlipidemia, unspecified: Secondary | ICD-10-CM

## 2013-03-05 LAB — COMPLETE METABOLIC PANEL WITH GFR
CO2: 23 mEq/L (ref 19–32)
Creat: 0.87 mg/dL (ref 0.50–1.10)
GFR, Est African American: 76 mL/min
GFR, Est Non African American: 66 mL/min
Glucose, Bld: 75 mg/dL (ref 70–99)
Total Bilirubin: 0.4 mg/dL (ref 0.3–1.2)

## 2013-03-05 LAB — LIPID PANEL
HDL: 42 mg/dL (ref >39)
Triglycerides: 217 mg/dL — ABNORMAL HIGH (ref <150)

## 2013-03-11 ENCOUNTER — Other Ambulatory Visit: Payer: Self-pay | Admitting: Family Medicine

## 2013-03-12 ENCOUNTER — Telehealth: Payer: Self-pay | Admitting: *Deleted

## 2013-03-12 NOTE — Telephone Encounter (Signed)
Pt wanted to know about shingles vaccination. She will come in on Thursday to have this done.Loralee Pacas [place]

## 2013-03-14 ENCOUNTER — Ambulatory Visit (INDEPENDENT_AMBULATORY_CARE_PROVIDER_SITE_OTHER): Payer: Medicare Other | Admitting: Family Medicine

## 2013-03-14 VITALS — Temp 98.7°F

## 2013-03-14 DIAGNOSIS — Z23 Encounter for immunization: Secondary | ICD-10-CM

## 2013-03-14 NOTE — Progress Notes (Signed)
Pt has not had any more problems with Post herpetic neuralgia. Shingles vaccination tolerated well.Nancy Cisneros [place]

## 2013-03-21 ENCOUNTER — Other Ambulatory Visit: Payer: Self-pay | Admitting: Family Medicine

## 2013-04-15 ENCOUNTER — Other Ambulatory Visit: Payer: Self-pay | Admitting: Family Medicine

## 2013-04-15 DIAGNOSIS — Z1231 Encounter for screening mammogram for malignant neoplasm of breast: Secondary | ICD-10-CM

## 2013-04-17 ENCOUNTER — Other Ambulatory Visit: Payer: Self-pay | Admitting: Family Medicine

## 2013-04-25 ENCOUNTER — Other Ambulatory Visit: Payer: Self-pay | Admitting: Family Medicine

## 2013-05-14 ENCOUNTER — Other Ambulatory Visit: Payer: Self-pay | Admitting: Family Medicine

## 2013-06-11 ENCOUNTER — Ambulatory Visit: Payer: Medicare Other

## 2013-06-12 ENCOUNTER — Other Ambulatory Visit: Payer: Self-pay | Admitting: Family Medicine

## 2013-06-13 ENCOUNTER — Ambulatory Visit: Payer: Medicare Other

## 2013-06-13 ENCOUNTER — Ambulatory Visit (INDEPENDENT_AMBULATORY_CARE_PROVIDER_SITE_OTHER): Payer: Medicare Other

## 2013-06-13 DIAGNOSIS — Z1231 Encounter for screening mammogram for malignant neoplasm of breast: Secondary | ICD-10-CM

## 2013-06-14 ENCOUNTER — Telehealth: Payer: Self-pay | Admitting: *Deleted

## 2013-06-14 NOTE — Telephone Encounter (Signed)
Called and lvm informing pt that her forms were completed and ready for p/u.Nancy Cisneros

## 2013-06-24 ENCOUNTER — Ambulatory Visit (INDEPENDENT_AMBULATORY_CARE_PROVIDER_SITE_OTHER): Payer: Medicare Other | Admitting: Family Medicine

## 2013-06-24 ENCOUNTER — Encounter: Payer: Self-pay | Admitting: Family Medicine

## 2013-06-24 VITALS — BP 125/65 | HR 73 | Wt 187.0 lb

## 2013-06-24 DIAGNOSIS — J441 Chronic obstructive pulmonary disease with (acute) exacerbation: Secondary | ICD-10-CM

## 2013-06-24 DIAGNOSIS — J209 Acute bronchitis, unspecified: Secondary | ICD-10-CM

## 2013-06-24 MED ORDER — DOXYCYCLINE HYCLATE 100 MG PO TABS: 100.0000 mg | ORAL_TABLET | Freq: Two times a day (BID) | ORAL | Status: DC

## 2013-06-24 MED ORDER — HYDROCODONE-HOMATROPINE 5-1.5 MG/5ML PO SYRP: 5.0000 mL | ORAL_SOLUTION | Freq: Every evening | ORAL | Status: DC | PRN

## 2013-06-24 MED ORDER — PREDNISONE 20 MG PO TABS: ORAL_TABLET | ORAL | Status: DC

## 2013-06-24 NOTE — Progress Notes (Signed)
  Subjective:    Patient ID: Nancy Cisneros, female    DOB: 1941/06/29, 72 y.o.   MRN: [id number]  HPI Dry Cough and wheezing for 8-9  days. Her given a Z-Pak approximately 5 days ago. Just completed it. She's really not feeling a lot better. History of COPD, and chronic bronchitis.  Had this the day after her colonoscopy.  No fever, chills, or sweats.  Mild ST.  No ear pain or pressure.  No OTC cough meds. Using honey.  Using her spiriva at night. Not using her symbicort.     Review of Systems     Objective:   Physical Exam  Constitutional: She is oriented to person, place, and time. She appears well-developed and well-nourished.  HENT:  Head: Normocephalic and atraumatic.  Right Ear: External ear normal.  Left Ear: External ear normal.  Nose: Nose normal.  Mouth/Throat: Oropharynx is clear and moist.  TMs and canals are clear.   Eyes: Conjunctivae and EOM are normal. Pupils are equal, round, and reactive to light.  Neck: Neck supple. No thyromegaly present.  Cardiovascular: Normal rate, regular rhythm and normal heart sounds.   Pulmonary/Chest: Effort normal.  Expiratory diffuse rhonchi.   Lymphadenopathy:    She has no cervical adenopathy.  Neurological: She is alert and oriented to person, place, and time.  Skin: Skin is warm and dry.  Psychiatric: She has a normal mood and affect.          Assessment & Plan:  COPD exacerbation - will treat with prednisone and doxycycline. Given Solu-Medrol 125 mg IM x1 Followup in one week to make sure that she's improving. Encouraged her restart her Symbicort. She started using her Spiriva. Use albuterol every 46 hours as needed.

## 2013-06-24 NOTE — Patient Instructions (Addendum)
Restart your symbicort Use albuterol every 4 to 6 hours as needed for shortness of breath or wheezing. Consider a probiotic

## 2013-06-25 MED ORDER — METHYLPREDNISOLONE SODIUM SUCC 125 MG IJ SOLR
125.0000 mg | Freq: Once | INTRAMUSCULAR | Status: AC
  Administered 2013-06-24: 125 mg via INTRAMUSCULAR

## 2013-06-25 NOTE — Addendum Note (Signed)
Addended by: Meyer Cory R on: 06/25/2013 09:01 AM   Modules accepted: Orders

## 2013-07-01 ENCOUNTER — Ambulatory Visit (INDEPENDENT_AMBULATORY_CARE_PROVIDER_SITE_OTHER): Payer: Medicare Other | Admitting: Family Medicine

## 2013-07-01 ENCOUNTER — Encounter: Payer: Self-pay | Admitting: Family Medicine

## 2013-07-01 ENCOUNTER — Ambulatory Visit (INDEPENDENT_AMBULATORY_CARE_PROVIDER_SITE_OTHER): Payer: Medicare Other

## 2013-07-01 VITALS — BP 134/77 | HR 67 | Temp 97.6°F | Wt 188.0 lb

## 2013-07-01 DIAGNOSIS — J209 Acute bronchitis, unspecified: Secondary | ICD-10-CM

## 2013-07-01 DIAGNOSIS — K5909 Other constipation: Secondary | ICD-10-CM

## 2013-07-01 DIAGNOSIS — J441 Chronic obstructive pulmonary disease with (acute) exacerbation: Secondary | ICD-10-CM

## 2013-07-01 DIAGNOSIS — R490 Dysphonia: Secondary | ICD-10-CM

## 2013-07-01 DIAGNOSIS — R05 Cough: Secondary | ICD-10-CM

## 2013-07-01 MED ORDER — IPRATROPIUM-ALBUTEROL 0.5-2.5 (3) MG/3ML IN SOLN
3.0000 mL | Freq: Once | RESPIRATORY_TRACT | Status: AC
  Administered 2013-07-01: 3 mL via RESPIRATORY_TRACT

## 2013-07-01 MED ORDER — PREDNISONE 20 MG PO TABS: ORAL_TABLET | ORAL | Status: DC

## 2013-07-01 MED ORDER — ALBUTEROL SULFATE HFA 108 (90 BASE) MCG/ACT IN AERS: 2.0000 | INHALATION_SPRAY | Freq: Four times a day (QID) | RESPIRATORY_TRACT | Status: DC | PRN

## 2013-07-01 NOTE — Progress Notes (Signed)
Subjective:    Patient ID: Nancy Cisneros, female    DOB: 17-Jan-1941, 72 y.o.   MRN: [id number]  HPI Followup for COPD exacerbation-she really is still not feeling a lot better. Still feels short of breath activity. Coughing excessively, been mostly a dry cough. She is hearing a lot of wheezing in her chest. She's actually been sleeping in her chance of her bed because of the wheezing in her chest at night. She has been using her Spiriva and her Symbicort. She still has a raspy voice. No sore throat. No ear pain. No nasal congestion. No headaches. She feels extremely fatigued. No fevers chills or sweats. She feels like she just can't get over the cough. She has been using nighttime cough syrup and now feels a little bit constipated.   Review of Systems     BP 134/77  Pulse 67  Temp(Src) 97.6 F (36.4 C) (Oral)  Wt 188 lb (85.276 kg)  SpO2 95%    Allergies  Allergen Reactions  . Codeine     REACTION: trouble breathing  . Ramipril     REACTION: cough    Past Medical History  Diagnosis Date  . Hip pain, left   . Diverticulosis of colon   . Adrenal adenoma     1 cm Left  . COPD (chronic obstructive pulmonary disease)     lung nodule- Dr Steele Berg  . Hypertension   . Hyperlipidemia   . CAD (coronary artery disease)     Dr Lorenso Courier, [facility] cardiology    Past Surgical History  Procedure Laterality Date  . Lynx mid urethral sling      Dr Doristine Locks    History   Social History  . Marital Status: Married    Spouse Name: N/A    Number of Children: N/A  . Years of Education: N/A   Occupational History  . Not on file.   Social History Main Topics  . Smoking status: Current Every Day Smoker -- 0.40 packs/day    Last Attempt to Quit: 07/22/2008  . Smokeless tobacco: Not on file  . Alcohol Use: No  . Drug Use: Not on file  . Sexual Activity: Yes   Other Topics Concern  . Not on file   Social History Narrative  . No narrative on file    Family History  Problem  Relation Age of Onset  . Hyperlipidemia Mother   . Hypertension Mother   . Cancer Father     Oat cell lung CA  . Alcohol abuse Father     Outpatient Encounter Prescriptions as of 07/01/2013  Medication Sig  . albuterol (PROAIR HFA) 108 (90 BASE) MCG/ACT inhaler Inhale 2 puffs into the lungs every 6 (six) hours as needed.  Marland Kitchen amLODipine (NORVASC) 5 MG tablet TAKE 1 TABLET (5 MG TOTAL) BY MOUTH DAILY.  Marland Kitchen aspirin 81 MG tablet Take 81 mg by mouth daily.    . budesonide-formoterol (SYMBICORT) 160-4.5 MCG/ACT inhaler Inhale 2 puffs into the lungs 2 (two) times daily.  . citalopram (CELEXA) 40 MG tablet TAKE 1 TABLET BY MOUTH EVERY DAY  . diclofenac (VOLTAREN) 50 MG EC tablet Take 50 mg by mouth 2 (two) times daily.  Marland Kitchen gabapentin (NEURONTIN) 300 MG capsule Take 1 capsule (300 mg total) by mouth 2 (two) times daily.  Marland Kitchen HYDROcodone-acetaminophen (NORCO/VICODIN) 5-325 MG per tablet Take 1-2 tablets by mouth every 6 (six) hours as needed for pain.  Marland Kitchen HYDROcodone-homatropine (HYCODAN) 5-1.5 MG/5ML syrup Take 5 mLs by mouth  at bedtime as needed for cough.  . lidocaine (XYLOCAINE) 2 % jelly Apply topically 3 (three) times daily as needed.  Marland Kitchen losartan (COZAAR) 25 MG tablet TAKE 1 TABLET (25 MG TOTAL) BY MOUTH DAILY.  . metoprolol succinate (TOPROL-XL) 100 MG 24 hr tablet TAKE 1 TABLET (100 MG TOTAL) BY MOUTH DAILY. TAKE WITH OR IMMEDIATELY FOLLOWING A MEAL.  Marland Kitchen NEXIUM 40 MG capsule TAKE 1 CAPSULE (40 MG TOTAL) BY MOUTH DAILY.  Marland Kitchen predniSONE (DELTASONE) 20 MG tablet 2 tabs po QD x 5 days then one a day x 5 days.  Marland Kitchen tiotropium (SPIRIVA HANDIHALER) 18 MCG inhalation capsule Place 1 capsule (18 mcg total) into inhaler and inhale daily.  Marland Kitchen tiZANidine (ZANAFLEX) 4 MG capsule Take 4 mg by mouth at bedtime.  . [DISCONTINUED] albuterol (PROAIR HFA) 108 (90 BASE) MCG/ACT inhaler Inhale 2 puffs into the lungs every 6 (six) hours as needed.  . [DISCONTINUED] doxycycline (VIBRA-TABS) 100 MG tablet Take 1 tablet (100 mg  total) by mouth 2 (two) times daily.  . [DISCONTINUED] predniSONE (DELTASONE) 20 MG tablet 2 tabs po QD x 5 days then one a day x 5 days.  . [EXPIRED] ipratropium-albuterol (DUONEB) 0.5-2.5 (3) MG/3ML nebulizer solution 3 mL        Objective:   Physical Exam  Constitutional: She is oriented to person, place, and time. She appears well-developed and well-nourished.  HENT:  Head: Normocephalic and atraumatic.  Right Ear: External ear normal.  Left Ear: External ear normal.  Nose: Nose normal.  Mouth/Throat: Oropharynx is clear and moist.  TMs and canals are clear.   Eyes: Conjunctivae and EOM are normal. Pupils are equal, round, and reactive to light.  Neck: Neck supple. No thyromegaly present.  Cardiovascular: Normal rate, regular rhythm and normal heart sounds.   Pulmonary/Chest: Effort normal. She has wheezes.  Diffuse rhonchit  Lymphadenopathy:    She has no cervical adenopathy.  Neurological: She is alert and oriented to person, place, and time.  Skin: Skin is warm and dry.  Psychiatric: She has a normal mood and affect.          Assessment & Plan:  Acute bronchitis/COPD exacerbation-given nebulizer treatment here in the office with albuterol and Atrovent. We'll repeat course of steroids. Was sent for chest x-ray to evaluate for pneumonia since she is Re: been on a course of azithromycin as well as doxycycline and has not improved significantly. I did go ahead and refill the steroids and will await chest x-ray results with worsening of an antibiotic. Encouraged her to be liberal with her albuterol. Every 4-6 hours as needed.  hoarseness-Maybe related to the acute illness but if not improving then consider treating with Diflucan for possible rash, as she does use inhaled corticosteroids.  Constipation-can use OTC stool softners. Make sure hydrating well. Discontinue the medication when able.

## 2013-07-03 ENCOUNTER — Encounter: Payer: Self-pay | Admitting: Family Medicine

## 2013-07-09 ENCOUNTER — Encounter: Payer: Self-pay | Admitting: Family Medicine

## 2013-07-22 ENCOUNTER — Other Ambulatory Visit: Payer: Self-pay | Admitting: Family Medicine

## 2013-07-30 ENCOUNTER — Ambulatory Visit (INDEPENDENT_AMBULATORY_CARE_PROVIDER_SITE_OTHER): Payer: Medicare Other | Admitting: Family Medicine

## 2013-07-30 ENCOUNTER — Encounter: Payer: Self-pay | Admitting: Family Medicine

## 2013-07-30 VITALS — BP 136/67 | HR 74 | Temp 97.5°F | Ht 65.0 in | Wt 193.0 lb

## 2013-07-30 DIAGNOSIS — J440 Chronic obstructive pulmonary disease with acute lower respiratory infection: Secondary | ICD-10-CM

## 2013-07-30 DIAGNOSIS — Z720 Tobacco use: Secondary | ICD-10-CM

## 2013-07-30 DIAGNOSIS — J209 Acute bronchitis, unspecified: Secondary | ICD-10-CM

## 2013-07-30 DIAGNOSIS — J441 Chronic obstructive pulmonary disease with (acute) exacerbation: Secondary | ICD-10-CM

## 2013-07-30 DIAGNOSIS — F17200 Nicotine dependence, unspecified, uncomplicated: Secondary | ICD-10-CM

## 2013-07-30 MED ORDER — LEVOFLOXACIN 500 MG PO TABS: 500.0000 mg | ORAL_TABLET | Freq: Every day | ORAL | Status: AC

## 2013-07-30 MED ORDER — IPRATROPIUM-ALBUTEROL 0.5-2.5 (3) MG/3ML IN SOLN
3.0000 mL | Freq: Once | RESPIRATORY_TRACT | Status: AC
  Administered 2013-07-30: 3 mL via RESPIRATORY_TRACT

## 2013-07-30 MED ORDER — PREDNISONE 20 MG PO TABS: 40.0000 mg | ORAL_TABLET | Freq: Every day | ORAL | Status: DC

## 2013-07-30 NOTE — Addendum Note (Signed)
Addended by: Deno Etienne on: 07/30/2013 04:24 PM   Modules accepted: Orders

## 2013-07-30 NOTE — Progress Notes (Signed)
Subjective:    Patient ID: Nancy Cisneros, female    DOB: 03/10/1941, 72 y.o.   MRN: [id number]  HPI Seen and treated for for acute bronchitis with COPD exacerbation proximally 4 weeks ago. Cough started again last week but it is more dry and still productive. She says it is a hacking cough. No fever. No sore throat or ear pain. She has not been taking any additional medications. She has felt a wheezing in her chest. She says it's worse at night when she lays down. She feels like there sputum stuck in her main bronchus but just not coming out. She says she's been using her Symbicort and Spiriva regularly. She's also been using her albuterol but she has not used it today.   Review of Systems BP 136/67  Pulse 74  Temp(Src) 97.5 F (36.4 C)  Ht 5\' 5"  (1.651 m)  Wt 193 lb (87.544 kg)  BMI 32.12 kg/m2    Allergies  Allergen Reactions  . Codeine     REACTION: trouble breathing  . Ramipril     REACTION: cough    Past Medical History  Diagnosis Date  . Hip pain, left   . Diverticulosis of colon   . Adrenal adenoma     1 cm Left  . COPD (chronic obstructive pulmonary disease)     lung nodule- Dr Steele Berg  . Hypertension   . Hyperlipidemia   . CAD (coronary artery disease)     Dr Lorenso Courier, [facility] cardiology    Past Surgical History  Procedure Laterality Date  . Lynx mid urethral sling      Dr Doristine Locks    History   Social History  . Marital Status: Married    Spouse Name: N/A    Number of Children: N/A  . Years of Education: N/A   Occupational History  . Not on file.   Social History Main Topics  . Smoking status: Current Every Day Smoker -- 0.40 packs/day    Last Attempt to Quit: 07/22/2008  . Smokeless tobacco: Not on file  . Alcohol Use: No  . Drug Use: Not on file  . Sexual Activity: Yes   Other Topics Concern  . Not on file   Social History Narrative  . No narrative on file    Family History  Problem Relation Age of Onset  . Hyperlipidemia Mother   .  Hypertension Mother   . Cancer Father     Oat cell lung CA  . Alcohol abuse Father     Outpatient Encounter Prescriptions as of 07/30/2013  Medication Sig  . albuterol (PROAIR HFA) 108 (90 BASE) MCG/ACT inhaler Inhale 2 puffs into the lungs every 6 (six) hours as needed.  Marland Kitchen amLODipine (NORVASC) 5 MG tablet TAKE 1 TABLET (5 MG TOTAL) BY MOUTH DAILY.  Marland Kitchen aspirin 81 MG tablet Take 81 mg by mouth daily.    . budesonide-formoterol (SYMBICORT) 160-4.5 MCG/ACT inhaler Inhale 2 puffs into the lungs 2 (two) times daily.  . citalopram (CELEXA) 40 MG tablet TAKE 1 TABLET BY MOUTH EVERY DAY  . diclofenac (VOLTAREN) 50 MG EC tablet Take 50 mg by mouth 2 (two) times daily.  Marland Kitchen gabapentin (NEURONTIN) 300 MG capsule Take 1 capsule (300 mg total) by mouth 2 (two) times daily.  Marland Kitchen HYDROcodone-acetaminophen (NORCO/VICODIN) 5-325 MG per tablet Take 1-2 tablets by mouth every 6 (six) hours as needed for pain.  Marland Kitchen lidocaine (XYLOCAINE) 2 % jelly Apply topically 3 (three) times daily as needed.  Marland Kitchen  losartan (COZAAR) 25 MG tablet TAKE 1 TABLET (25 MG TOTAL) BY MOUTH DAILY.  . metoprolol succinate (TOPROL-XL) 100 MG 24 hr tablet TAKE 1 TABLET (100 MG TOTAL) BY MOUTH DAILY. TAKE WITH OR IMMEDIATELY FOLLOWING A MEAL.  Marland Kitchen NEXIUM 40 MG capsule TAKE 1 CAPSULE (40 MG TOTAL) BY MOUTH DAILY.  Marland Kitchen tiotropium (SPIRIVA HANDIHALER) 18 MCG inhalation capsule Place 1 capsule (18 mcg total) into inhaler and inhale daily.  Marland Kitchen tiZANidine (ZANAFLEX) 4 MG capsule Take 4 mg by mouth at bedtime.  Marland Kitchen levofloxacin (LEVAQUIN) 500 MG tablet Take 1 tablet (500 mg total) by mouth daily.  . predniSONE (DELTASONE) 20 MG tablet Take 2 tablets (40 mg total) by mouth daily with breakfast.  . [DISCONTINUED] HYDROcodone-homatropine (HYCODAN) 5-1.5 MG/5ML syrup Take 5 mLs by mouth at bedtime as needed for cough.  . [DISCONTINUED] predniSONE (DELTASONE) 20 MG tablet 2 tabs po QD x 5 days then one a day x 5 days.          Objective:   Physical Exam   Constitutional: She is oriented to person, place, and time. She appears well-developed and well-nourished.  HENT:  Head: Normocephalic and atraumatic.  Right Ear: External ear normal.  Left Ear: External ear normal.  Nose: Nose normal.  Mouth/Throat: Oropharynx is clear and moist.  TMs and canals are clear.   Eyes: Conjunctivae and EOM are normal. Pupils are equal, round, and reactive to light.  Neck: Neck supple. No thyromegaly present.  Cardiovascular: Normal rate, regular rhythm and normal heart sounds.   Pulmonary/Chest: Effort normal and breath sounds normal. She has no wheezes.  Lymphadenopathy:    She has no cervical adenopathy.  Neurological: She is alert and oriented to person, place, and time.  Skin: Skin is warm and dry.  Psychiatric: She has a normal mood and affect.          Assessment & Plan:  COPD exacerbations/acute bronchitis-she did have a normal chest x-ray last time. We're giving her albuterol and Atrovent treatment here in the office. I'm also going to try changing her Spiriva to [place] for one month. Samples provided so that she can try and see if she feels like she gets more symptom relief. We'll go ahead and treat her with another round of antibiotics as well as a five-day burst of prednisone. I am concerned that this is the third exacerbation in a two-month timeframe.F.U in 1 month. Encouraged smoking cessatoin.

## 2013-07-30 NOTE — Patient Instructions (Signed)
Use the Tudorza twice a day instead of your spiriva for 1 months and see if you like it better.

## 2013-08-19 ENCOUNTER — Other Ambulatory Visit: Payer: Self-pay | Admitting: Family Medicine

## 2013-08-19 ENCOUNTER — Telehealth: Payer: Self-pay | Admitting: *Deleted

## 2013-08-19 MED ORDER — AZITHROMYCIN 250 MG PO TABS: ORAL_TABLET | ORAL | Status: DC

## 2013-08-19 NOTE — Telephone Encounter (Signed)
Ok will send over new rx but if not better by end of week then needs appt

## 2013-08-19 NOTE — Telephone Encounter (Signed)
Pt called and stated that she is having continuing issues w/bronchitis and would like to have an rx sent to her pharmacy. Will forward to Dr. Linford Arnold for advice.Loralee Pacas [place]

## 2013-08-19 NOTE — Telephone Encounter (Signed)
Pt informed. Voiced understanding and agreed.Loralee Pacas [place]

## 2013-08-26 ENCOUNTER — Other Ambulatory Visit: Payer: Self-pay | Admitting: Family Medicine

## 2013-09-04 ENCOUNTER — Ambulatory Visit: Payer: Medicare Other | Admitting: Family Medicine

## 2013-09-10 ENCOUNTER — Ambulatory Visit (INDEPENDENT_AMBULATORY_CARE_PROVIDER_SITE_OTHER): Payer: Medicare HMO | Admitting: Family Medicine

## 2013-09-10 ENCOUNTER — Encounter: Payer: Self-pay | Admitting: Family Medicine

## 2013-09-10 VITALS — BP 106/64 | HR 110 | Temp 98.0°F | Wt 189.0 lb

## 2013-09-10 DIAGNOSIS — I10 Essential (primary) hypertension: Secondary | ICD-10-CM

## 2013-09-10 DIAGNOSIS — J441 Chronic obstructive pulmonary disease with (acute) exacerbation: Secondary | ICD-10-CM

## 2013-09-10 DIAGNOSIS — J069 Acute upper respiratory infection, unspecified: Secondary | ICD-10-CM

## 2013-09-10 LAB — POC INFLUENZA A&B (BINAX/QUICKVUE)
Influenza A, POC: NEGATIVE
Influenza B, POC: NEGATIVE

## 2013-09-10 MED ORDER — AMLODIPINE BESYLATE 5 MG PO TABS: ORAL_TABLET | ORAL | Status: DC

## 2013-09-10 MED ORDER — CITALOPRAM HYDROBROMIDE 40 MG PO TABS: ORAL_TABLET | ORAL | Status: DC

## 2013-09-10 MED ORDER — PREDNISONE 20 MG PO TABS: 40.0000 mg | ORAL_TABLET | Freq: Every day | ORAL | Status: DC

## 2013-09-10 MED ORDER — ESOMEPRAZOLE MAGNESIUM 40 MG PO CPDR: DELAYED_RELEASE_CAPSULE | ORAL | Status: AC

## 2013-09-10 MED ORDER — METHYLPREDNISOLONE SODIUM SUCC 125 MG IJ SOLR
125.0000 mg | Freq: Once | INTRAMUSCULAR | Status: AC
  Administered 2013-09-10: 125 mg via INTRAMUSCULAR

## 2013-09-10 MED ORDER — BUDESONIDE-FORMOTEROL FUMARATE 160-4.5 MCG/ACT IN AERO: 2.0000 | INHALATION_SPRAY | Freq: Two times a day (BID) | RESPIRATORY_TRACT | Status: DC

## 2013-09-10 MED ORDER — METOPROLOL TARTRATE 100 MG PO TABS: 100.0000 mg | ORAL_TABLET | Freq: Two times a day (BID) | ORAL | Status: DC

## 2013-09-10 MED ORDER — DOXYCYCLINE HYCLATE 100 MG PO TABS: 100.0000 mg | ORAL_TABLET | Freq: Two times a day (BID) | ORAL | Status: DC

## 2013-09-10 MED ORDER — LOSARTAN POTASSIUM 25 MG PO TABS: ORAL_TABLET | ORAL | Status: DC

## 2013-09-10 MED ORDER — ALBUTEROL SULFATE HFA 108 (90 BASE) MCG/ACT IN AERS: 2.0000 | INHALATION_SPRAY | Freq: Four times a day (QID) | RESPIRATORY_TRACT | Status: DC | PRN

## 2013-09-10 NOTE — Progress Notes (Signed)
   Subjective:    Patient ID: Nancy Cisneros, female    DOB: 12/20/1940, 73 y.o.   MRN: [id number]  HPI COPD - She has had 3 COPD exacerbations in the last 3 months. On symbicort and spiriva and using is regular.  Using breathing tx BID, albuterol.  Needs vials for neb maching.  Says cough is moslty dry. Says did well until about 4 days ago. Started feeling fatigued, SOB. + bodyaches.  Some chills.  No sinus congestion.  + fever.  Last COPD exacerbation was 6 weeks ago.  +sick contacts at church.  Husband here with her today.   Hypertension- Pt denies chest pain, SOB, dizziness, or heart palpitations.  Taking meds as directed w/o problems.  Denies medication side effects.    Review of Systems     Objective:   Physical Exam  Constitutional: She is oriented to person, place, and time. She appears well-developed and well-nourished.  HENT:  Head: Normocephalic and atraumatic.  Right Ear: External ear normal.  Left Ear: External ear normal.  Nose: Nose normal.  Mouth/Throat: Oropharynx is clear and moist.  TMs and canals are clear.   Eyes: Conjunctivae and EOM are normal. Pupils are equal, round, and reactive to light.  Neck: Neck supple. No thyromegaly present.  Cardiovascular: Normal rate, regular rhythm and normal heart sounds.   Pulmonary/Chest: Effort normal. She has wheezes.  Diffuse wheezing and rhonchi  Lymphadenopathy:    She has no cervical adenopathy.  Neurological: She is alert and oriented to person, place, and time.  Skin: Skin is warm and dry.  Psychiatric: She has a normal mood and affect.          Assessment & Plan:  COPD exacerbation- not well controlled recently. Though she did well for the last 5-6 weeks. Today I think she is experiencing more of a viral illness with fever and bodyaches. We'll test her for the flu. In the meantime she is definitely wheezing and has diffuse rhonchi. Will treat with prednisone and oral antibiotics. Followup in 2 months.  Upper  respiratory infection-I. do feel like she has a viral illness. Recommend symptomatic care. Get some rest. Treat fever as needed. Call if getting worse or fever not resolving in the next couple of days.  Hypertension- Well controlled.  Continue current regimen.

## 2013-09-10 NOTE — Patient Instructions (Signed)

## 2013-09-11 ENCOUNTER — Other Ambulatory Visit: Payer: Self-pay | Admitting: *Deleted

## 2013-09-11 MED ORDER — IPRATROPIUM-ALBUTEROL 0.5-2.5 (3) MG/3ML IN SOLN
3.0000 mL | Freq: Four times a day (QID) | RESPIRATORY_TRACT | Status: DC
Start: 2013-09-11 — End: 2016-04-15

## 2013-10-29 ENCOUNTER — Other Ambulatory Visit: Payer: Self-pay | Admitting: Family Medicine

## 2014-05-15 ENCOUNTER — Other Ambulatory Visit: Payer: Self-pay | Admitting: Family Medicine

## 2014-05-15 DIAGNOSIS — Z139 Encounter for screening, unspecified: Secondary | ICD-10-CM

## 2014-05-19 ENCOUNTER — Telehealth: Payer: Self-pay | Admitting: Family Medicine

## 2014-05-19 ENCOUNTER — Other Ambulatory Visit: Payer: Self-pay | Admitting: Family Medicine

## 2014-05-19 DIAGNOSIS — G2581 Restless legs syndrome: Secondary | ICD-10-CM

## 2014-05-19 NOTE — Telephone Encounter (Signed)
Referral entered  

## 2014-05-19 NOTE — Telephone Encounter (Signed)
Caryl Pina with [facility] called to get a referral entered for mutual patient to see Dr.Runheimm Conan Bowens NPI# [id number] and then submit to [employer] on line. Diagnoses are 356.9- Neuropathy, Lumbar Radiculopathy-724.4, AND 333.94- Restless Leg. Faxed Auth# to [place] at their office Fax [phone]  Please copy instructions above to comments in referral so we know what to do and use when submitting to referral to [employer] on line.  Thanks

## 2014-06-19 ENCOUNTER — Ambulatory Visit: Payer: Medicare HMO

## 2014-07-09 ENCOUNTER — Other Ambulatory Visit: Payer: Self-pay | Admitting: *Deleted

## 2014-07-09 ENCOUNTER — Other Ambulatory Visit: Payer: Self-pay | Admitting: Family Medicine

## 2014-07-09 MED ORDER — METOPROLOL TARTRATE 100 MG PO TABS: ORAL_TABLET | ORAL | Status: DC

## 2014-07-16 ENCOUNTER — Telehealth: Payer: Self-pay

## 2014-07-16 MED ORDER — PREDNISONE 10 MG PO TABS: 50.0000 mg | ORAL_TABLET | Freq: Every day | ORAL | Status: DC

## 2014-07-16 NOTE — Telephone Encounter (Signed)
Patient aware and prescription sent

## 2014-07-16 NOTE — Telephone Encounter (Signed)
Nancy Cisneros complains of productive cough and some wheezing. She is using her nebulizer. Denies fever, chills or sweats. I offered her an appointment. She declined and would rather come in after Thanksgiving. She wants to know if Dr Madilyn Fireman will call her in some prednisone. Please advise.

## 2014-07-16 NOTE — Telephone Encounter (Signed)
[place] for prednisone burst 50mg  once daily for 5 days. If not improving or worsening go to UC or follow up on Monday.

## 2014-07-22 ENCOUNTER — Encounter: Payer: Self-pay | Admitting: Family Medicine

## 2014-07-22 ENCOUNTER — Ambulatory Visit (INDEPENDENT_AMBULATORY_CARE_PROVIDER_SITE_OTHER): Payer: Medicare HMO | Admitting: Family Medicine

## 2014-07-22 VITALS — BP 142/93 | HR 71 | Temp 97.9°F | Wt 191.0 lb

## 2014-07-22 DIAGNOSIS — J441 Chronic obstructive pulmonary disease with (acute) exacerbation: Secondary | ICD-10-CM

## 2014-07-22 MED ORDER — HYDROCODONE-HOMATROPINE 5-1.5 MG/5ML PO SYRP: 5.0000 mL | ORAL_SOLUTION | Freq: Every evening | ORAL | Status: DC | PRN

## 2014-07-22 MED ORDER — ALBUTEROL SULFATE HFA 108 (90 BASE) MCG/ACT IN AERS: 2.0000 | INHALATION_SPRAY | Freq: Four times a day (QID) | RESPIRATORY_TRACT | Status: DC | PRN

## 2014-07-22 MED ORDER — DOXYCYCLINE HYCLATE 100 MG PO TABS: 100.0000 mg | ORAL_TABLET | Freq: Two times a day (BID) | ORAL | Status: DC

## 2014-07-22 MED ORDER — IPRATROPIUM-ALBUTEROL 0.5-2.5 (3) MG/3ML IN SOLN
3.0000 mL | Freq: Once | RESPIRATORY_TRACT | Status: AC
  Administered 2014-07-22: 3 mL via RESPIRATORY_TRACT

## 2014-07-22 MED ORDER — METHYLPREDNISOLONE ACETATE 80 MG/ML IJ SUSP
80.0000 mg | Freq: Once | INTRAMUSCULAR | Status: AC
  Administered 2014-07-22: 80 mg via INTRAMUSCULAR

## 2014-07-22 NOTE — Patient Instructions (Signed)
Use her albuterol 2-4 puffs every 4 hours as needed for shortness of breath, chest tightness, or wheezing Make sure complete all the antibiotic. If he filter getting worse or just not improving then please let me know and we can get a chest x-ray.

## 2014-07-22 NOTE — Progress Notes (Addendum)
   Subjective:    Patient ID: Nancy Cisneros, female    DOB: 09-Nov-1940, 73 y.o.   MRN: [id number]  HPI she has been sick for about one week. She complains of productive cough, short of breath, wheezing, some mild nasal congestion. No fevers chills or sweats. Because of the thinks getting holiday weekend call her in a five-day course of prednisone. She took the last dose today but feels like she is really not any better. She's been using her Spiriva but says that her inhalers are outdated and has not been using them.  Review of Systems     Objective:   Physical Exam  Constitutional: She is oriented to person, place, and time. She appears well-developed and well-nourished.  HENT:  Head: Normocephalic and atraumatic.  Right Ear: External ear normal.  Left Ear: External ear normal.  Nose: Nose normal.  Mouth/Throat: Oropharynx is clear and moist.  TMs and canals are clear.   Eyes: Conjunctivae and EOM are normal. Pupils are equal, round, and reactive to light.  Neck: Neck supple. No thyromegaly present.  Cardiovascular: Normal rate, regular rhythm and normal heart sounds.   Pulmonary/Chest: Effort normal and breath sounds normal. She has no wheezes.  Diffuse vibratory noises in the chest.  Not much improvement after the albuterol tx.  Lymphadenopathy:    She has no cervical adenopathy.  Neurological: She is alert and oriented to person, place, and time.  Skin: Skin is warm and dry.  Psychiatric: She has a normal mood and affect.          Assessment & Plan:  COPD exacerbation, acute-given antibiotic treatment here in the office. Will send over new prescription for albuterol inhaler. Also send over antibiotic, doxycycline and will give her Depo-Medrol IM injection today.

## 2014-07-22 NOTE — Addendum Note (Signed)
Addended by: Teddy Spike on: 07/22/2014 05:27 PM   Modules accepted: Orders

## 2014-08-05 ENCOUNTER — Other Ambulatory Visit: Payer: Self-pay

## 2014-08-05 MED ORDER — CITALOPRAM HYDROBROMIDE 40 MG PO TABS: ORAL_TABLET | ORAL | Status: DC

## 2014-08-25 ENCOUNTER — Ambulatory Visit (INDEPENDENT_AMBULATORY_CARE_PROVIDER_SITE_OTHER): Payer: Commercial Managed Care - HMO | Admitting: Family Medicine

## 2014-08-25 ENCOUNTER — Encounter: Payer: Self-pay | Admitting: Family Medicine

## 2014-08-25 VITALS — BP 138/74 | HR 69 | Temp 98.2°F | Ht 65.0 in | Wt 190.0 lb

## 2014-08-25 DIAGNOSIS — J209 Acute bronchitis, unspecified: Secondary | ICD-10-CM

## 2014-08-25 DIAGNOSIS — J441 Chronic obstructive pulmonary disease with (acute) exacerbation: Secondary | ICD-10-CM

## 2014-08-25 MED ORDER — LEVOFLOXACIN 500 MG PO TABS: 500.0000 mg | ORAL_TABLET | Freq: Every day | ORAL | Status: AC

## 2014-08-25 MED ORDER — PREDNISONE 20 MG PO TABS: 40.0000 mg | ORAL_TABLET | Freq: Every day | ORAL | Status: DC

## 2014-08-25 MED ORDER — HYDROCODONE-HOMATROPINE 5-1.5 MG/5ML PO SYRP: 5.0000 mL | ORAL_SOLUTION | Freq: Every evening | ORAL | Status: DC | PRN

## 2014-08-25 NOTE — Progress Notes (Signed)
   Subjective:    Patient ID: Nancy Cisneros, female    DOB: August 27, 1940, 74 y.o.   MRN: [id number]  HPI 74 year old female with a history of COPD comes in today complaining of one week of increased cough, sputum production is minimal and shortness of breath and wheezing. She has been doing her albuterol but feels it hasn't been helping very much. She says her sputum is clear, when does get something up. Has not changed in color or significantly and quantity. No fevers chills or sweats. She feels her symptoms are very similar to what she had about 4 weeks ago. Right ear hurts.  Last breathing treatment was around 10 AM.    Review of Systems     Objective:   Physical Exam  Constitutional: She is oriented to person, place, and time. She appears well-developed and well-nourished.  HENT:  Head: Normocephalic and atraumatic.  Right Ear: External ear normal.  Left Ear: External ear normal.  Nose: Nose normal.  Mouth/Throat: Oropharynx is clear and moist.  TMs and canals are clear.   Eyes: Conjunctivae and EOM are normal. Pupils are equal, round, and reactive to light.  Neck: Neck supple. No thyromegaly present.  Cardiovascular: Normal rate, regular rhythm and normal heart sounds.   Pulmonary/Chest: Effort normal. She has no wheezes.  + diffuse coarse ronchi  Lymphadenopathy:    She has no cervical adenopathy.  Neurological: She is alert and oriented to person, place, and time.  Skin: Skin is warm and dry.  Psychiatric: She has a normal mood and affect.          Assessment & Plan:   COPD exacerbation./Acute bronchitis-will eventually with prednisone and frequent nebulized treatments. Recommend that she use her beer every 4 hours in the next few days during the daytime until she started to feel better and then can wean to every 6 hours. I did go ahead and give her prescription for antibiotic to fill if she feels like she's not getting worse, becomes more short of breath, notices that her  sputum is actually becoming more copious or changes in color. Right now she has scant amount of clear sputum. So I am just treating for mostly COPD exacerbation at this point. She can certainly fill the antibiotic, Levaquin 5 mg daily if needed.

## 2014-08-27 ENCOUNTER — Other Ambulatory Visit: Payer: Self-pay

## 2014-08-27 ENCOUNTER — Ambulatory Visit: Payer: Medicare HMO | Admitting: Family Medicine

## 2014-08-27 MED ORDER — PREDNISONE 20 MG PO TABS: 40.0000 mg | ORAL_TABLET | Freq: Every day | ORAL | Status: DC

## 2014-08-28 ENCOUNTER — Ambulatory Visit: Payer: Medicare HMO

## 2014-09-04 ENCOUNTER — Ambulatory Visit: Payer: Medicare HMO

## 2014-09-04 ENCOUNTER — Ambulatory Visit (INDEPENDENT_AMBULATORY_CARE_PROVIDER_SITE_OTHER): Payer: Medicare HMO

## 2014-09-04 DIAGNOSIS — R928 Other abnormal and inconclusive findings on diagnostic imaging of breast: Secondary | ICD-10-CM | POA: Diagnosis not present

## 2014-09-04 DIAGNOSIS — Z1231 Encounter for screening mammogram for malignant neoplasm of breast: Secondary | ICD-10-CM | POA: Diagnosis not present

## 2014-09-08 ENCOUNTER — Other Ambulatory Visit: Payer: Self-pay | Admitting: Family Medicine

## 2014-09-08 DIAGNOSIS — R928 Other abnormal and inconclusive findings on diagnostic imaging of breast: Secondary | ICD-10-CM

## 2014-09-16 ENCOUNTER — Other Ambulatory Visit: Payer: Self-pay | Admitting: Family Medicine

## 2014-09-16 ENCOUNTER — Other Ambulatory Visit: Payer: Self-pay

## 2014-09-16 DIAGNOSIS — R928 Other abnormal and inconclusive findings on diagnostic imaging of breast: Secondary | ICD-10-CM

## 2014-09-22 ENCOUNTER — Ambulatory Visit
Admission: RE | Admit: 2014-09-22 | Discharge: 2014-09-22 | Disposition: A | Discharge status: Home / Self Care | Payer: Commercial Managed Care - HMO | Source: Ambulatory Visit | Attending: Family Medicine | Admitting: Family Medicine

## 2014-09-22 DIAGNOSIS — R928 Other abnormal and inconclusive findings on diagnostic imaging of breast: Secondary | ICD-10-CM

## 2014-09-22 DIAGNOSIS — N63 Unspecified lump in breast: Secondary | ICD-10-CM | POA: Diagnosis not present

## 2014-09-23 ENCOUNTER — Other Ambulatory Visit: Payer: Self-pay | Admitting: Family Medicine

## 2014-09-23 MED ORDER — AMLODIPINE BESYLATE 5 MG PO TABS: ORAL_TABLET | ORAL | Status: DC

## 2014-09-26 ENCOUNTER — Other Ambulatory Visit: Payer: Self-pay | Admitting: *Deleted

## 2014-09-26 MED ORDER — AMLODIPINE BESYLATE 5 MG PO TABS: ORAL_TABLET | ORAL | Status: DC

## 2014-09-29 ENCOUNTER — Other Ambulatory Visit: Payer: Self-pay | Admitting: *Deleted

## 2014-09-29 DIAGNOSIS — M5431 Sciatica, right side: Secondary | ICD-10-CM | POA: Diagnosis not present

## 2014-09-29 DIAGNOSIS — E785 Hyperlipidemia, unspecified: Secondary | ICD-10-CM | POA: Diagnosis not present

## 2014-09-29 DIAGNOSIS — S336XXA Sprain of sacroiliac joint, initial encounter: Secondary | ICD-10-CM | POA: Diagnosis not present

## 2014-09-29 DIAGNOSIS — G541 Lumbosacral plexus disorders: Secondary | ICD-10-CM | POA: Diagnosis not present

## 2014-09-29 DIAGNOSIS — M545 Low back pain: Secondary | ICD-10-CM | POA: Diagnosis not present

## 2014-09-29 DIAGNOSIS — Z0000 Encounter for general adult medical examination without abnormal findings: Secondary | ICD-10-CM | POA: Diagnosis not present

## 2014-09-29 DIAGNOSIS — I10 Essential (primary) hypertension: Secondary | ICD-10-CM

## 2014-09-29 LAB — COMPLETE METABOLIC PANEL WITH GFR
ALBUMIN: 3.8 g/dL (ref 3.5–5.2)
ALK PHOS: 67 U/L (ref 39–117)
ALT: 13 U/L (ref 0–35)
AST: 12 U/L (ref 0–37)
BILIRUBIN TOTAL: 0.4 mg/dL (ref 0.2–1.2)
BUN: 11 mg/dL (ref 6–23)
CALCIUM: 9.4 mg/dL (ref 8.4–10.5)
CO2: 28 meq/L (ref 19–32)
CREATININE: 0.96 mg/dL (ref 0.50–1.10)
Chloride: 102 mEq/L (ref 96–112)
GFR, EST AFRICAN AMERICAN: 67 mL/min
GFR, Est Non African American: 58 mL/min — ABNORMAL LOW
Glucose, Bld: 92 mg/dL (ref 70–99)
Potassium: 4.4 mEq/L (ref 3.5–5.3)
Sodium: 138 mEq/L (ref 135–145)
TOTAL PROTEIN: 6.1 g/dL (ref 6.0–8.3)

## 2014-09-29 LAB — LIPID PANEL
CHOLESTEROL: 178 mg/dL (ref 0–200)
HDL: 44 mg/dL (ref >39)
LDL CALC: 97 mg/dL (ref 0–99)
Total CHOL/HDL Ratio: 4 Ratio
Triglycerides: 186 mg/dL — ABNORMAL HIGH (ref <150)
VLDL: 37 mg/dL (ref 0–40)

## 2014-09-29 LAB — TSH: TSH: 3.309 u[IU]/mL (ref 0.350–4.500)

## 2014-09-30 DIAGNOSIS — M1611 Unilateral primary osteoarthritis, right hip: Secondary | ICD-10-CM | POA: Diagnosis not present

## 2014-09-30 DIAGNOSIS — M79604 Pain in right leg: Secondary | ICD-10-CM | POA: Diagnosis not present

## 2014-09-30 DIAGNOSIS — M7061 Trochanteric bursitis, right hip: Secondary | ICD-10-CM | POA: Diagnosis not present

## 2014-09-30 DIAGNOSIS — M5416 Radiculopathy, lumbar region: Secondary | ICD-10-CM | POA: Diagnosis not present

## 2014-10-02 DIAGNOSIS — S336XXA Sprain of sacroiliac joint, initial encounter: Secondary | ICD-10-CM | POA: Diagnosis not present

## 2014-10-02 DIAGNOSIS — M545 Low back pain: Secondary | ICD-10-CM | POA: Diagnosis not present

## 2014-10-02 DIAGNOSIS — M5408 Panniculitis affecting regions of neck and back, sacral and sacrococcygeal region: Secondary | ICD-10-CM | POA: Diagnosis not present

## 2014-10-02 DIAGNOSIS — M5431 Sciatica, right side: Secondary | ICD-10-CM | POA: Diagnosis not present

## 2014-10-06 DIAGNOSIS — M5126 Other intervertebral disc displacement, lumbar region: Secondary | ICD-10-CM | POA: Diagnosis not present

## 2014-10-06 DIAGNOSIS — M5431 Sciatica, right side: Secondary | ICD-10-CM | POA: Diagnosis not present

## 2014-10-06 DIAGNOSIS — M545 Low back pain: Secondary | ICD-10-CM | POA: Diagnosis not present

## 2014-10-06 DIAGNOSIS — S336XXA Sprain of sacroiliac joint, initial encounter: Secondary | ICD-10-CM | POA: Diagnosis not present

## 2014-10-06 DIAGNOSIS — S33140A Subluxation of L4/L5 lumbar vertebra, initial encounter: Secondary | ICD-10-CM | POA: Diagnosis not present

## 2014-10-06 DIAGNOSIS — M5408 Panniculitis affecting regions of neck and back, sacral and sacrococcygeal region: Secondary | ICD-10-CM | POA: Diagnosis not present

## 2014-10-08 DIAGNOSIS — S336XXA Sprain of sacroiliac joint, initial encounter: Secondary | ICD-10-CM | POA: Diagnosis not present

## 2014-10-08 DIAGNOSIS — S33140A Subluxation of L4/L5 lumbar vertebra, initial encounter: Secondary | ICD-10-CM | POA: Diagnosis not present

## 2014-10-08 DIAGNOSIS — M545 Low back pain: Secondary | ICD-10-CM | POA: Diagnosis not present

## 2014-10-13 ENCOUNTER — Encounter: Payer: Commercial Managed Care - HMO | Admitting: Family Medicine

## 2014-10-15 DIAGNOSIS — M779 Enthesopathy, unspecified: Secondary | ICD-10-CM | POA: Diagnosis not present

## 2014-10-15 DIAGNOSIS — M25461 Effusion, right knee: Secondary | ICD-10-CM | POA: Diagnosis not present

## 2014-10-15 DIAGNOSIS — R609 Edema, unspecified: Secondary | ICD-10-CM | POA: Diagnosis not present

## 2014-10-15 DIAGNOSIS — M160 Bilateral primary osteoarthritis of hip: Secondary | ICD-10-CM | POA: Diagnosis not present

## 2014-10-16 DIAGNOSIS — M47816 Spondylosis without myelopathy or radiculopathy, lumbar region: Secondary | ICD-10-CM | POA: Diagnosis not present

## 2014-10-16 DIAGNOSIS — M5126 Other intervertebral disc displacement, lumbar region: Secondary | ICD-10-CM | POA: Diagnosis not present

## 2014-10-16 DIAGNOSIS — M47817 Spondylosis without myelopathy or radiculopathy, lumbosacral region: Secondary | ICD-10-CM | POA: Diagnosis not present

## 2014-10-17 DIAGNOSIS — M5416 Radiculopathy, lumbar region: Secondary | ICD-10-CM | POA: Diagnosis not present

## 2014-10-17 DIAGNOSIS — M1611 Unilateral primary osteoarthritis, right hip: Secondary | ICD-10-CM | POA: Diagnosis not present

## 2014-10-17 DIAGNOSIS — M25551 Pain in right hip: Secondary | ICD-10-CM | POA: Diagnosis not present

## 2014-10-20 DIAGNOSIS — I10 Essential (primary) hypertension: Secondary | ICD-10-CM | POA: Diagnosis not present

## 2014-10-20 DIAGNOSIS — G9341 Metabolic encephalopathy: Secondary | ICD-10-CM | POA: Diagnosis not present

## 2014-10-20 DIAGNOSIS — Z66 Do not resuscitate: Secondary | ICD-10-CM | POA: Diagnosis not present

## 2014-10-20 DIAGNOSIS — J984 Other disorders of lung: Secondary | ICD-10-CM | POA: Diagnosis not present

## 2014-10-20 DIAGNOSIS — J168 Pneumonia due to other specified infectious organisms: Secondary | ICD-10-CM | POA: Diagnosis not present

## 2014-10-20 DIAGNOSIS — R000 Tachycardia, unspecified: Secondary | ICD-10-CM | POA: Diagnosis not present

## 2014-10-20 DIAGNOSIS — R079 Chest pain, unspecified: Secondary | ICD-10-CM | POA: Diagnosis not present

## 2014-10-20 DIAGNOSIS — J189 Pneumonia, unspecified organism: Secondary | ICD-10-CM | POA: Diagnosis not present

## 2014-10-20 DIAGNOSIS — M79604 Pain in right leg: Secondary | ICD-10-CM | POA: Diagnosis not present

## 2014-10-20 DIAGNOSIS — R0902 Hypoxemia: Secondary | ICD-10-CM | POA: Diagnosis not present

## 2014-10-20 DIAGNOSIS — R918 Other nonspecific abnormal finding of lung field: Secondary | ICD-10-CM | POA: Diagnosis not present

## 2014-10-20 DIAGNOSIS — J441 Chronic obstructive pulmonary disease with (acute) exacerbation: Secondary | ICD-10-CM | POA: Diagnosis not present

## 2014-10-20 DIAGNOSIS — J9601 Acute respiratory failure with hypoxia: Secondary | ICD-10-CM | POA: Diagnosis not present

## 2014-10-20 DIAGNOSIS — F17210 Nicotine dependence, cigarettes, uncomplicated: Secondary | ICD-10-CM | POA: Diagnosis not present

## 2014-10-27 ENCOUNTER — Encounter: Payer: Commercial Managed Care - HMO | Admitting: Family Medicine

## 2014-11-04 ENCOUNTER — Ambulatory Visit (INDEPENDENT_AMBULATORY_CARE_PROVIDER_SITE_OTHER): Payer: Commercial Managed Care - HMO

## 2014-11-04 ENCOUNTER — Ambulatory Visit (INDEPENDENT_AMBULATORY_CARE_PROVIDER_SITE_OTHER): Payer: Commercial Managed Care - HMO | Admitting: Family Medicine

## 2014-11-04 ENCOUNTER — Encounter: Payer: Self-pay | Admitting: Family Medicine

## 2014-11-04 VITALS — BP 123/68 | HR 74 | Ht 65.0 in | Wt 184.0 lb

## 2014-11-04 DIAGNOSIS — J189 Pneumonia, unspecified organism: Secondary | ICD-10-CM | POA: Diagnosis not present

## 2014-11-04 DIAGNOSIS — J410 Simple chronic bronchitis: Secondary | ICD-10-CM | POA: Diagnosis not present

## 2014-11-04 DIAGNOSIS — J441 Chronic obstructive pulmonary disease with (acute) exacerbation: Secondary | ICD-10-CM

## 2014-11-04 DIAGNOSIS — J9811 Atelectasis: Secondary | ICD-10-CM

## 2014-11-04 MED ORDER — PREDNISONE 20 MG PO TABS: 40.0000 mg | ORAL_TABLET | Freq: Every day | ORAL | Status: DC

## 2014-11-04 MED ORDER — TIOTROPIUM BROMIDE MONOHYDRATE 2.5 MCG/ACT IN AERS: 2.0000 | INHALATION_SPRAY | Freq: Every day | RESPIRATORY_TRACT | Status: DC

## 2014-11-04 NOTE — Patient Instructions (Signed)
Go ahead and start the prednisone today for the next 5 days. Okay to increase your nebulizer treatments to twice a day when she start on the Spiriva. When she gets a new Spiriva - due to puffs in the morning daily. We will call you with the x-ray results once we get those back.

## 2014-11-04 NOTE — Progress Notes (Signed)
Subjective:    Patient ID: Nancy Cisneros, female    DOB: Feb 07, 1941, 74 y.o.   MRN: [id number]  HPI Here for hospital f/u for pneumonia. She was admitted to the hospital on February 29 and discharged on March 4. Hospital length of stay was 4 days at [facility]. She was diagnosed with pneumonia of both upper lobes with COPD exacerbation. She was also diagnosed with metabolic encephalopathy. They felt this was secondary to hypoxemia, fever and her pneumonia. Blood pressure remained stable throughout the hospitalization. They did recommend a repeat chest x-ray for follow-up. Has been hydratedd. No fever since home  Feels really tired. No SOB and breathing is back to normal.  She is using her NEBS 4 times a day.    Review of Systems BP 123/68 mmHg  Pulse 74  Ht 5\' 5"  (1.651 m)  Wt 184 lb (83.462 kg)  BMI 30.62 kg/m2  SpO2 94%    Allergies  Allergen Reactions  . Codeine     REACTION: trouble breathing  . Ramipril     REACTION: cough    Past Medical History  Diagnosis Date  . Hip pain, left   . Diverticulosis of colon   . Adrenal adenoma     1 cm Left  . COPD (chronic obstructive pulmonary disease)     lung nodule- Dr Tilden Dome  . Hypertension   . Hyperlipidemia   . CAD (coronary artery disease)     Dr Prince Rome, [facility] cardiology    Past Surgical History  Procedure Laterality Date  . Lynx mid urethral sling      Dr Beverley Fiedler    History   Social History  . Marital Status: Married    Spouse Name: N/A  . Number of Children: N/A  . Years of Education: N/A   Occupational History  . Not on file.   Social History Main Topics  . Smoking status: Current Every Day Smoker -- 0.40 packs/day    Last Attempt to Quit: 07/22/2008  . Smokeless tobacco: Not on file  . Alcohol Use: No  . Drug Use: Not on file  . Sexual Activity: Yes   Other Topics Concern  . Not on file   Social History Narrative    Family History  Problem Relation Age of Onset  .  Hyperlipidemia Mother   . Hypertension Mother   . Cancer Father     Oat cell lung CA  . Alcohol abuse Father     Outpatient Encounter Prescriptions as of 11/04/2014  Medication Sig  . albuterol (PROAIR HFA) 108 (90 BASE) MCG/ACT inhaler Inhale 2-4 puffs into the lungs every 6 (six) hours as needed.  Marland Kitchen amLODipine (NORVASC) 5 MG tablet Take 1 tablet every day  . aspirin 81 MG tablet Take 81 mg by mouth daily.    . budesonide-formoterol (SYMBICORT) 160-4.5 MCG/ACT inhaler Inhale 2 puffs into the lungs 2 (two) times daily.  . citalopram (CELEXA) 40 MG tablet Take 1 tablet every day  . esomeprazole (NEXIUM) 40 MG capsule TAKE 1 CAPSULE (40 MG TOTAL) BY MOUTH DAILY.  Marland Kitchen gabapentin (NEURONTIN) 300 MG capsule Take 1 capsule (300 mg total) by mouth 2 (two) times daily.  Marland Kitchen HYDROcodone-acetaminophen (NORCO/VICODIN) 5-325 MG per tablet Take 1-2 tablets by mouth every 6 (six) hours as needed for pain.  Marland Kitchen ipratropium-albuterol (DUONEB) 0.5-2.5 (3) MG/3ML SOLN Take 3 mLs by nebulization 4 (four) times daily.  Marland Kitchen lidocaine (XYLOCAINE) 2 % jelly Apply topically 3 (three) times daily as  needed.  Marland Kitchen losartan (COZAAR) 25 MG tablet Take 1 tablet every day ( **OFFICE APPOINTMENT NEEDED FOR REFILLS**  . metoprolol (LOPRESSOR) 100 MG tablet TAKE 1 TABLET BY MOUTH TWICE DAILY **OFFICE APPOINTMENT FOR REFILLS**  . predniSONE (DELTASONE) 20 MG tablet Take 2 tablets (40 mg total) by mouth daily.  . Tiotropium Bromide Monohydrate (SPIRIVA RESPIMAT) 2.5 MCG/ACT AERS Inhale 2 puffs into the lungs daily.  Marland Kitchen tiZANidine (ZANAFLEX) 4 MG capsule Take 4 mg by mouth at bedtime.  . [DISCONTINUED] HYDROcodone-homatropine (HYCODAN) 5-1.5 MG/5ML syrup Take 5 mLs by mouth at bedtime as needed for cough.  . [DISCONTINUED] predniSONE (DELTASONE) 20 MG tablet Take 2 tablets (40 mg total) by mouth daily.  . [DISCONTINUED] tiotropium (SPIRIVA HANDIHALER) 18 MCG inhalation capsule Place 1 capsule (18 mcg total) into inhaler and inhale  daily.          Objective:   Physical Exam  Constitutional: She is oriented to person, place, and time. She appears well-developed and well-nourished.  HENT:  Head: Normocephalic and atraumatic.  Right Ear: External ear normal.  Left Ear: External ear normal.  Nose: Nose normal.  Mouth/Throat: Oropharynx is clear and moist.  TMs and canals are clear.   Eyes: Conjunctivae and EOM are normal. Pupils are equal, round, and reactive to light.  Neck: Neck supple. No thyromegaly present.  Cardiovascular: Normal rate, regular rhythm and normal heart sounds.   Pulmonary/Chest: Effort normal and breath sounds normal. She has no wheezes.  Lymphadenopathy:    She has no cervical adenopathy.  Neurological: She is alert and oriented to person, place, and time.  Skin: Skin is warm and dry.  Psychiatric: She has a normal mood and affect.          Assessment & Plan:  F/U PNA - repeat CXR today.  Still wheezing on exam.  Given 5 more days of prednisone. Will restart spiriva.  Ok to decrease nebs to BID once feeling better.  F/U in 6 weeks. Will call with CXR results.

## 2014-11-14 ENCOUNTER — Emergency Department (INDEPENDENT_AMBULATORY_CARE_PROVIDER_SITE_OTHER): Payer: Commercial Managed Care - HMO

## 2014-11-14 ENCOUNTER — Emergency Department
Admission: EM | Admit: 2014-11-14 | Discharge: 2014-11-14 | Disposition: A | Discharge status: Home / Self Care | Payer: Commercial Managed Care - HMO | Source: Home / Self Care | Attending: Emergency Medicine | Admitting: Emergency Medicine

## 2014-11-14 ENCOUNTER — Encounter: Payer: Self-pay | Admitting: Emergency Medicine

## 2014-11-14 DIAGNOSIS — R0989 Other specified symptoms and signs involving the circulatory and respiratory systems: Secondary | ICD-10-CM

## 2014-11-14 DIAGNOSIS — J209 Acute bronchitis, unspecified: Secondary | ICD-10-CM | POA: Diagnosis not present

## 2014-11-14 DIAGNOSIS — J441 Chronic obstructive pulmonary disease with (acute) exacerbation: Secondary | ICD-10-CM

## 2014-11-14 DIAGNOSIS — R062 Wheezing: Secondary | ICD-10-CM

## 2014-11-14 DIAGNOSIS — J9601 Acute respiratory failure with hypoxia: Secondary | ICD-10-CM | POA: Diagnosis not present

## 2014-11-14 DIAGNOSIS — Z8701 Personal history of pneumonia (recurrent): Secondary | ICD-10-CM | POA: Diagnosis not present

## 2014-11-14 DIAGNOSIS — J40 Bronchitis, not specified as acute or chronic: Secondary | ICD-10-CM | POA: Diagnosis not present

## 2014-11-14 DIAGNOSIS — J9811 Atelectasis: Secondary | ICD-10-CM | POA: Diagnosis not present

## 2014-11-14 MED ORDER — PREDNISONE (PAK) 10 MG PO TABS: ORAL_TABLET | ORAL | Status: DC

## 2014-11-14 MED ORDER — AZITHROMYCIN 250 MG PO TABS: ORAL_TABLET | ORAL | Status: DC

## 2014-11-14 MED ORDER — CEFTRIAXONE SODIUM 1 G IJ SOLR
1.0000 g | INTRAMUSCULAR | Status: AC
  Administered 2014-11-14: 1 g via INTRAMUSCULAR

## 2014-11-14 MED ORDER — IPRATROPIUM-ALBUTEROL 0.5-2.5 (3) MG/3ML IN SOLN
3.0000 mL | Freq: Once | RESPIRATORY_TRACT | Status: AC
  Administered 2014-11-14: 3 mL via RESPIRATORY_TRACT

## 2014-11-14 MED ORDER — METHYLPREDNISOLONE ACETATE 80 MG/ML IJ SUSP
80.0000 mg | Freq: Once | INTRAMUSCULAR | Status: AC
  Administered 2014-11-14: 80 mg via INTRAMUSCULAR

## 2014-11-14 NOTE — ED Provider Notes (Addendum)
CSN: [id number]     Arrival date & time 11/14/14  1058 History   First MD Initiated Contact with Patient 11/14/14 1124     Chief Complaint  Patient presents with  . Shortness of Breath   Daughter brings her in. HPI Pt c/o SOB since she woke this am. Daughter feels she is worsening the past 2-3 days. Complains of worsening cough and chest congestion and wheezing and shortness of breath.  She was in the hospital currently his [facility] (for 4 days, discharged 10/24/14) with bilateral upper lobe pneumonia. I reviewed Dr. Gardiner Ramus follow-up visit note of 10/25/14. Dr. Madilyn Fireman prescribed oral prednisone and nebulizer albuterol treatments decreased to twice a day and she was started on Spiriva . Has finished steroids and abx and felt better until past 2 days. She has nebulizer at home and did last nebulizer treatment at home 7:30 AM. Denies fever or chills. No nausea or vomiting or diarrhea or any new leg edema  Past Medical History  Diagnosis Date  . Hip pain, left   . Diverticulosis of colon   . Adrenal adenoma     1 cm Left  . COPD (chronic obstructive pulmonary disease)     lung nodule- Dr Tilden Dome  . Hypertension   . Hyperlipidemia   . CAD (coronary artery disease)     Dr Prince Rome, [facility] cardiology   Past Surgical History  Procedure Laterality Date  . Lynx mid urethral sling      Dr Beverley Fiedler   Family History  Problem Relation Age of Onset  . Hyperlipidemia Mother   . Hypertension Mother   . Cancer Father     Oat cell lung CA  . Alcohol abuse Father    History  Substance Use Topics  . Smoking status: Current Every Day Smoker -- 0.40 packs/day    Last Attempt to Quit: 07/22/2008  . Smokeless tobacco: Not on file  . Alcohol Use: No   OB History    No data available     Review of Systems  All other systems reviewed and are negative.   Allergies  Codeine and Ramipril  Home Medications   Prior to Admission medications   Medication Sig Start Date End  Date Taking? Authorizing Provider  albuterol (PROAIR HFA) 108 (90 BASE) MCG/ACT inhaler Inhale 2-4 puffs into the lungs every 6 (six) hours as needed. 07/22/14   Hali Marry, MD  amLODipine (NORVASC) 5 MG tablet Take 1 tablet every day 09/26/14   Hali Marry, MD  aspirin 81 MG tablet Take 81 mg by mouth daily.      Historical Provider, MD  azithromycin (ZITHROMAX Z-PAK) 250 MG tablet Take 2 tablets on day one, then 1 tablet daily on days 2 through 5 11/14/14   Jacqulyn Cane, MD  budesonide-formoterol [facility]) 160-4.5 MCG/ACT inhaler Inhale 2 puffs into the lungs 2 (two) times daily. 09/10/13   Hali Marry, MD  citalopram (CELEXA) 40 MG tablet Take 1 tablet every day 08/05/14   Hali Marry, MD  esomeprazole (NEXIUM) 40 MG capsule TAKE 1 CAPSULE (40 MG TOTAL) BY MOUTH DAILY. 09/10/13   Hali Marry, MD  gabapentin (NEURONTIN) 300 MG capsule Take 1 capsule (300 mg total) by mouth 2 (two) times daily. 02/21/13   Hali Marry, MD  ipratropium-albuterol (DUONEB) 0.5-2.5 (3) MG/3ML SOLN Take 3 mLs by nebulization 4 (four) times daily. 09/11/13   Hali Marry, MD  lidocaine (XYLOCAINE) 2 % jelly Apply topically  3 (three) times daily as needed. 02/13/13   Hali Marry, MD  losartan (COZAAR) 25 MG tablet Take 1 tablet every day ( **OFFICE APPOINTMENT NEEDED FOR REFILLS** 07/09/14   Sean Hommel, DO  metoprolol (LOPRESSOR) 100 MG tablet TAKE 1 TABLET BY MOUTH TWICE DAILY **OFFICE APPOINTMENT FOR REFILLS** 07/09/14   Sean Hommel, DO  predniSONE (STERAPRED UNI-PAK) 10 MG tablet Take as directed for 6 days.--Take 6 on day 1, 5 on day 2, 4 on day 3, then 3 tablets on day 4, then 2 tablets on day 5, then 1 on day 6. 11/14/14   Jacqulyn Cane, MD  Tiotropium Bromide Monohydrate (SPIRIVA RESPIMAT) 2.5 MCG/ACT AERS Inhale 2 puffs into the lungs daily. 11/04/14   Hali Marry, MD  tiZANidine (ZANAFLEX) 4 MG capsule Take 4 mg by mouth at bedtime.     Historical Provider, MD   BP 121/75 mmHg  Pulse 86  Temp(Src) 98.2 F (36.8 C) (Oral)  SpO2 95% Physical Exam  Constitutional: She is oriented to person, place, and time. She appears well-developed and well-nourished. No distress.  HENT:  Head: Normocephalic and atraumatic.  Right Ear: Tympanic membrane normal.  Left Ear: Tympanic membrane normal.  Nose: Nose normal.  Mouth/Throat: Oropharynx is clear and moist. No oropharyngeal exudate.  Eyes: Right eye exhibits no discharge. Left eye exhibits no discharge. No scleral icterus.  Neck: Neck supple.  Cardiovascular: Normal rate, regular rhythm and normal heart sounds.   Pulmonary/Chest: She is in respiratory distress (Mild). She has decreased breath sounds. She has wheezes (throughout). She has rhonchi. She has rales (Bibasilar crackles).  Lymphadenopathy:    She has no cervical adenopathy.  Neurological: She is alert and oriented to person, place, and time.  Skin: Skin is warm and dry.  Nursing note and vitals reviewed.  extremities without cyanosis clubbing or edema  ED Course  Procedures (including critical care time) Labs Review Labs Reviewed - No data to display Initially, On room air, O2 saturation 90%. Started oxygen via nasal cannula at 2 L, O2 sat rechecked 95% DuoNeb nebulizer treatment ordered Chest x-ray ordered Imaging Review Dg Chest 2 View  11/14/2014   CLINICAL DATA:  Diagnosed with pneumonia 4 weeks ago, still wheezing and coughing, history smoking, COPD, hypertension, coronary artery disease  EXAM: CHEST  2 VIEW  COMPARISON:  11/04/2014  FINDINGS: Upper normal heart size.  Normal mediastinal contours and pulmonary vascularity.  Atherosclerotic calcification aorta.  Bronchitic changes with minimal subsegmental atelectasis at LEFT base.  Improved RIGHT basilar subsegmental atelectasis.  No acute infiltrate, pleural effusion or pneumothorax.  Bones demineralized with scattered endplate spur formation thoracic spine.   IMPRESSION: Bronchitic changes with minimal subsegmental atelectasis at LEFT base.   Electronically Signed   By: Lavonia Dana M.D.   On: 11/14/2014 12:28     MDM   1. Acute bronchitis with bronchospasm   2. Chest congestion   3. Wheezing   4. History of pneumonia   5. Acute exacerbation of chronic obstructive pulmonary disease (COPD)   6. Acute respiratory failure with hypoxia     DuoNeb nebulizer treatment given. Wheezing improved. Chest x-ray shows no infiltrates. There are bronchitic changes with minimal submental atelectasis left base. After treatment options discussed, daughter and husband and patient agree with the following plans: Depo-Medrol 80 mg IM Rocephin 1 g IM New Prescriptions   AZITHROMYCIN (ZITHROMAX Z-PAK) 250 MG TABLET    Take 2 tablets on day one, then 1 tablet daily on days 2 through  5   PREDNISONE (STERAPRED UNI-PAK) 10 MG TABLET    Take as directed for 6 days.--Take 6 on day 1, 5 on day 2, 4 on day 3, then 3 tablets on day 4, then 2 tablets on day 5, then 1 on day 6.  Continue home neb tx's Lungs improved with only mild late expiratory wheezes and mild scattered rhonchi. Much improved air movement. O2 saturation improved on room air 94 percent Follow-up with your primary care doctor in 3 days, or sooner if symptoms become worse. Precautions discussed. Red flags discussed.--Emergency room if worse or any red flags Questions invited and answered. Patient, daughter, and husband voiced understanding and agreement. Over 45 minutes spent, greater than 50% of the time spent for counseling and coordination of care.   Jacqulyn Cane, MD 11/18/14 [id number]  Jacqulyn Cane, MD 11/18/14 [phone]  Sunday 11/22/14 addendum: Phone call here to urgent care. Patient having symptoms of thrush. I researched treatment. She has had normal LFTs this year. Given her medications and age, I adjusted to a lower dose of Diflucan.  I eRX'd Diflucan 100 mg daily 10 days. --Sent to her pharmacy,  [facility].  Jacqulyn Cane, MD 11/30/14 [phone]

## 2014-11-14 NOTE — ED Notes (Signed)
Pt c/o SOB since she woke this am. She was in the hospital with double pneumonia x1 week ago. Has finished steroids and abx and felt better until today. She has nebulizer at home and did tx this am.

## 2014-11-19 ENCOUNTER — Other Ambulatory Visit: Payer: Self-pay | Admitting: Family Medicine

## 2014-11-19 ENCOUNTER — Ambulatory Visit (INDEPENDENT_AMBULATORY_CARE_PROVIDER_SITE_OTHER): Payer: Commercial Managed Care - HMO | Admitting: Family Medicine

## 2014-11-19 ENCOUNTER — Encounter: Payer: Self-pay | Admitting: Family Medicine

## 2014-11-19 VITALS — BP 132/83 | HR 76 | Ht 65.0 in | Wt 180.0 lb

## 2014-11-19 DIAGNOSIS — J410 Simple chronic bronchitis: Secondary | ICD-10-CM

## 2014-11-19 MED ORDER — ALBUTEROL SULFATE (2.5 MG/3ML) 0.083% IN NEBU
2.5000 mg | INHALATION_SOLUTION | Freq: Once | RESPIRATORY_TRACT | Status: AC
  Administered 2014-11-19: 2.5 mg via RESPIRATORY_TRACT

## 2014-11-19 MED ORDER — HYDROCODONE-HOMATROPINE 5-1.5 MG/5ML PO SYRP: 5.0000 mL | ORAL_SOLUTION | Freq: Every evening | ORAL | Status: DC | PRN

## 2014-11-19 MED ORDER — PREDNISONE 20 MG PO TABS: 40.0000 mg | ORAL_TABLET | Freq: Every day | ORAL | Status: DC

## 2014-11-19 NOTE — Patient Instructions (Signed)
Increased nebulizer treatments to every 2-3 hours for today and tomorrow. Do not need to wake up at night to do these. If improving after that can space the nebulizer treatments every 3-4 hours during the day time and then gradually space as feeling better. I sent over prescription for 5 more days of prednisone. Follow up in 2 weeks to recheck your lungs.

## 2014-11-19 NOTE — Progress Notes (Signed)
   Subjective:    Patient ID: Nancy Cisneros, female    DOB: May 02, 1941, 74 y.o.   MRN: [id number]  HPI   She was seen @ UC on 3/25 was given z-pack and prednisone for acute bronchitis, they gave her 1G of Rocephin and 80 mg of depo medrol in the office. she took spiriva about 40 mins before coming in today. She did have a chest x-ray on 325 shunt bronchitic change with minimal subsegmental atelectasis at the left base.  She  Is really fatigued. Though feels about 25% better.  Cough is more productive.  Milky color to it.Using mucinex.  Tessalon perles not working.    Review of Systems     Objective:   Physical Exam  Constitutional: She is oriented to person, place, and time. She appears well-developed and well-nourished.  HENT:  Head: Normocephalic and atraumatic.  Right Ear: External ear normal.  Left Ear: External ear normal.  Nose: Nose normal.  Mouth/Throat: Oropharynx is clear and moist.  TMs and canals are clear.   Eyes: Conjunctivae and EOM are normal. Pupils are equal, round, and reactive to light.  Neck: Neck supple. No thyromegaly present.  Cardiovascular: Normal rate, regular rhythm and normal heart sounds.   Pulmonary/Chest: Effort normal and breath sounds normal. She has no wheezes.  Diffuse rhonchi bilaterally  Lymphadenopathy:    She has no cervical adenopathy.  Neurological: She is alert and oriented to person, place, and time.  Skin: Skin is warm and dry.  Psychiatric: She has a normal mood and affect.          Assessment & Plan:  Acute COPD exacerbation/acute bronchitis-only minimal need better after 5 days. Make sure to complete the antibiotic which she will finish tomorrow. I'm going to extend her prednisone for 5 more days. I want her to be very aggressive about her albuterol treatments. She was given 2 nebs here in the office and I want her to repeat her nebs every 2-3 hours for today and tomorrow. After that if she's improving then can space it every 3-4  hours and gradually decrease over the next few days. I would like to see her back in 2 weeks to recheck lungs. Call if feels like she's getting worse, more short of breath, or develops a fever or chills. Cough syrup prescription given  Encourage smoking cessation.

## 2014-11-30 ENCOUNTER — Telehealth: Payer: Self-pay | Admitting: *Deleted

## 2014-11-30 MED ORDER — FLUCONAZOLE 100 MG PO TABS: ORAL_TABLET | ORAL | Status: DC

## 2014-12-03 DIAGNOSIS — B0229 Other postherpetic nervous system involvement: Secondary | ICD-10-CM | POA: Diagnosis not present

## 2014-12-03 DIAGNOSIS — G2581 Restless legs syndrome: Secondary | ICD-10-CM | POA: Diagnosis not present

## 2014-12-03 DIAGNOSIS — G603 Idiopathic progressive neuropathy: Secondary | ICD-10-CM | POA: Diagnosis not present

## 2014-12-03 DIAGNOSIS — M5417 Radiculopathy, lumbosacral region: Secondary | ICD-10-CM | POA: Diagnosis not present

## 2014-12-05 ENCOUNTER — Encounter: Payer: Self-pay | Admitting: Family Medicine

## 2014-12-05 ENCOUNTER — Ambulatory Visit (INDEPENDENT_AMBULATORY_CARE_PROVIDER_SITE_OTHER): Payer: Commercial Managed Care - HMO | Admitting: Family Medicine

## 2014-12-05 VITALS — BP 117/73 | HR 74 | Ht 65.0 in | Wt 189.0 lb

## 2014-12-05 DIAGNOSIS — M25551 Pain in right hip: Secondary | ICD-10-CM | POA: Diagnosis not present

## 2014-12-05 DIAGNOSIS — J441 Chronic obstructive pulmonary disease with (acute) exacerbation: Secondary | ICD-10-CM | POA: Diagnosis not present

## 2014-12-05 DIAGNOSIS — B370 Candidal stomatitis: Secondary | ICD-10-CM | POA: Diagnosis not present

## 2014-12-05 NOTE — Progress Notes (Signed)
   Subjective:    Patient ID: Nancy Cisneros, female    DOB: [birth year], 74 y.o.   MRN: [id number]  HPI Follow-up acute COPD exacerbation/acute bronchitis-she completed her antibiotics. We had to extend her prednisone 5 days while saw her last time because she was still struggling with symptoms. Overall she is feeling much better. Unfortunately she did start to get some thrush and had to call the on-call service over the weekend. She was started on fluconazole and feels like the rash is clearing up nicely.   Needs referral for sport medicine for her right hip. Saw Dr. Jaynee Eagles in February. Is supposed to f/u soon.  He dx her with bursitis.   Review of Systems     Objective:   Physical Exam  Constitutional: She is oriented to person, place, and time. She appears well-developed and well-nourished.  HENT:  Head: Normocephalic and atraumatic.  Cardiovascular: Normal rate, regular rhythm and normal heart sounds.   Pulmonary/Chest: Effort normal and breath sounds normal.  Neurological: She is alert and oriented to person, place, and time.  Skin: Skin is warm and dry.  Psychiatric: She has a normal mood and affect. Her behavior is normal.          Assessment & Plan:  Bronchitis/COPD-significantly improved. She is 80% improved. Continue with regular medications. Her latest her back in 3 months.  Right hip pain - referral placed.    Nancy Cisneros - looks good. Complete regimen.

## 2014-12-11 ENCOUNTER — Ambulatory Visit: Payer: Commercial Managed Care - HMO | Admitting: Family Medicine

## 2014-12-16 ENCOUNTER — Ambulatory Visit: Payer: Commercial Managed Care - HMO | Admitting: Family Medicine

## 2015-01-21 ENCOUNTER — Ambulatory Visit (INDEPENDENT_AMBULATORY_CARE_PROVIDER_SITE_OTHER): Payer: Commercial Managed Care - HMO | Admitting: Family Medicine

## 2015-01-21 ENCOUNTER — Encounter: Payer: Self-pay | Admitting: Family Medicine

## 2015-01-21 VITALS — BP 106/65 | HR 73 | Wt 187.0 lb

## 2015-01-21 DIAGNOSIS — R29898 Other symptoms and signs involving the musculoskeletal system: Secondary | ICD-10-CM

## 2015-01-21 DIAGNOSIS — I25119 Atherosclerotic heart disease of native coronary artery with unspecified angina pectoris: Secondary | ICD-10-CM | POA: Diagnosis not present

## 2015-01-21 DIAGNOSIS — J441 Chronic obstructive pulmonary disease with (acute) exacerbation: Secondary | ICD-10-CM

## 2015-01-21 DIAGNOSIS — M25551 Pain in right hip: Secondary | ICD-10-CM

## 2015-01-21 DIAGNOSIS — R0789 Other chest pain: Secondary | ICD-10-CM | POA: Diagnosis not present

## 2015-01-21 DIAGNOSIS — M7989 Other specified soft tissue disorders: Secondary | ICD-10-CM

## 2015-01-21 DIAGNOSIS — R0602 Shortness of breath: Secondary | ICD-10-CM | POA: Diagnosis not present

## 2015-01-21 DIAGNOSIS — Z79899 Other long term (current) drug therapy: Secondary | ICD-10-CM | POA: Diagnosis not present

## 2015-01-21 MED ORDER — PREDNISONE 20 MG PO TABS: 40.0000 mg | ORAL_TABLET | Freq: Every day | ORAL | Status: DC

## 2015-01-21 MED ORDER — SULFAMETHOXAZOLE-TRIMETHOPRIM 800-160 MG PO TABS: 1.0000 | ORAL_TABLET | Freq: Two times a day (BID) | ORAL | Status: DC

## 2015-01-21 MED ORDER — IPRATROPIUM-ALBUTEROL 0.5-2.5 (3) MG/3ML IN SOLN
3.0000 mL | Freq: Once | RESPIRATORY_TRACT | Status: AC
  Administered 2015-01-21: 3 mL via RESPIRATORY_TRACT

## 2015-01-21 MED ORDER — METHYLPREDNISOLONE SODIUM SUCC 125 MG IJ SOLR
125.0000 mg | Freq: Once | INTRAMUSCULAR | Status: AC
  Administered 2015-01-21: 125 mg via INTRAMUSCULAR

## 2015-01-21 NOTE — Patient Instructions (Signed)
Make sure using the Symbicort and Spiriva every day. The Symbicort is twice a day.

## 2015-01-21 NOTE — Progress Notes (Signed)
   Subjective:    Patient ID: Nancy Cisneros, female    DOB: [birth year], 74 y.o.   MRN: [id number]  HPI Bilateral leg swelling x 1 weeks. Started while she was at [employer]. Initially the thought she may have had a bug bite that caused some pain and swelling. She never saw a bump or wound or lesion. She is up 9 lbs in 6 weeks.  Started in the left leg and now on the right.  Its red and painful.  No chest pain or SOB. Some wheezing today. No fever, chills or sweats. No swelling in her hands. No selling in the abdomen. She denies any new soaps, lotions, perfumes or sunscreens.  In Jan was supposed to get injection in her hip.  Needs to get back in Dr. Laurance Flatten, her ortho.    Hx of CAD - having occ chest pain 1-2 times a month. Will occ get intense pressure in center of chest. No triggers. Can happen at rest.  Says drinking cold water gets it to go away.   Has concerns about her memory as well.     Review of Systems     Objective:   Physical Exam  Constitutional: She is oriented to person, place, and time. She appears well-developed and well-nourished.  HENT:  Head: Normocephalic and atraumatic.  Right Ear: External ear normal.  Left Ear: External ear normal.  Nose: Nose normal.  Mouth/Throat: Oropharynx is clear and moist.  TMs and canals are clear.   Eyes: Conjunctivae and EOM are normal. Pupils are equal, round, and reactive to light.  Neck: Neck supple. No thyromegaly present.  Cardiovascular: Normal rate, regular rhythm and normal heart sounds.   Pulmonary/Chest: Effort normal and breath sounds normal. She has no wheezes.  Diffuse rhonchi  Musculoskeletal:  Trace pitting edema of the ankles and legs bilaterally.  Some scattered erythema on the lower legs to just below the knees.   Lymphadenopathy:    She has no cervical adenopathy.  Neurological: She is alert and oriented to person, place, and time.  Skin: Skin is warm and dry.  Psychiatric: She has a normal mood and affect.           Assessment & Plan:  Bilateral leg swelling - unclear etiology. We'll by weight for abnormal liver and kidney function. Check thyroid. Evaluate for anemia. We'll check a B in [place] she has been having some atypical chest pain. Can go ahead and place her on Bactrim for the COPD exacerbation but will help with any early cellulitis as well that might be causing the redness and swelling.  Atypical chest pain-encouraged her to get back in with Dr. Prince Rome. Her last catheterization was in 2007 and she had a partial blockage at that time. Will place referral.   COPD exacerbation - given albuterol neb treatment here in the office. Given Cytomel driving for 5 mg IM. Sent a prescription for Bactrim and 5 days of prednisone.  Recommend make appt with her ortho for her hip pain  Recommend make separate appt for memory evaluation

## 2015-01-22 LAB — CBC WITH DIFFERENTIAL/PLATELET
BASOS PCT: 0 % (ref 0–1)
Basophils Absolute: 0 10*3/uL (ref 0.0–0.1)
Eosinophils Absolute: 0.1 10*3/uL (ref 0.0–0.7)
Eosinophils Relative: 1 % (ref 0–5)
HCT: 38.5 % (ref 36.0–46.0)
Hemoglobin: 12.7 g/dL (ref 12.0–15.0)
LYMPHS ABS: 1.6 10*3/uL (ref 0.7–4.0)
LYMPHS PCT: 21 % (ref 12–46)
MCH: 30.5 pg (ref 26.0–34.0)
MCHC: 33 g/dL (ref 30.0–36.0)
MCV: 92.3 fL (ref 78.0–100.0)
MPV: 11.6 fL (ref 8.6–12.4)
Monocytes Absolute: 0.5 10*3/uL (ref 0.1–1.0)
Monocytes Relative: 6 % (ref 3–12)
NEUTROS ABS: 5.5 10*3/uL (ref 1.7–7.7)
Neutrophils Relative %: 72 % (ref 43–77)
Platelets: 274 10*3/uL (ref 150–400)
RBC: 4.17 MIL/uL (ref 3.87–5.11)
RDW: 14.5 % (ref 11.5–15.5)
WBC: 7.7 10*3/uL (ref 4.0–10.5)

## 2015-01-22 LAB — COMPLETE METABOLIC PANEL WITH GFR
ALK PHOS: 56 U/L (ref 39–117)
ALT: 13 U/L (ref 0–35)
AST: 16 U/L (ref 0–37)
Albumin: 3.9 g/dL (ref 3.5–5.2)
BUN: 11 mg/dL (ref 6–23)
CALCIUM: 9.4 mg/dL (ref 8.4–10.5)
CHLORIDE: 104 meq/L (ref 96–112)
CO2: 26 meq/L (ref 19–32)
CREATININE: 0.87 mg/dL (ref 0.50–1.10)
GFR, EST NON AFRICAN AMERICAN: 65 mL/min
GFR, Est African American: 75 mL/min
Glucose, Bld: 94 mg/dL (ref 70–99)
Potassium: 4.6 mEq/L (ref 3.5–5.3)
Sodium: 139 mEq/L (ref 135–145)
Total Bilirubin: 0.5 mg/dL (ref 0.2–1.2)
Total Protein: 6 g/dL (ref 6.0–8.3)

## 2015-01-22 LAB — SEDIMENTATION RATE: SED RATE: 10 mm/h (ref 0–30)

## 2015-01-22 LAB — TSH: TSH: 1.192 u[IU]/mL (ref 0.350–4.500)

## 2015-01-22 LAB — BRAIN NATRIURETIC PEPTIDE: BRAIN NATRIURETIC PEPTIDE: 65 pg/mL (ref 0.0–100.0)

## 2015-01-23 ENCOUNTER — Ambulatory Visit: Payer: Commercial Managed Care - HMO | Admitting: Family Medicine

## 2015-01-27 ENCOUNTER — Telehealth: Payer: Self-pay | Admitting: *Deleted

## 2015-01-27 DIAGNOSIS — J441 Chronic obstructive pulmonary disease with (acute) exacerbation: Secondary | ICD-10-CM

## 2015-01-27 NOTE — Telephone Encounter (Signed)
Pt's daughter called asking that a referral be sent to the pulmonologist. She doesn't feel that her mother is getting any better. Referral placed.Audelia Hives [place]

## 2015-01-29 DIAGNOSIS — R2243 Localized swelling, mass and lump, lower limb, bilateral: Secondary | ICD-10-CM | POA: Diagnosis not present

## 2015-02-05 ENCOUNTER — Ambulatory Visit (INDEPENDENT_AMBULATORY_CARE_PROVIDER_SITE_OTHER): Payer: Commercial Managed Care - HMO | Admitting: Family Medicine

## 2015-02-05 ENCOUNTER — Encounter: Payer: Self-pay | Admitting: Family Medicine

## 2015-02-05 VITALS — BP 134/74 | HR 62 | Ht 65.0 in | Wt 183.0 lb

## 2015-02-05 DIAGNOSIS — R413 Other amnesia: Secondary | ICD-10-CM

## 2015-02-05 DIAGNOSIS — Z87891 Personal history of nicotine dependence: Secondary | ICD-10-CM | POA: Diagnosis not present

## 2015-02-05 DIAGNOSIS — F411 Generalized anxiety disorder: Secondary | ICD-10-CM | POA: Diagnosis not present

## 2015-02-05 DIAGNOSIS — F329 Major depressive disorder, single episode, unspecified: Secondary | ICD-10-CM | POA: Diagnosis not present

## 2015-02-05 DIAGNOSIS — F32A Depression, unspecified: Secondary | ICD-10-CM

## 2015-02-05 DIAGNOSIS — J441 Chronic obstructive pulmonary disease with (acute) exacerbation: Secondary | ICD-10-CM | POA: Diagnosis not present

## 2015-02-05 MED ORDER — BUPROPION HCL ER (XL) 150 MG PO TB24: 150.0000 mg | ORAL_TABLET | ORAL | Status: DC

## 2015-02-05 NOTE — Progress Notes (Signed)
Subjective:    Patient ID: Nancy Cisneros, female    DOB: 06-30-1941, 74 y.o.   MRN: [id number]  HPI Here today to evaluate her memory.  Occ forets names.  Has been forgetting what she was going to look for. Has forgetten directions to get somewhere. Still balances her check book.  No family history of alzhemers. Has felt more down.  Has been tearful.  Hasn't felt like doing a lot the last week. She is personally not as concerned as her husband and her daughter who are here with her today.  She does complain of little interest or pleasure doing things several days of the week and feeling down and depressed more than half. She also has difficulty with sleep and lack of appetite that [place] daily. She also feels bad about herself like she is low at her family down more than half the time. And complains of difficulty concentrating. She reports feeling nervous and on edge more than half the days and having difficulty controlling her worry. She says she constantly feels restless and is hard to sit still. She also complains about some irritability which her husband agrees with.   Review of Systems  BP 134/74 mmHg  Pulse 62  Ht 5\' 5"  (1.651 m)  Wt 183 lb (83.008 kg)  BMI 30.45 kg/m2    Allergies  Allergen Reactions  . Codeine     REACTION: trouble breathing  . Ramipril     REACTION: cough    Past Medical History  Diagnosis Date  . Hip pain, left   . Diverticulosis of colon   . Adrenal adenoma     1 cm Left  . COPD (chronic obstructive pulmonary disease)     lung nodule- Dr Tilden Dome  . Hypertension   . Hyperlipidemia   . CAD (coronary artery disease)     Dr Prince Rome, [facility] cardiology    Past Surgical History  Procedure Laterality Date  . Lynx mid urethral sling      Dr Beverley Fiedler    History   Social History  . Marital Status: Married    Spouse Name: N/A  . Number of Children: N/A  . Years of Education: N/A   Occupational History  . Not on file.   Social History Main  Topics  . Smoking status: Current Every Day Smoker -- 0.40 packs/day    Last Attempt to Quit: 07/22/2008  . Smokeless tobacco: Not on file  . Alcohol Use: No  . Drug Use: Not on file  . Sexual Activity: Yes   Other Topics Concern  . Not on file   Social History Narrative    Family History  Problem Relation Age of Onset  . Hyperlipidemia Mother   . Hypertension Mother   . Cancer Father     Oat cell lung CA  . Alcohol abuse Father     Outpatient Encounter Prescriptions as of 02/05/2015  Medication Sig  . albuterol (PROAIR HFA) 108 (90 BASE) MCG/ACT inhaler Inhale 2-4 puffs into the lungs every 6 (six) hours as needed.  Marland Kitchen amitriptyline (ELAVIL) 25 MG tablet   . amLODipine (NORVASC) 5 MG tablet Take 1 tablet every day  . aspirin 81 MG tablet Take 81 mg by mouth daily.    . citalopram (CELEXA) 40 MG tablet Take 1 tablet every day  . esomeprazole (NEXIUM) 40 MG capsule TAKE 1 CAPSULE (40 MG TOTAL) BY MOUTH DAILY.  Marland Kitchen gabapentin (NEURONTIN) 300 MG capsule Take 1 capsule (300 mg  total) by mouth 2 (two) times daily.  Marland Kitchen HYDROcodone-acetaminophen (NORCO) 10-325 MG per tablet   . ipratropium (ATROVENT) 0.02 % nebulizer solution TAKE 2.5 MLS (0.5 MG TOTAL) BY NEBULIZATION 4 (FOUR) TIMES DAILY.  Marland Kitchen ipratropium-albuterol (DUONEB) 0.5-2.5 (3) MG/3ML SOLN Take 3 mLs by nebulization 4 (four) times daily.  Marland Kitchen lidocaine (XYLOCAINE) 2 % jelly Apply topically 3 (three) times daily as needed.  Marland Kitchen losartan (COZAAR) 25 MG tablet Take 1 tablet every day ( **OFFICE APPOINTMENT NEEDED FOR REFILLS**  . metoprolol (LOPRESSOR) 100 MG tablet TAKE 1 TABLET BY MOUTH TWICE DAILY **OFFICE APPOINTMENT FOR REFILLS**  . rOPINIRole (REQUIP) 0.5 MG tablet   . tiZANidine (ZANAFLEX) 4 MG capsule Take 4 mg by mouth at bedtime.  . [DISCONTINUED] budesonide-formoterol (SYMBICORT) 160-4.5 MCG/ACT inhaler Inhale 2 puffs into the lungs 2 (two) times daily.  . [DISCONTINUED] predniSONE (DELTASONE) 20 MG tablet Take 2 tablets  (40 mg total) by mouth daily.  . [DISCONTINUED] sulfamethoxazole-trimethoprim (BACTRIM DS,SEPTRA DS) 800-160 MG per tablet Take 1 tablet by mouth 2 (two) times daily.  . [DISCONTINUED] Tiotropium Bromide Monohydrate (SPIRIVA RESPIMAT) 2.5 MCG/ACT AERS Inhale 2 puffs into the lungs daily.  Marland Kitchen buPROPion (WELLBUTRIN XL) 150 MG 24 hr tablet Take 1 tablet (150 mg total) by mouth every morning.   No facility-administered encounter medications on file as of 02/05/2015.          Objective:   Physical Exam  Constitutional: She is oriented to person, place, and time. She appears well-developed and well-nourished.  HENT:  Head: Normocephalic and atraumatic.  Eyes: Conjunctivae and EOM are normal.  Cardiovascular: Normal rate.   Pulmonary/Chest: Effort normal.  Neurological: She is alert and oriented to person, place, and time.  Skin: Skin is dry. No pallor.  Psychiatric: She has a normal mood and affect. Her behavior is normal.          Assessment & Plan:  Memory loss-after much discussion and reviewing her normal Mini-Mental status exam I really think that her issues are mood related. I had her go back and complete a PHQ 9 and Gad 7 for me. Her PHQ 9 score was 17 and her gad 7 score was 18 area did symptoms were consistent with severe depression and anxiety. She rated her symptoms is somewhat difficult. She denied thoughts of wanting to harm herself. We discussed treatment options with regular exercise, possible medication, and possible therapy/counseling. She is actually starting pulmonary rehabilitation in the next few weeks which is fantastic. She declined to do therapy/rehabilitation. She was open to medication. We discussed several options. She reported that she did take Wellbutrin years ago to help her quit smoking and did well with it. She said she would be open to trying that again. We'll start with Wellbutrin 150 Milligan to XL once a day. Follow-up in 3 weeks. We also discussed other  potential causes for memory concerns such as ischemic disease etc. She is a smoker which certainly puts her at risk.  Acute depression/anxiety-see note above.

## 2015-02-10 ENCOUNTER — Ambulatory Visit: Payer: Commercial Managed Care - HMO | Admitting: Family Medicine

## 2015-02-12 DIAGNOSIS — G603 Idiopathic progressive neuropathy: Secondary | ICD-10-CM | POA: Diagnosis not present

## 2015-02-12 DIAGNOSIS — M5417 Radiculopathy, lumbosacral region: Secondary | ICD-10-CM | POA: Diagnosis not present

## 2015-02-12 DIAGNOSIS — M5441 Lumbago with sciatica, right side: Secondary | ICD-10-CM | POA: Diagnosis not present

## 2015-02-12 DIAGNOSIS — M5442 Lumbago with sciatica, left side: Secondary | ICD-10-CM | POA: Diagnosis not present

## 2015-02-12 DIAGNOSIS — R202 Paresthesia of skin: Secondary | ICD-10-CM | POA: Diagnosis not present

## 2015-02-12 DIAGNOSIS — G2581 Restless legs syndrome: Secondary | ICD-10-CM | POA: Diagnosis not present

## 2015-02-16 ENCOUNTER — Other Ambulatory Visit: Payer: Self-pay

## 2015-02-25 DIAGNOSIS — I2583 Coronary atherosclerosis due to lipid rich plaque: Secondary | ICD-10-CM | POA: Diagnosis not present

## 2015-02-25 DIAGNOSIS — R079 Chest pain, unspecified: Secondary | ICD-10-CM | POA: Diagnosis not present

## 2015-02-25 DIAGNOSIS — I2510 Atherosclerotic heart disease of native coronary artery without angina pectoris: Secondary | ICD-10-CM | POA: Diagnosis not present

## 2015-02-25 DIAGNOSIS — I10 Essential (primary) hypertension: Secondary | ICD-10-CM | POA: Diagnosis not present

## 2015-02-27 ENCOUNTER — Encounter: Payer: Self-pay | Admitting: Family Medicine

## 2015-02-27 ENCOUNTER — Ambulatory Visit (INDEPENDENT_AMBULATORY_CARE_PROVIDER_SITE_OTHER): Payer: Commercial Managed Care - HMO | Admitting: Family Medicine

## 2015-02-27 VITALS — BP 119/70 | HR 58 | Ht 65.0 in | Wt 179.0 lb

## 2015-02-27 DIAGNOSIS — F32A Depression, unspecified: Secondary | ICD-10-CM

## 2015-02-27 DIAGNOSIS — F418 Other specified anxiety disorders: Secondary | ICD-10-CM

## 2015-02-27 DIAGNOSIS — F329 Major depressive disorder, single episode, unspecified: Secondary | ICD-10-CM

## 2015-02-27 DIAGNOSIS — F419 Anxiety disorder, unspecified: Principal | ICD-10-CM

## 2015-02-27 MED ORDER — SERTRALINE HCL 50 MG PO TABS: ORAL_TABLET | ORAL | Status: DC

## 2015-02-27 NOTE — Progress Notes (Signed)
   Subjective:    Patient ID: Nancy Cisneros, female    DOB: [birth year], 74 y.o.   MRN: [id number]  HPI Her today to follow-up on mood. We started her on Wellbutrin and 50 mg about 3 weeks ago. Her PHQ 9 score is 15 today and her gad 7 score is 18 today. She feels like the wellbutrin is not really helping her. Her husband is here with her today for the office visit. She feels like she is stressed and overwhelmed and that is her biggest concern.  Saw her heart doctor and had normal EKG. they are scheduling her for a stress test. She had some chest discomfort yesterday but thinks it was from her diet.  Review of Systems     Objective:   Physical Exam  Constitutional: She is oriented to person, place, and time. She appears well-developed and well-nourished.  HENT:  Head: Normocephalic and atraumatic.  Eyes: Conjunctivae and EOM are normal.  Cardiovascular: Normal rate.   Pulmonary/Chest: Effort normal.  Neurological: She is alert and oriented to person, place, and time.  Skin: Skin is dry. No pallor.  Psychiatric: She has a normal mood and affect. Her behavior is normal.          Assessment & Plan:  Anxiety with depression - discontinue wellbutrin since it really has not impacted her mood at all..  Will start sertraline. Discussed potential side effects of the medication. Start with 50mg  and f/u in 3-4 weeks.  Call if any concerns or side effects. PHQ 9 score 15 today and Gad 7 score of 18.  Atypical chest pain-still undergoing work up with cardiology.  Time spent 15 min, > 50% spent counseling about anxiety/depression/medication management.

## 2015-03-06 ENCOUNTER — Ambulatory Visit: Payer: Commercial Managed Care - HMO | Admitting: Family Medicine

## 2015-03-18 DIAGNOSIS — R079 Chest pain, unspecified: Secondary | ICD-10-CM | POA: Diagnosis not present

## 2015-03-19 DIAGNOSIS — R079 Chest pain, unspecified: Secondary | ICD-10-CM | POA: Diagnosis not present

## 2015-03-27 ENCOUNTER — Ambulatory Visit: Payer: Commercial Managed Care - HMO | Admitting: Family Medicine

## 2015-04-07 ENCOUNTER — Ambulatory Visit (INDEPENDENT_AMBULATORY_CARE_PROVIDER_SITE_OTHER): Payer: Commercial Managed Care - HMO | Admitting: Family Medicine

## 2015-04-07 ENCOUNTER — Ambulatory Visit (INDEPENDENT_AMBULATORY_CARE_PROVIDER_SITE_OTHER): Payer: Commercial Managed Care - HMO

## 2015-04-07 ENCOUNTER — Encounter: Payer: Self-pay | Admitting: Family Medicine

## 2015-04-07 VITALS — BP 104/63 | HR 61 | Ht 65.0 in | Wt 179.0 lb

## 2015-04-07 DIAGNOSIS — M25572 Pain in left ankle and joints of left foot: Secondary | ICD-10-CM

## 2015-04-07 DIAGNOSIS — F418 Other specified anxiety disorders: Secondary | ICD-10-CM | POA: Diagnosis not present

## 2015-04-07 DIAGNOSIS — Z23 Encounter for immunization: Secondary | ICD-10-CM

## 2015-04-07 DIAGNOSIS — M779 Enthesopathy, unspecified: Secondary | ICD-10-CM | POA: Diagnosis not present

## 2015-04-07 DIAGNOSIS — F419 Anxiety disorder, unspecified: Principal | ICD-10-CM

## 2015-04-07 DIAGNOSIS — F329 Major depressive disorder, single episode, unspecified: Secondary | ICD-10-CM

## 2015-04-07 MED ORDER — SERTRALINE HCL 100 MG PO TABS: 100.0000 mg | ORAL_TABLET | Freq: Every day | ORAL | Status: DC

## 2015-04-07 NOTE — Progress Notes (Signed)
   Subjective:    Patient ID: Nancy Cisneros, female    DOB: 1941-04-07, 74 y.o.   MRN: [id number]  HPI She's here today to follow-up for mood. When I last saw her she was not doing well and responding well to Wellbutrin so we decided to switch her to sertraline. She does still complain of feeling little interest and pleasure doing things more than half the days and feeling down several days a week. She also complains of feeling nervous and on edge several days of the week. She denies any thoughts of wanting to harm herself. Her gad 7 score previously was 18 and is down to 10 today. Her previous PHQ 9 score was 15 and is down to 7 today.  She denies any S.E on the medication.    Swollen on the outside ankles and painful.  Noticed pain and swelling about 10 days. Now starting to bother her at night. No ice or NSAID. Did take a pain pill for it.  No trauma or injury known..  Getting sore to walk on it.    Review of Systems     Objective:   Physical Exam  Constitutional: She is oriented to person, place, and time. She appears well-developed and well-nourished.  HENT:  Head: Normocephalic and atraumatic.  Cardiovascular: Normal rate, regular rhythm and normal heart sounds.   Pulmonary/Chest: Effort normal and breath sounds normal.  Neurological: She is alert and oriented to person, place, and time.  Skin: Skin is warm and dry.  Psychiatric: She has a normal mood and affect. Her behavior is normal.    Left ankle with normal range of motion and strength 5 out of 5 in all directions. She did have some pain with eversion of the foot against resistance. She's also tender just below the lateral malleolus area very tender to touch there. She also has some edema around the lateral ankle and a little bit of trace edema over the medial malleolus. Dorsal pedal pulses 2+. No discoloration or erythema.      Assessment & Plan:  Anxiety/depression-significant improvement in her gad 7 and PHQ 9 scores on the  sertraline. Lets try going up on the sertraline to 100mg .  F/U in 6 weeks.   Left ankle pain - Will get xrya to rule out fracture. If negative then we'll recommend anti-inflammatory rest ice and elevation with may be some mild compression with an Ace wrap. If fractured then will address at that point in time.  Given prevnar 13 and flu shot today.

## 2015-04-13 ENCOUNTER — Ambulatory Visit: Payer: Commercial Managed Care - HMO

## 2015-04-15 DIAGNOSIS — M7062 Trochanteric bursitis, left hip: Secondary | ICD-10-CM | POA: Diagnosis not present

## 2015-04-15 DIAGNOSIS — M7672 Peroneal tendinitis, left leg: Secondary | ICD-10-CM | POA: Diagnosis not present

## 2015-04-23 ENCOUNTER — Other Ambulatory Visit: Payer: Self-pay | Admitting: Family Medicine

## 2015-04-23 DIAGNOSIS — M5442 Lumbago with sciatica, left side: Secondary | ICD-10-CM | POA: Diagnosis not present

## 2015-04-23 DIAGNOSIS — G603 Idiopathic progressive neuropathy: Secondary | ICD-10-CM | POA: Diagnosis not present

## 2015-04-23 DIAGNOSIS — G2581 Restless legs syndrome: Secondary | ICD-10-CM | POA: Diagnosis not present

## 2015-04-23 DIAGNOSIS — M5441 Lumbago with sciatica, right side: Secondary | ICD-10-CM | POA: Diagnosis not present

## 2015-04-23 DIAGNOSIS — G5601 Carpal tunnel syndrome, right upper limb: Secondary | ICD-10-CM | POA: Diagnosis not present

## 2015-04-23 DIAGNOSIS — B0229 Other postherpetic nervous system involvement: Secondary | ICD-10-CM | POA: Diagnosis not present

## 2015-04-26 ENCOUNTER — Other Ambulatory Visit: Payer: Self-pay | Admitting: Family Medicine

## 2015-05-12 ENCOUNTER — Encounter: Payer: Self-pay | Admitting: Family Medicine

## 2015-05-12 ENCOUNTER — Ambulatory Visit (INDEPENDENT_AMBULATORY_CARE_PROVIDER_SITE_OTHER): Payer: Commercial Managed Care - HMO | Admitting: Family Medicine

## 2015-05-12 VITALS — BP 116/79 | HR 61 | Ht 65.0 in | Wt 173.0 lb

## 2015-05-12 DIAGNOSIS — B370 Candidal stomatitis: Secondary | ICD-10-CM | POA: Diagnosis not present

## 2015-05-12 DIAGNOSIS — F411 Generalized anxiety disorder: Secondary | ICD-10-CM | POA: Diagnosis not present

## 2015-05-12 DIAGNOSIS — M7062 Trochanteric bursitis, left hip: Secondary | ICD-10-CM | POA: Diagnosis not present

## 2015-05-12 DIAGNOSIS — J449 Chronic obstructive pulmonary disease, unspecified: Secondary | ICD-10-CM | POA: Diagnosis not present

## 2015-05-12 DIAGNOSIS — M7672 Peroneal tendinitis, left leg: Secondary | ICD-10-CM | POA: Diagnosis not present

## 2015-05-12 DIAGNOSIS — F331 Major depressive disorder, recurrent, moderate: Secondary | ICD-10-CM

## 2015-05-12 MED ORDER — NYSTATIN 100000 UNIT/ML MT SUSP
5.0000 mL | Freq: Four times a day (QID) | OROMUCOSAL | Status: DC
Start: 2015-05-12 — End: 2015-06-13

## 2015-05-12 NOTE — Addendum Note (Signed)
Addended by: Teddy Spike on: 05/12/2015 12:00 PM   Modules accepted: Orders

## 2015-05-12 NOTE — Progress Notes (Signed)
   Subjective:    Patient ID: Nancy Cisneros, female    DOB: 1941/05/06, 74 y.o.   MRN: [id number]  HPI  here today for follow-up of anxiety and depression. We recently had switched her to sertraline off of Wellbutrin. When I saw her a month ago she had hard he had some significant improvement in mood relating to her anxiety and depression. We decided to go up on the sertraline to 100 mg and then follow-up today. Says she is active but she is not exercising regularly.    Mouth is burning. Says pianful to eat. Thinks she has thrush again.  No fever, chills, etc.  She has COPD and uses inhalers.    Has appt at [facility] chest for her COPD this afternoon. She's otherwise doing well with no recent exacerbations..  Review of Systems     Objective:   Physical Exam  Constitutional: She is oriented to person, place, and time. She appears well-developed and well-nourished.  HENT:  Head: Normocephalic and atraumatic.  A few scattered white patches on roof of mouth and tongue. Denture in place.   Cardiovascular: Normal rate, regular rhythm and normal heart sounds.   Pulmonary/Chest: Effort normal and breath sounds normal.  Neurological: She is alert and oriented to person, place, and time.  Skin: Skin is warm and dry.  Psychiatric: She has a normal mood and affect. Her behavior is normal.          Assessment & Plan:   Depression/Anxiety- discussed that she is doing much better on this regimen and very happy with it.  F/U in 3 months.   Thrush -  We'll treat with nystatin swish and swallow. Did do a KOH they will call if the results are negative.  COPD-following with [facility]. Infection has an appointment this afternoon.

## 2015-05-13 LAB — KOH PREP

## 2015-06-04 DIAGNOSIS — G2581 Restless legs syndrome: Secondary | ICD-10-CM | POA: Diagnosis not present

## 2015-06-04 DIAGNOSIS — R202 Paresthesia of skin: Secondary | ICD-10-CM | POA: Diagnosis not present

## 2015-06-04 DIAGNOSIS — G5603 Carpal tunnel syndrome, bilateral upper limbs: Secondary | ICD-10-CM | POA: Diagnosis not present

## 2015-06-04 DIAGNOSIS — B0229 Other postherpetic nervous system involvement: Secondary | ICD-10-CM | POA: Diagnosis not present

## 2015-06-04 DIAGNOSIS — G603 Idiopathic progressive neuropathy: Secondary | ICD-10-CM | POA: Diagnosis not present

## 2015-06-04 DIAGNOSIS — M5412 Radiculopathy, cervical region: Secondary | ICD-10-CM | POA: Diagnosis not present

## 2015-06-04 DIAGNOSIS — M5417 Radiculopathy, lumbosacral region: Secondary | ICD-10-CM | POA: Diagnosis not present

## 2015-06-13 ENCOUNTER — Other Ambulatory Visit: Payer: Self-pay | Admitting: Family Medicine

## 2015-06-18 DIAGNOSIS — M5116 Intervertebral disc disorders with radiculopathy, lumbar region: Secondary | ICD-10-CM | POA: Diagnosis not present

## 2015-06-18 DIAGNOSIS — M47816 Spondylosis without myelopathy or radiculopathy, lumbar region: Secondary | ICD-10-CM | POA: Diagnosis not present

## 2015-06-18 DIAGNOSIS — M4726 Other spondylosis with radiculopathy, lumbar region: Secondary | ICD-10-CM | POA: Diagnosis not present

## 2015-06-18 DIAGNOSIS — M4727 Other spondylosis with radiculopathy, lumbosacral region: Secondary | ICD-10-CM | POA: Diagnosis not present

## 2015-06-18 DIAGNOSIS — M47817 Spondylosis without myelopathy or radiculopathy, lumbosacral region: Secondary | ICD-10-CM | POA: Diagnosis not present

## 2015-06-25 ENCOUNTER — Telehealth: Payer: Self-pay | Admitting: *Deleted

## 2015-06-25 DIAGNOSIS — Z90710 Acquired absence of both cervix and uterus: Secondary | ICD-10-CM | POA: Diagnosis not present

## 2015-06-25 DIAGNOSIS — Z01419 Encounter for gynecological examination (general) (routine) without abnormal findings: Secondary | ICD-10-CM | POA: Diagnosis not present

## 2015-06-25 DIAGNOSIS — M7062 Trochanteric bursitis, left hip: Secondary | ICD-10-CM

## 2015-06-25 DIAGNOSIS — Z1272 Encounter for screening for malignant neoplasm of vagina: Secondary | ICD-10-CM | POA: Diagnosis not present

## 2015-06-25 DIAGNOSIS — M7672 Peroneal tendinitis, left leg: Secondary | ICD-10-CM

## 2015-06-25 NOTE — Telephone Encounter (Signed)
Referral sent to Dr. Tawanna Sat office.Nancy Cisneros [place]

## 2015-06-30 ENCOUNTER — Telehealth: Payer: Self-pay | Admitting: *Deleted

## 2015-06-30 NOTE — Telephone Encounter (Signed)
Pt called and lvm asking about getting a referral. I called her back informed her that a referral was placed for her last week with her orthopedic doctor. She stated that she needed a referral for a DJO product that she got while she was being seen by the ortho dr. She is going to fax the letter for me to look at for reference .Nancy Cisneros [place]

## 2015-07-02 ENCOUNTER — Other Ambulatory Visit: Payer: Self-pay | Admitting: Family Medicine

## 2015-07-04 ENCOUNTER — Other Ambulatory Visit: Payer: Self-pay | Admitting: Family Medicine

## 2015-07-06 ENCOUNTER — Encounter: Payer: Self-pay | Admitting: Family Medicine

## 2015-07-06 ENCOUNTER — Ambulatory Visit (INDEPENDENT_AMBULATORY_CARE_PROVIDER_SITE_OTHER): Payer: Commercial Managed Care - HMO | Admitting: Family Medicine

## 2015-07-06 VITALS — BP 133/61 | HR 69 | Temp 98.1°F | Resp 18 | Wt 180.0 lb

## 2015-07-06 DIAGNOSIS — J441 Chronic obstructive pulmonary disease with (acute) exacerbation: Secondary | ICD-10-CM

## 2015-07-06 DIAGNOSIS — Z1231 Encounter for screening mammogram for malignant neoplasm of breast: Secondary | ICD-10-CM | POA: Diagnosis not present

## 2015-07-06 DIAGNOSIS — R52 Pain, unspecified: Secondary | ICD-10-CM

## 2015-07-06 MED ORDER — PREDNISONE 20 MG PO TABS: 40.0000 mg | ORAL_TABLET | Freq: Every day | ORAL | Status: DC

## 2015-07-06 MED ORDER — METHYLPREDNISOLONE SODIUM SUCC 125 MG IJ SOLR
125.0000 mg | Freq: Once | INTRAMUSCULAR | Status: AC
  Administered 2015-07-06: 125 mg via INTRAMUSCULAR

## 2015-07-06 MED ORDER — AZITHROMYCIN 250 MG PO TABS
ORAL_TABLET | ORAL | Status: AC
Start: 2015-07-06 — End: 2015-07-11

## 2015-07-06 NOTE — Progress Notes (Signed)
   Subjective:    Patient ID: Nancy Cisneros, female    DOB: 04-Feb-1941, 74 y.o.   MRN: [id number]  HPI 74 year old female with a history of COPD and chronic bronchitis comes in today with acute upper respiratory symptoms. She complains of Nasal Congestion Chills, fever, productive cough for 1 day. Sputum has a cloudy color. She did do a nebulizer treatment earlier today around 9 AM. She does feel like it helped. She also complains of body aches and a runny nose. She did have her flu shot in August.    Review of Systems     Objective:   Physical Exam  Constitutional: She is oriented to person, place, and time. She appears well-developed and well-nourished.  HENT:  Head: Normocephalic and atraumatic.  Right Ear: External ear normal.  Left Ear: External ear normal.  Nose: Nose normal.  Mouth/Throat: Oropharynx is clear and moist.  TMs and canals are clear.   Eyes: Conjunctivae and EOM are normal. Pupils are equal, round, and reactive to light.  Neck: Neck supple. No thyromegaly present.  Cardiovascular: Normal rate, regular rhythm and normal heart sounds.   Pulmonary/Chest: Effort normal. She has wheezes.  Diffuse wheezing and rhonchi.  Lymphadenopathy:    She has no cervical adenopathy.  Neurological: She is alert and oriented to person, place, and time.  Skin: Skin is warm and dry.  Psychiatric: She has a normal mood and affect.          Assessment & Plan:  COPD exacerbation-we'll treat with azithromycin and prednisone. She requested a steroid injection while she was here. She says it works much better for her. So given 125 mg of Solu-Medrol IM. Call if not feeling significantly better by the end of the week. Come back in 2 weeks to recheck lungs. Use nebulizer treatments liberally. Can use every 4 hours as needed. Flu test was negative.

## 2015-07-08 ENCOUNTER — Other Ambulatory Visit: Payer: Self-pay | Admitting: *Deleted

## 2015-07-08 DIAGNOSIS — M7672 Peroneal tendinitis, left leg: Secondary | ICD-10-CM | POA: Diagnosis not present

## 2015-07-09 ENCOUNTER — Other Ambulatory Visit: Payer: Self-pay | Admitting: Family Medicine

## 2015-07-09 ENCOUNTER — Other Ambulatory Visit: Payer: Self-pay

## 2015-07-09 NOTE — Telephone Encounter (Signed)
No she is on sertraline now and it is working well.  She can't be on both.  She needs to be off teh citalpram

## 2015-07-09 NOTE — Telephone Encounter (Signed)
Pt would like to know if she can take continue on Citalopram. Please advise.

## 2015-07-09 NOTE — Telephone Encounter (Signed)
Pt notified, is fine with the below message. No questions at this time.

## 2015-07-10 ENCOUNTER — Telehealth: Payer: Self-pay | Admitting: Emergency Medicine

## 2015-07-10 NOTE — Telephone Encounter (Signed)
I am not sure why they are requesting a refill on the citalopram she is now on sertraline and does not need to be on both. Please call her. It is very dangerous to take both.  I can refer he to a therapist as well.

## 2015-07-13 MED ORDER — AMOXICILLIN-POT CLAVULANATE 875-125 MG PO TABS: 1.0000 | ORAL_TABLET | Freq: Two times a day (BID) | ORAL | Status: DC

## 2015-07-13 NOTE — Telephone Encounter (Signed)
She also says she is not better with her URi. Completed zpack about 2 days ago.  Will send in Aguementin, new antiobiotic.

## 2015-07-15 DIAGNOSIS — M722 Plantar fascial fibromatosis: Secondary | ICD-10-CM | POA: Diagnosis not present

## 2015-07-15 DIAGNOSIS — M19072 Primary osteoarthritis, left ankle and foot: Secondary | ICD-10-CM | POA: Diagnosis not present

## 2015-07-15 DIAGNOSIS — M7662 Achilles tendinitis, left leg: Secondary | ICD-10-CM | POA: Diagnosis not present

## 2015-07-15 DIAGNOSIS — M779 Enthesopathy, unspecified: Secondary | ICD-10-CM | POA: Diagnosis not present

## 2015-07-20 ENCOUNTER — Ambulatory Visit: Payer: Commercial Managed Care - HMO | Admitting: Family Medicine

## 2015-07-20 ENCOUNTER — Ambulatory Visit (INDEPENDENT_AMBULATORY_CARE_PROVIDER_SITE_OTHER): Payer: Commercial Managed Care - HMO | Admitting: Family Medicine

## 2015-07-20 ENCOUNTER — Encounter: Payer: Self-pay | Admitting: Family Medicine

## 2015-07-20 VITALS — BP 109/50 | HR 70 | Temp 98.3°F | Resp 18 | Wt 178.4 lb

## 2015-07-20 DIAGNOSIS — J441 Chronic obstructive pulmonary disease with (acute) exacerbation: Secondary | ICD-10-CM | POA: Diagnosis not present

## 2015-07-20 DIAGNOSIS — J209 Acute bronchitis, unspecified: Secondary | ICD-10-CM | POA: Diagnosis not present

## 2015-07-20 MED ORDER — BUDESONIDE-FORMOTEROL FUMARATE 160-4.5 MCG/ACT IN AERO: 2.0000 | INHALATION_SPRAY | Freq: Two times a day (BID) | RESPIRATORY_TRACT | Status: DC

## 2015-07-20 NOTE — Progress Notes (Signed)
   Subjective:    Patient ID: Nancy Cisneros, female    DOB: Sep 12, 1940, 74 y.o.   MRN: [id number]  HPI COPD exacerbation with bronchitis - she completed the zpack but wasn't feeling better. We then called in Augmentin. She has been on it for a week. She feels at least 50% better.  No fever.  Still some SOB. She is suing her Symbicort.     Review of Systems     Objective:   Physical Exam  Constitutional: She is oriented to person, place, and time. She appears well-developed and well-nourished.  HENT:  Head: Normocephalic and atraumatic.  Right Ear: External ear normal.  Left Ear: External ear normal.  Nose: Nose normal.  Mouth/Throat: Oropharynx is clear and moist.  TMs and canals are clear.   Eyes: Conjunctivae and EOM are normal. Pupils are equal, round, and reactive to light.  Neck: Neck supple. No thyromegaly present.  Cardiovascular: Normal rate, regular rhythm and normal heart sounds.   Pulmonary/Chest: Effort normal and breath sounds normal. She has no wheezes.  Inspiratory rhonchi bilaterally   Lymphadenopathy:    She has no cervical adenopathy.  Neurological: She is alert and oriented to person, place, and time.  Skin: Skin is warm and dry.  Psychiatric: She has a normal mood and affect.          Assessment & Plan:  COPD exacerbation - she is at least 50% better.  Aksed her to completed her antibiotic and call back i fno t better in 1-2 weeks.  Continue the symbicort.  Can use nebs PRN.  Encouraged her to use at least BID until feeling better.

## 2015-07-21 DIAGNOSIS — S86302D Unspecified injury of muscle(s) and tendon(s) of peroneal muscle group at lower leg level, left leg, subsequent encounter: Secondary | ICD-10-CM | POA: Diagnosis not present

## 2015-07-21 DIAGNOSIS — M6688 Spontaneous rupture of other tendons, other: Secondary | ICD-10-CM | POA: Diagnosis not present

## 2015-07-28 ENCOUNTER — Ambulatory Visit (INDEPENDENT_AMBULATORY_CARE_PROVIDER_SITE_OTHER): Payer: Commercial Managed Care - HMO

## 2015-07-28 ENCOUNTER — Encounter: Payer: Self-pay | Admitting: Family Medicine

## 2015-07-28 ENCOUNTER — Ambulatory Visit (INDEPENDENT_AMBULATORY_CARE_PROVIDER_SITE_OTHER): Payer: Commercial Managed Care - HMO | Admitting: Family Medicine

## 2015-07-28 VITALS — BP 127/62 | HR 78 | Temp 98.8°F | Wt 181.6 lb

## 2015-07-28 DIAGNOSIS — R05 Cough: Secondary | ICD-10-CM | POA: Diagnosis not present

## 2015-07-28 DIAGNOSIS — J441 Chronic obstructive pulmonary disease with (acute) exacerbation: Secondary | ICD-10-CM | POA: Diagnosis not present

## 2015-07-28 DIAGNOSIS — J9811 Atelectasis: Secondary | ICD-10-CM | POA: Diagnosis not present

## 2015-07-28 DIAGNOSIS — R509 Fever, unspecified: Secondary | ICD-10-CM | POA: Diagnosis not present

## 2015-07-28 DIAGNOSIS — J189 Pneumonia, unspecified organism: Secondary | ICD-10-CM | POA: Diagnosis not present

## 2015-07-28 DIAGNOSIS — J984 Other disorders of lung: Secondary | ICD-10-CM

## 2015-07-28 DIAGNOSIS — J181 Lobar pneumonia, unspecified organism: Principal | ICD-10-CM

## 2015-07-28 DIAGNOSIS — R0602 Shortness of breath: Secondary | ICD-10-CM | POA: Diagnosis not present

## 2015-07-28 DIAGNOSIS — I517 Cardiomegaly: Secondary | ICD-10-CM | POA: Diagnosis not present

## 2015-07-28 MED ORDER — LEVOFLOXACIN 500 MG PO TABS: 500.0000 mg | ORAL_TABLET | Freq: Every day | ORAL | Status: AC

## 2015-07-28 MED ORDER — METHYLPREDNISOLONE ACETATE 80 MG/ML IJ SUSP
80.0000 mg | Freq: Once | INTRAMUSCULAR | Status: AC
  Administered 2015-07-28: 80 mg via INTRAMUSCULAR

## 2015-07-28 MED ORDER — IPRATROPIUM-ALBUTEROL 0.5-2.5 (3) MG/3ML IN SOLN
3.0000 mL | Freq: Once | RESPIRATORY_TRACT | Status: AC
  Administered 2015-07-28: 3 mL via RESPIRATORY_TRACT

## 2015-07-28 NOTE — Progress Notes (Signed)
   Subjective:    Patient ID: Nancy Cisneros, female    DOB: Jan 17, 1941, 74 y.o.   MRN: [id number]  HPI 74 year old female here for increased shortness of breath and cough. I saw her about 2 weeks ago for follow-up COPD exacerbation and she was getting better. In fact she was about 50% better from symptoms that started earlier in the month. She completed her antibiotic and prednisone. She started feeling worse a couple of days ago.  Her cough is loud and deep her her husband..  Temp 100.6 last night.  Cough is somewhat productive. He says they are mostly to the emergency department this morning.   Review of Systems     Objective:   Physical Exam  Constitutional: She is oriented to person, place, and time. She appears well-developed and well-nourished.  HENT:  Head: Normocephalic and atraumatic.  Right Ear: External ear normal.  Left Ear: External ear normal.  Nose: Nose normal.  Mouth/Throat: Oropharynx is clear and moist.  TMs and canals are clear.   Eyes: Conjunctivae and EOM are normal. Pupils are equal, round, and reactive to light.  Neck: Neck supple. No thyromegaly present.  Cardiovascular: Normal rate, regular rhythm and normal heart sounds.   Pulmonary/Chest: Effort normal and breath sounds normal. She has no wheezes.  Lymphadenopathy:    She has no cervical adenopathy.  Neurological: She is alert and oriented to person, place, and time.  Skin: Skin is warm and dry.  Psychiatric: She has a normal mood and affect.          Assessment & Plan:  Possible early pneumonia. We'll get chest x-ray today. It almost seems like she's had a double sickening. She was actually about 50% better when I saw her about a week and a half ago and now she's getting worse and around the fever last night. She does have COPD with chronic bronchitis. I did go ahead and give her any Milligan's of Depo-Medrol IM injection today. We'll call with results once chest x-ray is available. We did give her  nebulizer treatment here in the office. She declined a second tre atment. Pulse ox is at 94% but this is stable for her.  CXR shows possible early LLL pneumonia. Tx with levaquin.

## 2015-08-11 ENCOUNTER — Ambulatory Visit: Payer: Commercial Managed Care - HMO | Admitting: Family Medicine

## 2015-08-11 DIAGNOSIS — Z01818 Encounter for other preprocedural examination: Secondary | ICD-10-CM | POA: Diagnosis not present

## 2015-08-11 DIAGNOSIS — I2510 Atherosclerotic heart disease of native coronary artery without angina pectoris: Secondary | ICD-10-CM | POA: Diagnosis not present

## 2015-08-11 DIAGNOSIS — Z79899 Other long term (current) drug therapy: Secondary | ICD-10-CM | POA: Diagnosis not present

## 2015-08-11 DIAGNOSIS — Z7951 Long term (current) use of inhaled steroids: Secondary | ICD-10-CM | POA: Diagnosis not present

## 2015-08-11 DIAGNOSIS — J449 Chronic obstructive pulmonary disease, unspecified: Secondary | ICD-10-CM | POA: Diagnosis not present

## 2015-08-11 DIAGNOSIS — S86312A Strain of muscle(s) and tendon(s) of peroneal muscle group at lower leg level, left leg, initial encounter: Secondary | ICD-10-CM | POA: Diagnosis not present

## 2015-08-21 DIAGNOSIS — S8982XD Other specified injuries of left lower leg, subsequent encounter: Secondary | ICD-10-CM | POA: Diagnosis not present

## 2015-08-21 DIAGNOSIS — I2510 Atherosclerotic heart disease of native coronary artery without angina pectoris: Secondary | ICD-10-CM | POA: Diagnosis not present

## 2015-08-21 DIAGNOSIS — G8918 Other acute postprocedural pain: Secondary | ICD-10-CM | POA: Diagnosis not present

## 2015-08-21 DIAGNOSIS — S86312A Strain of muscle(s) and tendon(s) of peroneal muscle group at lower leg level, left leg, initial encounter: Secondary | ICD-10-CM | POA: Diagnosis not present

## 2015-08-21 DIAGNOSIS — M7672 Peroneal tendinitis, left leg: Secondary | ICD-10-CM | POA: Diagnosis not present

## 2015-08-21 DIAGNOSIS — K219 Gastro-esophageal reflux disease without esophagitis: Secondary | ICD-10-CM | POA: Diagnosis not present

## 2015-08-21 DIAGNOSIS — S86392A Other injury of muscle(s) and tendon(s) of peroneal muscle group at lower leg level, left leg, initial encounter: Secondary | ICD-10-CM | POA: Diagnosis not present

## 2015-08-21 DIAGNOSIS — E785 Hyperlipidemia, unspecified: Secondary | ICD-10-CM | POA: Diagnosis not present

## 2015-08-21 DIAGNOSIS — I10 Essential (primary) hypertension: Secondary | ICD-10-CM | POA: Diagnosis not present

## 2015-08-26 ENCOUNTER — Other Ambulatory Visit: Payer: Self-pay | Admitting: Family Medicine

## 2015-09-01 DIAGNOSIS — S86302D Unspecified injury of muscle(s) and tendon(s) of peroneal muscle group at lower leg level, left leg, subsequent encounter: Secondary | ICD-10-CM | POA: Diagnosis not present

## 2015-09-04 ENCOUNTER — Telehealth: Payer: Self-pay | Admitting: Family Medicine

## 2015-09-04 NOTE — Telephone Encounter (Signed)
Pt advised of recommendations, verbalized understanding.

## 2015-09-04 NOTE — Telephone Encounter (Signed)
Recommend a trial of over-the-counter MiraLAX. Recommend start with twice a day dosing until she has a soft bowel movement. Also consider taking a probiotic to help with excess gas and bloating.

## 2015-09-04 NOTE — Telephone Encounter (Signed)
Pt called to states she had surgery on 08/21/15 and she is still very "crampy" when she has to use the bathroom. Wants to know if PCP has a recommendation. Will route.

## 2015-09-09 ENCOUNTER — Ambulatory Visit (INDEPENDENT_AMBULATORY_CARE_PROVIDER_SITE_OTHER): Payer: Commercial Managed Care - HMO | Admitting: Family Medicine

## 2015-09-09 ENCOUNTER — Encounter: Payer: Self-pay | Admitting: Family Medicine

## 2015-09-09 VITALS — BP 149/70 | HR 66 | Temp 97.7°F | Wt 170.0 lb

## 2015-09-09 DIAGNOSIS — R42 Dizziness and giddiness: Secondary | ICD-10-CM

## 2015-09-09 DIAGNOSIS — H9201 Otalgia, right ear: Secondary | ICD-10-CM

## 2015-09-09 MED ORDER — PREDNISONE 20 MG PO TABS: 40.0000 mg | ORAL_TABLET | Freq: Every day | ORAL | Status: DC

## 2015-09-09 NOTE — Progress Notes (Signed)
   Subjective:    Patient ID: Nancy Cisneros, female    DOB: 1940-09-15, 75 y.o.   MRN: [id number]  HPI Right ear pain and dizziness that started last night. No recent URI sxs. No fevers, chills or fever.  Says can't hear out of that ear.  Problem swallowing or throat pain. She says the vertigo was a little bit more intense yesterday. No head injury.  Recently dx with left peroneal tendonitis ans she is in a boot today. She has to wait for a couple of more weeks.   Review of Systems     Objective:   Physical Exam  Constitutional: She is oriented to person, place, and time. She appears well-developed and well-nourished.  HENT:  Head: Normocephalic and atraumatic.  Right Ear: External ear normal.  Left Ear: External ear normal.  Nose: Nose normal.  Mouth/Throat: Oropharynx is clear and moist.  TMs and canals are clear.   Eyes: Conjunctivae and EOM are normal. Pupils are equal, round, and reactive to light.  Neck: Neck supple. No thyromegaly present.  Cardiovascular: Normal rate, regular rhythm and normal heart sounds.   Pulmonary/Chest: Effort normal and breath sounds normal. She has no wheezes.  Lymphadenopathy:    She has no cervical adenopathy.  Neurological: She is alert and oriented to person, place, and time.  Skin: Skin is warm and dry.  Psychiatric: She has a normal mood and affect.          Assessment & Plan:  Right otalgia-exam is actually fairly normal. Suspect possibly vestibulitis. We'll go ahead and treat with prednisone for 5 days. If not improving by the beginning of next week and please call us back and I'll refer her to ENT for further evaluation.

## 2015-09-17 DIAGNOSIS — S86302D Unspecified injury of muscle(s) and tendon(s) of peroneal muscle group at lower leg level, left leg, subsequent encounter: Secondary | ICD-10-CM | POA: Diagnosis not present

## 2015-09-17 DIAGNOSIS — Z9889 Other specified postprocedural states: Secondary | ICD-10-CM | POA: Diagnosis not present

## 2015-09-18 ENCOUNTER — Encounter: Payer: Self-pay | Admitting: Family Medicine

## 2015-09-18 ENCOUNTER — Ambulatory Visit (INDEPENDENT_AMBULATORY_CARE_PROVIDER_SITE_OTHER): Payer: Commercial Managed Care - HMO | Admitting: Family Medicine

## 2015-09-18 VITALS — BP 141/64 | HR 67 | Wt 176.0 lb

## 2015-09-18 DIAGNOSIS — H9201 Otalgia, right ear: Secondary | ICD-10-CM

## 2015-09-18 DIAGNOSIS — F411 Generalized anxiety disorder: Secondary | ICD-10-CM | POA: Diagnosis not present

## 2015-09-18 DIAGNOSIS — H9191 Unspecified hearing loss, right ear: Secondary | ICD-10-CM | POA: Diagnosis not present

## 2015-09-18 NOTE — Progress Notes (Signed)
   Subjective:    Patient ID: Nancy Cisneros, female    DOB: 11-06-1940, 75 y.o.   MRN: [id number]  HPI Here for four-month follow-up on anxiety-overall she is happy with her medication regimen. She does complain of feeling down several days of the week and still feels nervous and anxious and on edge nearly every day and feels she worries excessively. She denies any thoughts of wanting to harm herself. She rates her symptoms is somewhat difficult. She's currently on Zoloft 100 mg daily  She still complains that her right ear feels clogged and uncomfortable. I saw her about a week ago for right ear pain and some intermittent dizziness. She did not have any recent cold symptoms fevers or chills or drainage from the ear. I feel she could have vestibulitis on head and treat her with 5 days of prednisone. She said it really doesn't feel any better after treatment. Like it's clogged and can't hear out of it well.   Review of Systems     Objective:   Physical Exam  Constitutional: She appears well-developed and well-nourished.  HENT:  Head: Normocephalic and atraumatic.  Right Ear: External ear normal.  Left Ear: External ear normal.  Nose: Nose normal.  TMs and canals are clear bilaterally.  Cardiovascular: Normal rate, regular rhythm and normal heart sounds.   Pulmonary/Chest: Effort normal and breath sounds normal.          Assessment & Plan:  Generalized anxiety disorder-GaD 7 score of 21 today and PHQ 9 score of 10.  Not well controlled.   I asked if she wanted to adjust her medication today and she declined and said she wants to keep it the same. She feels like it's helpful and doesn't want to change her regimen right now.  Right otalgia with hearing loss. Hearing screen and tympanometry performed today. Seminoma treated with actually normal. Hearing screen was abnormal with significant hearing loss on the right. We will try get her in with ENT early next week. Referral placed.

## 2015-09-23 DIAGNOSIS — S86312D Strain of muscle(s) and tendon(s) of peroneal muscle group at lower leg level, left leg, subsequent encounter: Secondary | ICD-10-CM | POA: Diagnosis not present

## 2015-09-23 DIAGNOSIS — Z9889 Other specified postprocedural states: Secondary | ICD-10-CM | POA: Diagnosis not present

## 2015-09-24 DIAGNOSIS — M5441 Lumbago with sciatica, right side: Secondary | ICD-10-CM | POA: Diagnosis not present

## 2015-09-24 DIAGNOSIS — G2581 Restless legs syndrome: Secondary | ICD-10-CM | POA: Diagnosis not present

## 2015-09-24 DIAGNOSIS — G603 Idiopathic progressive neuropathy: Secondary | ICD-10-CM | POA: Diagnosis not present

## 2015-09-24 DIAGNOSIS — B0229 Other postherpetic nervous system involvement: Secondary | ICD-10-CM | POA: Diagnosis not present

## 2015-09-24 DIAGNOSIS — M5442 Lumbago with sciatica, left side: Secondary | ICD-10-CM | POA: Diagnosis not present

## 2015-09-24 DIAGNOSIS — M5417 Radiculopathy, lumbosacral region: Secondary | ICD-10-CM | POA: Diagnosis not present

## 2015-09-24 DIAGNOSIS — H903 Sensorineural hearing loss, bilateral: Secondary | ICD-10-CM | POA: Diagnosis not present

## 2015-10-05 ENCOUNTER — Other Ambulatory Visit: Payer: Self-pay | Admitting: Family Medicine

## 2015-10-06 ENCOUNTER — Other Ambulatory Visit: Payer: Self-pay | Admitting: *Deleted

## 2015-10-06 MED ORDER — SERTRALINE HCL 100 MG PO TABS: ORAL_TABLET | ORAL | Status: DC

## 2015-10-07 DIAGNOSIS — I6782 Cerebral ischemia: Secondary | ICD-10-CM | POA: Diagnosis not present

## 2015-10-07 DIAGNOSIS — H9193 Unspecified hearing loss, bilateral: Secondary | ICD-10-CM | POA: Diagnosis not present

## 2015-10-07 DIAGNOSIS — H903 Sensorineural hearing loss, bilateral: Secondary | ICD-10-CM | POA: Diagnosis not present

## 2015-10-08 ENCOUNTER — Ambulatory Visit: Payer: Commercial Managed Care - HMO

## 2015-10-13 DIAGNOSIS — H903 Sensorineural hearing loss, bilateral: Secondary | ICD-10-CM | POA: Diagnosis not present

## 2015-10-20 ENCOUNTER — Telehealth: Payer: Self-pay | Admitting: *Deleted

## 2015-10-20 ENCOUNTER — Other Ambulatory Visit: Payer: Self-pay | Admitting: *Deleted

## 2015-10-20 MED ORDER — METOPROLOL TARTRATE 100 MG PO TABS: ORAL_TABLET | ORAL | Status: DC

## 2015-10-20 MED ORDER — LOSARTAN POTASSIUM 25 MG PO TABS: ORAL_TABLET | ORAL | Status: DC

## 2015-10-20 NOTE — Telephone Encounter (Signed)
Pt called and lvm indicating that her bp was elevated 157/97 she wanted to know if she could take an extra bp pill. Spoke with pcp and she stated that she can take an extra losartan. Noticed that pt's losartan and metoprolol had not been filled since November. Will send a short supply to her local pharmacy. Pt informed and will take losartan and metrprolol.Nancy Cisneros

## 2015-10-21 ENCOUNTER — Other Ambulatory Visit: Payer: Self-pay

## 2015-10-21 ENCOUNTER — Ambulatory Visit: Payer: Commercial Managed Care - HMO

## 2015-10-21 MED ORDER — LOSARTAN POTASSIUM 25 MG PO TABS: ORAL_TABLET | ORAL | Status: DC

## 2015-10-21 MED ORDER — METOPROLOL TARTRATE 100 MG PO TABS: ORAL_TABLET | ORAL | Status: DC

## 2015-10-26 ENCOUNTER — Other Ambulatory Visit: Payer: Self-pay | Admitting: *Deleted

## 2015-10-26 MED ORDER — METOPROLOL TARTRATE 100 MG PO TABS: ORAL_TABLET | ORAL | Status: DC

## 2015-10-26 MED ORDER — LOSARTAN POTASSIUM 25 MG PO TABS: ORAL_TABLET | ORAL | Status: DC

## 2015-11-09 ENCOUNTER — Encounter: Payer: Self-pay | Admitting: Family Medicine

## 2015-11-09 ENCOUNTER — Ambulatory Visit (INDEPENDENT_AMBULATORY_CARE_PROVIDER_SITE_OTHER): Payer: Commercial Managed Care - HMO | Admitting: Family Medicine

## 2015-11-09 ENCOUNTER — Ambulatory Visit (INDEPENDENT_AMBULATORY_CARE_PROVIDER_SITE_OTHER): Payer: Commercial Managed Care - HMO

## 2015-11-09 VITALS — BP 100/64 | HR 69 | Temp 98.6°F | Wt 174.0 lb

## 2015-11-09 DIAGNOSIS — R509 Fever, unspecified: Secondary | ICD-10-CM | POA: Diagnosis not present

## 2015-11-09 DIAGNOSIS — J441 Chronic obstructive pulmonary disease with (acute) exacerbation: Secondary | ICD-10-CM | POA: Diagnosis not present

## 2015-11-09 DIAGNOSIS — I10 Essential (primary) hypertension: Secondary | ICD-10-CM | POA: Diagnosis not present

## 2015-11-09 DIAGNOSIS — J9811 Atelectasis: Secondary | ICD-10-CM | POA: Diagnosis not present

## 2015-11-09 DIAGNOSIS — J449 Chronic obstructive pulmonary disease, unspecified: Secondary | ICD-10-CM

## 2015-11-09 DIAGNOSIS — R05 Cough: Secondary | ICD-10-CM | POA: Diagnosis not present

## 2015-11-09 MED ORDER — IPRATROPIUM-ALBUTEROL 0.5-2.5 (3) MG/3ML IN SOLN
3.0000 mL | Freq: Once | RESPIRATORY_TRACT | Status: AC
  Administered 2015-11-09: 3 mL via RESPIRATORY_TRACT

## 2015-11-09 MED ORDER — LEVOFLOXACIN 750 MG PO TABS: 750.0000 mg | ORAL_TABLET | Freq: Every day | ORAL | Status: DC

## 2015-11-09 MED ORDER — METHYLPREDNISOLONE ACETATE 80 MG/ML IJ SUSP
80.0000 mg | Freq: Once | INTRAMUSCULAR | Status: AC
  Administered 2015-11-09: 80 mg via INTRAMUSCULAR

## 2015-11-09 NOTE — Progress Notes (Signed)
   Subjective:    Patient ID: Nancy Cisneros, female    DOB: Aug 27, 1940, 75 y.o.   MRN: [id number]  HPI She started feeling bad at the end of last week, approximately 4 days ago. She started coughing more expensive more shortness of breath and heaviness in her chest. Cough sounds very productive that she's having hard time getting it out. She did run a low-grade temperature over the weekend. She actually called her office on Friday to get it in. We have actually called her back and offered her a light day appointment but she was unable to get here so decided to keep her appointment for today. She did not use her albuterol this weekend because she said she felt too tired to get the machine out and use it.  Hypertension-she is doing a little better. She says her bottom number blood pressures have come down to the 90s, there are previously running in the 100s. She said it just took a couple weeks with a new medication to really start working well. She has had more headaches recently.   Review of Systems     Objective:   Physical Exam  Constitutional: She is oriented to person, place, and time. She appears well-developed and well-nourished.  HENT:  Head: Normocephalic and atraumatic.  Right Ear: External ear normal.  Left Ear: External ear normal.  Nose: Nose normal.  Mouth/Throat: Oropharynx is clear and moist.  TMs and canals are clear.   Eyes: Conjunctivae and EOM are normal. Pupils are equal, round, and reactive to light.  Neck: Neck supple. No thyromegaly present.  Cardiovascular: Normal rate, regular rhythm and normal heart sounds.   Pulmonary/Chest: Effort normal and breath sounds normal. She has no wheezes.  Diffuse coarse rhonchi  Lymphadenopathy:    She has no cervical adenopathy.  Neurological: She is alert and oriented to person, place, and time.  Skin: Skin is warm and dry.  Psychiatric: She has a normal mood and affect.          Assessment & Plan:  COPD exacerbation-her  pulse ox is down a little bit today around 90% which is lower than her usual which is usually between 94 and 96%. After 2 rounds of albuterol her pulse ox came up to 95% and on second exam she actually had wheezing diffusely. We'll get chest x-ray to rule out early pneumonia. Will call with results once available. Given Depo-Medrol 80 mg IM today as well.  Hypertension- BP is actually less today though it sounds like it's been high at home. Until we get her over her acute illness and her blood pressure starts to come back up I want to hold off on adjusting her medication. But most likely I will end up increasing her amlodipine to help lower her diastolic. Urge her to hydrate well.

## 2015-11-10 MED ORDER — HYDROCODONE-HOMATROPINE 5-1.5 MG/5ML PO SYRP: 5.0000 mL | ORAL_SOLUTION | Freq: Every evening | ORAL | Status: DC | PRN

## 2015-11-12 ENCOUNTER — Ambulatory Visit (INDEPENDENT_AMBULATORY_CARE_PROVIDER_SITE_OTHER): Payer: Commercial Managed Care - HMO | Admitting: Family Medicine

## 2015-11-12 ENCOUNTER — Encounter: Payer: Self-pay | Admitting: Family Medicine

## 2015-11-12 VITALS — BP 126/50 | HR 65 | Temp 98.5°F | Wt 174.0 lb

## 2015-11-12 DIAGNOSIS — J441 Chronic obstructive pulmonary disease with (acute) exacerbation: Secondary | ICD-10-CM | POA: Diagnosis not present

## 2015-11-12 DIAGNOSIS — J189 Pneumonia, unspecified organism: Secondary | ICD-10-CM | POA: Diagnosis not present

## 2015-11-12 DIAGNOSIS — R509 Fever, unspecified: Secondary | ICD-10-CM | POA: Diagnosis not present

## 2015-11-12 MED ORDER — ALBUTEROL SULFATE (2.5 MG/3ML) 0.083% IN NEBU: 2.5000 mg | INHALATION_SOLUTION | Freq: Four times a day (QID) | RESPIRATORY_TRACT | Status: DC | PRN

## 2015-11-12 MED ORDER — METHYLPREDNISOLONE SODIUM SUCC 125 MG IJ SOLR
125.0000 mg | Freq: Once | INTRAMUSCULAR | Status: AC
  Administered 2015-11-12: 125 mg via INTRAMUSCULAR

## 2015-11-12 MED ORDER — METHYLPREDNISOLONE ACETATE 80 MG/ML IJ SUSP
80.0000 mg | Freq: Once | INTRAMUSCULAR | Status: AC
  Administered 2015-11-12: 80 mg via INTRAMUSCULAR

## 2015-11-12 MED ORDER — IPRATROPIUM-ALBUTEROL 0.5-2.5 (3) MG/3ML IN SOLN
3.0000 mL | Freq: Once | RESPIRATORY_TRACT | Status: AC
  Administered 2015-11-12: 3 mL via RESPIRATORY_TRACT

## 2015-11-12 MED ORDER — AMBULATORY NON FORMULARY MEDICATION: Status: DC

## 2015-11-12 NOTE — Patient Instructions (Signed)
Do an albuterol Neb every 4 hours during the daytime.

## 2015-11-12 NOTE — Progress Notes (Signed)
   Subjective:    Patient ID: Nancy Cisneros, female    DOB: Jan 20, 1941, 75 y.o.   MRN: [id number]  HPI Patient's husband had actually called this morning because her pulse ox was dropping down into the low 80s when she first got up this AM. She was seen about 3 days ago for COPD exacerbation. We did a chest x-ray at that time which showed a possible early left lower lobe pneumonia and she was started on Levaquin and given an injection of Depo-Medrol. She says she ran 102 fever on Monday night.  No known exposure to the flu.  Pick up the Levaquin and says she still has about 2 days left. She had to do a nebulizer treatment at 4:30 this morning. But she said when she checked her box she realized that her albuterol had expired.   Review of Systems     Objective:   Physical Exam  Constitutional: She is oriented to person, place, and time. She appears well-developed and well-nourished.  HENT:  Head: Normocephalic and atraumatic.  Right Ear: External ear normal.  Left Ear: External ear normal.  Nose: Nose normal.  Mouth/Throat: Oropharynx is clear and moist.  TMs and canals are clear.   Eyes: Conjunctivae and EOM are normal. Pupils are equal, round, and reactive to light.  Neck: Neck supple. No thyromegaly present.  Cardiovascular: Normal rate, regular rhythm and normal heart sounds.   Pulmonary/Chest: Effort normal. She has wheezes.  Diffuse wheezing and rhonchi.  Lymphadenopathy:    She has no cervical adenopathy.  Neurological: She is alert and oriented to person, place, and time.  Skin: Skin is warm and dry.  Psychiatric: She has a normal mood and affect.          Assessment & Plan:  COPD exacerbation with left lower lobe pneumonia- Given another.Depo-Medrol 80 mg injection as well as Solu-Medrol 125 mg.  Her oxygen is 85% on room air.  Will get home oxygen therapy.  We placed her on 2 L of oxygen and her level came up to about 92%. After 2 nebulizer treatments her oxygen came up to  89% on room air. I think at this point would be best for her to have home oxygen while she has pneumonia. We are going to try to keep her out of the hospital. Certainly if she becomes more short of breath or runs a fever or starts to get dehydrated feel nauseated or vomit and she needs to go to the emergency department over the weekend. We will call her tomorrow and check on her make sure that she was able to receive her oxygen from home health and to see if she is feeling better. Encouraged her to make sure that she completes the Levaquin.

## 2015-11-13 ENCOUNTER — Encounter: Payer: Self-pay | Admitting: Family Medicine

## 2015-11-13 ENCOUNTER — Telehealth: Payer: Self-pay | Admitting: Family Medicine

## 2015-11-13 NOTE — Telephone Encounter (Signed)
Pt states her O2 did get delivered yesterday. She used it overnight last night and her O2 was 95% this am. Pt questions if she is supposed to use it all the time or just at night. Will route to PCP for review.

## 2015-11-13 NOTE — Telephone Encounter (Signed)
Please call patient and see how she's feeling and to make sure that her oxygen got delivered.

## 2015-11-13 NOTE — Telephone Encounter (Signed)
Pt advised of recommendation. verbalized understanding.

## 2015-11-13 NOTE — Telephone Encounter (Signed)
Yes use 24/7.

## 2015-11-16 ENCOUNTER — Encounter: Payer: Self-pay | Admitting: Family Medicine

## 2015-11-16 ENCOUNTER — Ambulatory Visit (INDEPENDENT_AMBULATORY_CARE_PROVIDER_SITE_OTHER): Payer: Commercial Managed Care - HMO | Admitting: Family Medicine

## 2015-11-16 VITALS — BP 134/63 | HR 68 | Wt 168.0 lb

## 2015-11-16 DIAGNOSIS — J449 Chronic obstructive pulmonary disease, unspecified: Secondary | ICD-10-CM

## 2015-11-16 DIAGNOSIS — F17200 Nicotine dependence, unspecified, uncomplicated: Secondary | ICD-10-CM | POA: Diagnosis not present

## 2015-11-16 DIAGNOSIS — J189 Pneumonia, unspecified organism: Secondary | ICD-10-CM

## 2015-11-16 MED ORDER — AMBULATORY NON FORMULARY MEDICATION: Status: DC

## 2015-11-16 NOTE — Patient Instructions (Signed)
Gated to the imaging department in 1-2 weeks or repeat chest x-ray at your convenience.

## 2015-11-16 NOTE — Progress Notes (Signed)
   Subjective:    Patient ID: Nancy Cisneros, female    DOB: [birth year], 75 y.o.   MRN: [id number]  HPI F/U CAP(LLL) with COPD exacerbation.I had her come back this week for recheck.  We discharged her home with oxygen as she was satting 82% at home.  She also had a low diastolic pressure at her last OV.  Treated with Levaquin.  She's feeling much much better overall. She still has a productive cough but the wheezing and shortness of breath has improved significantly as well as her energy levels. She's not running a fever.   Review of Systems     Objective:   Physical Exam  Constitutional: She is oriented to person, place, and time. She appears well-developed and well-nourished.  HENT:  Head: Normocephalic and atraumatic.  Cardiovascular: Normal rate, regular rhythm and normal heart sounds.   Pulmonary/Chest: Effort normal and breath sounds normal.  Neurological: She is alert and oriented to person, place, and time.  Skin: Skin is warm and dry.  Psychiatric: She has a normal mood and affect. Her behavior is normal.          Assessment & Plan:  CAP/COPD- she is doing well off her oxygen.  Continue Symbicort twice a day and albuterol as needed. Do think we should have the home health company do an overnight pulse oximetry on her. I do think she might qualify for overnight oxygen.   She did well on her walk test today. Will continue oxygen for 1 more week until we get her chest x-ray back next week.

## 2015-11-23 ENCOUNTER — Ambulatory Visit (INDEPENDENT_AMBULATORY_CARE_PROVIDER_SITE_OTHER): Payer: Commercial Managed Care - HMO

## 2015-11-23 DIAGNOSIS — F17200 Nicotine dependence, unspecified, uncomplicated: Secondary | ICD-10-CM | POA: Diagnosis not present

## 2015-11-23 DIAGNOSIS — J449 Chronic obstructive pulmonary disease, unspecified: Secondary | ICD-10-CM

## 2015-11-23 DIAGNOSIS — J189 Pneumonia, unspecified organism: Secondary | ICD-10-CM | POA: Diagnosis not present

## 2015-11-30 ENCOUNTER — Telehealth: Payer: Self-pay | Admitting: Family Medicine

## 2015-11-30 NOTE — Telephone Encounter (Signed)
Patient called and stated she needed a new [place] for her Neurologist. I sent through [employer] and I am waiting on authorization. - CF

## 2015-12-01 NOTE — Telephone Encounter (Signed)
Received [employer] authorization # [id number] good for 6 visits from 4/10 - 10/9. - CF

## 2015-12-02 DIAGNOSIS — G5602 Carpal tunnel syndrome, left upper limb: Secondary | ICD-10-CM | POA: Diagnosis not present

## 2015-12-02 DIAGNOSIS — G2581 Restless legs syndrome: Secondary | ICD-10-CM | POA: Diagnosis not present

## 2015-12-02 DIAGNOSIS — G5601 Carpal tunnel syndrome, right upper limb: Secondary | ICD-10-CM | POA: Diagnosis not present

## 2015-12-02 DIAGNOSIS — M5441 Lumbago with sciatica, right side: Secondary | ICD-10-CM | POA: Diagnosis not present

## 2015-12-02 DIAGNOSIS — G603 Idiopathic progressive neuropathy: Secondary | ICD-10-CM | POA: Diagnosis not present

## 2015-12-02 DIAGNOSIS — M5442 Lumbago with sciatica, left side: Secondary | ICD-10-CM | POA: Diagnosis not present

## 2015-12-07 ENCOUNTER — Other Ambulatory Visit: Payer: Self-pay | Admitting: Family Medicine

## 2015-12-07 ENCOUNTER — Telehealth: Payer: Self-pay

## 2015-12-07 DIAGNOSIS — J189 Pneumonia, unspecified organism: Secondary | ICD-10-CM

## 2015-12-07 DIAGNOSIS — J441 Chronic obstructive pulmonary disease with (acute) exacerbation: Secondary | ICD-10-CM

## 2015-12-07 NOTE — Telephone Encounter (Signed)
Ok to call [facility] and have them pick it up. They may need a d/C order. Ok to fax over.

## 2015-12-07 NOTE — Telephone Encounter (Signed)
Patient needs to DC her oxygen. She states she no longer needs it. She has it through [place].

## 2015-12-08 MED ORDER — AMBULATORY NON FORMULARY MEDICATION: Status: DC

## 2015-12-08 NOTE — Telephone Encounter (Signed)
Sent D/C order to [facility].

## 2015-12-09 ENCOUNTER — Other Ambulatory Visit: Payer: Self-pay | Admitting: *Deleted

## 2015-12-09 MED ORDER — ALBUTEROL SULFATE (2.5 MG/3ML) 0.083% IN NEBU: 2.5000 mg | INHALATION_SOLUTION | Freq: Four times a day (QID) | RESPIRATORY_TRACT | Status: DC | PRN

## 2015-12-13 DIAGNOSIS — J189 Pneumonia, unspecified organism: Secondary | ICD-10-CM | POA: Diagnosis not present

## 2015-12-13 DIAGNOSIS — J441 Chronic obstructive pulmonary disease with (acute) exacerbation: Secondary | ICD-10-CM | POA: Diagnosis not present

## 2015-12-13 DIAGNOSIS — R509 Fever, unspecified: Secondary | ICD-10-CM | POA: Diagnosis not present

## 2015-12-17 ENCOUNTER — Ambulatory Visit (INDEPENDENT_AMBULATORY_CARE_PROVIDER_SITE_OTHER): Payer: Commercial Managed Care - HMO | Admitting: Family Medicine

## 2015-12-17 ENCOUNTER — Encounter: Payer: Self-pay | Admitting: Family Medicine

## 2015-12-17 VITALS — BP 131/66 | HR 98 | Wt 167.0 lb

## 2015-12-17 DIAGNOSIS — J441 Chronic obstructive pulmonary disease with (acute) exacerbation: Secondary | ICD-10-CM | POA: Diagnosis not present

## 2015-12-17 DIAGNOSIS — S60222A Contusion of left hand, initial encounter: Secondary | ICD-10-CM | POA: Diagnosis not present

## 2015-12-17 MED ORDER — PREDNISONE 20 MG PO TABS: 40.0000 mg | ORAL_TABLET | Freq: Every day | ORAL | Status: DC

## 2015-12-17 NOTE — Progress Notes (Signed)
   Subjective:    Patient ID: Nancy Cisneros, female    DOB: March 08, 1941, 75 y.o.   MRN: [id number]  HPI Patient was here 4 weeks ago for follow-up pneumonia. At that time she was doing much better. Her symptoms were improving and she was able to come off of her oxygen during the daytime. She had a normal walk test. Patient to call the office yesterday with persistent symptoms.  C/O cough with millk white sputum. No fever.   She had awakened at 4:30 this moment an albuterol treatment. She actually feels a little bit better today.  Bruise on the back of her hand and wrist and was picking up a pot and hit it against door on Sat, 6 days ago.     Review of Systems     Objective:   Physical Exam  Constitutional: She is oriented to person, place, and time. She appears well-developed and well-nourished.  HENT:  Head: Normocephalic and atraumatic.  Cardiovascular: Normal rate, regular rhythm and normal heart sounds.   Pulmonary/Chest: Effort normal and breath sounds normal.  Diffuse rhonchi, no crackles.   Neurological: She is alert and oriented to person, place, and time.  Skin: Skin is warm and dry.  Psychiatric: She has a normal mood and affect. Her behavior is normal.   Left hand with a contusion over the dorsum of the hand going up to the wrist. Wrist with normal range of motion. Fingers with normal range of motion and strength. Nontender over the MCs. There is smaller approx 1 cm hematoma on the dorsum of teh hand.       Assessment & Plan:  COPD Exacerbation- we'll go ahead and treat with 5 days of prednisone. I opted not to treat with an antibiotic today as she has not had a change in color increase in serum production or fever.  Contusion, left hand-recommend compression to help improve healing. Is fairly normal. No sign of fracture. If not improving them please let me know and we can always get an x-ray.

## 2015-12-22 ENCOUNTER — Other Ambulatory Visit: Payer: Self-pay | Admitting: Family Medicine

## 2016-01-12 DIAGNOSIS — R509 Fever, unspecified: Secondary | ICD-10-CM | POA: Diagnosis not present

## 2016-01-12 DIAGNOSIS — J441 Chronic obstructive pulmonary disease with (acute) exacerbation: Secondary | ICD-10-CM | POA: Diagnosis not present

## 2016-01-12 DIAGNOSIS — J189 Pneumonia, unspecified organism: Secondary | ICD-10-CM | POA: Diagnosis not present

## 2016-01-15 ENCOUNTER — Encounter: Payer: Self-pay | Admitting: Family Medicine

## 2016-01-15 ENCOUNTER — Ambulatory Visit (INDEPENDENT_AMBULATORY_CARE_PROVIDER_SITE_OTHER): Payer: Commercial Managed Care - HMO | Admitting: Family Medicine

## 2016-01-15 VITALS — BP 138/69 | HR 78 | Wt 167.0 lb

## 2016-01-15 DIAGNOSIS — R0902 Hypoxemia: Secondary | ICD-10-CM | POA: Diagnosis not present

## 2016-01-15 DIAGNOSIS — D239 Other benign neoplasm of skin, unspecified: Secondary | ICD-10-CM

## 2016-01-15 DIAGNOSIS — I10 Essential (primary) hypertension: Secondary | ICD-10-CM

## 2016-01-15 DIAGNOSIS — J449 Chronic obstructive pulmonary disease, unspecified: Secondary | ICD-10-CM | POA: Diagnosis not present

## 2016-01-15 DIAGNOSIS — R0683 Snoring: Secondary | ICD-10-CM

## 2016-01-15 DIAGNOSIS — R252 Cramp and spasm: Secondary | ICD-10-CM

## 2016-01-15 DIAGNOSIS — Z13820 Encounter for screening for osteoporosis: Secondary | ICD-10-CM

## 2016-01-15 DIAGNOSIS — D229 Melanocytic nevi, unspecified: Secondary | ICD-10-CM

## 2016-01-15 MED ORDER — METOPROLOL TARTRATE 100 MG PO TABS: 100.0000 mg | ORAL_TABLET | Freq: Two times a day (BID) | ORAL | Status: DC

## 2016-01-15 MED ORDER — LOSARTAN POTASSIUM 25 MG PO TABS: 25.0000 mg | ORAL_TABLET | Freq: Every day | ORAL | Status: DC

## 2016-01-15 NOTE — Progress Notes (Signed)
Subjective:    CC: COPD  HPI:  COPD - Seen for a COPD exacerbation about 4 weeks ago and for pneumonia. She is much better today. She still has some cough and some sputum production but significantly better. No recent fevers or chills or sweats. She says she is using her Symbicort daily and using her memory machine every couple of days in the morning.  Hypertension- Pt denies chest pain, SOB, dizziness, or heart palpitations.  Taking meds as directed w/o problems.  Denies medication side effects.    She still been wearing her oxygen at night. She did get a home sleep study kit. She hasn't done it yet had some questions about his her brought in to assess today.  Also c/o cramping in her legs, feet and hands over the last 2 months.  Sometimes wakes her up at night. Sometimes happens when she is eating.  No new exercise routine.    She also has a lesion on the bridge of her nose. She says it has been there for quite some time. She says occasionally gets sore but does not bleed. He does have a history significant for sun exposure.  Past medical history, Surgical history, Family history not pertinant except as noted below, Social history, Allergies, and medications have been entered into the medical record, reviewed, and corrections made.   Review of Systems: No fevers, chills, night sweats, weight loss, chest pain, or shortness of breath.   Objective:    General: Well Developed, well nourished, and in no acute distress.  Neuro: Alert and oriented x3, extra-ocular muscles intact, sensation grossly intact.  HEENT: Normocephalic, atraumatic  Skin: Warm and dry, no rashes. Cardiac: Regular rate and rhythm, no murmurs rubs or gallops, no lower extremity edema.  Respiratory: Clear to auscultation bilaterally. Not using accessory muscles, speaking in full sentences. Skin: There is a small slightly brown lesion over the bridge of the nose is slightly elevated. She does have makeup on today so it's  difficult to get a good view of it.   Impression and Recommendations:    HTN - Well controlled. Continue current regimen. Follow up in 6 months. Medications refilled for the next 6 months.  COPD- f/U in 3 months.  Still having some productive intermittent cough. Monitor carefully. Also encouraged her to start using her flutter valve twice a day over the next couple of weeks to really help move the mucus from her chest.  Overnight hypoxemia and snoring-she will do the home sleep study kit.Will await results. She says she was screened for sleep apnea years ago.  Muscle cramping - Recommend initially screen for electrolyte disturbance. Will check for calcium potassium and magnesium levels. Will call with results once available. Did encourage her to work on making sure that she is hydrating well and to work on gentle stretches before bedtime. Is been no recent increase or decrease in activity level that really would have triggered this. None of her medications are new either.  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

## 2016-01-20 ENCOUNTER — Ambulatory Visit (INDEPENDENT_AMBULATORY_CARE_PROVIDER_SITE_OTHER): Payer: Commercial Managed Care - HMO

## 2016-01-20 DIAGNOSIS — Z13820 Encounter for screening for osteoporosis: Secondary | ICD-10-CM

## 2016-01-20 DIAGNOSIS — M85852 Other specified disorders of bone density and structure, left thigh: Secondary | ICD-10-CM | POA: Diagnosis not present

## 2016-01-20 DIAGNOSIS — I10 Essential (primary) hypertension: Secondary | ICD-10-CM | POA: Diagnosis not present

## 2016-01-20 DIAGNOSIS — R252 Cramp and spasm: Secondary | ICD-10-CM | POA: Diagnosis not present

## 2016-01-20 LAB — CBC WITH DIFFERENTIAL/PLATELET
BASOS ABS: 0 {cells}/uL (ref 0–200)
Basophils Relative: 0 %
EOS ABS: 80 {cells}/uL (ref 15–500)
Eosinophils Relative: 1 %
HEMATOCRIT: 40 % (ref 35.0–45.0)
HEMOGLOBIN: 13.5 g/dL (ref 11.7–15.5)
LYMPHS ABS: 1680 {cells}/uL (ref 850–3900)
Lymphocytes Relative: 21 %
MCH: 30.9 pg (ref 27.0–33.0)
MCHC: 33.8 g/dL (ref 32.0–36.0)
MCV: 91.5 fL (ref 80.0–100.0)
MONO ABS: 400 {cells}/uL (ref 200–950)
MPV: 10.6 fL (ref 7.5–12.5)
Monocytes Relative: 5 %
NEUTROS ABS: 5840 {cells}/uL (ref 1500–7800)
NEUTROS PCT: 73 %
Platelets: 220 10*3/uL (ref 140–400)
RBC: 4.37 MIL/uL (ref 3.80–5.10)
RDW: 15.3 % — ABNORMAL HIGH (ref 11.0–15.0)
WBC: 8 10*3/uL (ref 3.8–10.8)

## 2016-01-21 DIAGNOSIS — G4733 Obstructive sleep apnea (adult) (pediatric): Secondary | ICD-10-CM | POA: Diagnosis not present

## 2016-01-21 LAB — LIPID PANEL
CHOL/HDL RATIO: 4.8 ratio (ref <=5.0)
CHOLESTEROL: 220 mg/dL — AB (ref 125–200)
HDL: 46 mg/dL (ref >=46)
LDL Cholesterol: 141 mg/dL — ABNORMAL HIGH (ref <130)
Triglycerides: 167 mg/dL — ABNORMAL HIGH (ref <150)
VLDL: 33 mg/dL — ABNORMAL HIGH (ref <30)

## 2016-01-21 LAB — COMPLETE METABOLIC PANEL WITH GFR
ALBUMIN: 3.9 g/dL (ref 3.6–5.1)
ALK PHOS: 70 U/L (ref 33–130)
ALT: 11 U/L (ref 6–29)
AST: 14 U/L (ref 10–35)
BILIRUBIN TOTAL: 0.4 mg/dL (ref 0.2–1.2)
BUN: 12 mg/dL (ref 7–25)
CALCIUM: 9.7 mg/dL (ref 8.6–10.4)
CO2: 26 mmol/L (ref 20–31)
Chloride: 105 mmol/L (ref 98–110)
Creat: 0.93 mg/dL (ref 0.60–0.93)
GFR, Est African American: 69 mL/min (ref >=60)
GFR, Est Non African American: 60 mL/min (ref >=60)
GLUCOSE: 93 mg/dL (ref 65–99)
POTASSIUM: 4.6 mmol/L (ref 3.5–5.3)
Sodium: 140 mmol/L (ref 135–146)
TOTAL PROTEIN: 6.2 g/dL (ref 6.1–8.1)

## 2016-01-21 LAB — TSH: TSH: 2.54 mIU/L

## 2016-01-21 LAB — MAGNESIUM: Magnesium: 1.8 mg/dL (ref 1.5–2.5)

## 2016-01-27 ENCOUNTER — Telehealth: Payer: Self-pay | Admitting: Family Medicine

## 2016-01-27 NOTE — Telephone Encounter (Signed)
Call patient: I did receive the results for home sleep study. It actually does look like she has some mild sleep apnea. They recommend a CPAP mask at night with oxygen as she also dropped her oxygen level low at 89% for over an hour during the night. We will need a download after one week so that we can actually set her pressure. Will initially be set on an auto titration that will help [place] figure out what pressure she needs. We'll also still give HER-2 liters of oxygen as well.bar

## 2016-01-27 NOTE — Telephone Encounter (Signed)
lvm informing pt of recommendations. .Nicoletta Hush [place]  

## 2016-01-28 DIAGNOSIS — M5442 Lumbago with sciatica, left side: Secondary | ICD-10-CM | POA: Diagnosis not present

## 2016-01-28 DIAGNOSIS — M5417 Radiculopathy, lumbosacral region: Secondary | ICD-10-CM | POA: Diagnosis not present

## 2016-01-28 DIAGNOSIS — G5601 Carpal tunnel syndrome, right upper limb: Secondary | ICD-10-CM | POA: Diagnosis not present

## 2016-01-28 DIAGNOSIS — G603 Idiopathic progressive neuropathy: Secondary | ICD-10-CM | POA: Diagnosis not present

## 2016-01-28 DIAGNOSIS — M5441 Lumbago with sciatica, right side: Secondary | ICD-10-CM | POA: Diagnosis not present

## 2016-01-28 DIAGNOSIS — G5602 Carpal tunnel syndrome, left upper limb: Secondary | ICD-10-CM | POA: Diagnosis not present

## 2016-02-02 ENCOUNTER — Encounter: Payer: Self-pay | Admitting: Family Medicine

## 2016-02-02 DIAGNOSIS — L570 Actinic keratosis: Secondary | ICD-10-CM | POA: Diagnosis not present

## 2016-02-02 DIAGNOSIS — D692 Other nonthrombocytopenic purpura: Secondary | ICD-10-CM | POA: Diagnosis not present

## 2016-02-10 DIAGNOSIS — G4733 Obstructive sleep apnea (adult) (pediatric): Secondary | ICD-10-CM | POA: Diagnosis not present

## 2016-02-10 DIAGNOSIS — R0902 Hypoxemia: Secondary | ICD-10-CM | POA: Diagnosis not present

## 2016-02-10 DIAGNOSIS — R0683 Snoring: Secondary | ICD-10-CM | POA: Diagnosis not present

## 2016-02-10 DIAGNOSIS — I10 Essential (primary) hypertension: Secondary | ICD-10-CM | POA: Diagnosis not present

## 2016-02-12 DIAGNOSIS — J441 Chronic obstructive pulmonary disease with (acute) exacerbation: Secondary | ICD-10-CM | POA: Diagnosis not present

## 2016-02-12 DIAGNOSIS — R509 Fever, unspecified: Secondary | ICD-10-CM | POA: Diagnosis not present

## 2016-02-12 DIAGNOSIS — J189 Pneumonia, unspecified organism: Secondary | ICD-10-CM | POA: Diagnosis not present

## 2016-02-15 ENCOUNTER — Telehealth: Payer: Self-pay | Admitting: *Deleted

## 2016-02-15 DIAGNOSIS — G4733 Obstructive sleep apnea (adult) (pediatric): Secondary | ICD-10-CM | POA: Diagnosis not present

## 2016-02-15 NOTE — Telephone Encounter (Signed)
Called pt back and she stated that [name] has not come out to show her how to use the machine for CPAP. When the equipment was delivered she asked the guy about how to use and he informed her that he just delivers the equipment. She said that she contacted her insurance and they use [employer] and she would like to transfer the service to them. She stated that she has had so many problems with [name] that she would feel better going thru their company.Nancy Cisneros

## 2016-02-15 NOTE — Telephone Encounter (Signed)
Pt called and lvm stating that she is having trouble with [facility] and will need to get the orders switched to [facility].Nancy Cisneros [place]

## 2016-02-16 NOTE — Telephone Encounter (Signed)
Jessicah called and wants to talk with Tonya. She would not tell me what it was about.

## 2016-02-17 NOTE — Telephone Encounter (Signed)
Pt informed that order has been sent.Nancy Cisneros

## 2016-02-17 NOTE — Telephone Encounter (Signed)
pt's information faxed to [place].Nancy Cisneros [place]

## 2016-03-08 ENCOUNTER — Telehealth: Payer: Self-pay

## 2016-03-08 DIAGNOSIS — M25472 Effusion, left ankle: Secondary | ICD-10-CM

## 2016-03-08 DIAGNOSIS — M25572 Pain in left ankle and joints of left foot: Secondary | ICD-10-CM

## 2016-03-08 NOTE — Telephone Encounter (Signed)
Patient called to get a referral to Dr Jaynee Eagles for left ankle swelling and left ankle pain.

## 2016-03-11 DIAGNOSIS — I10 Essential (primary) hypertension: Secondary | ICD-10-CM | POA: Diagnosis not present

## 2016-03-11 DIAGNOSIS — R0902 Hypoxemia: Secondary | ICD-10-CM | POA: Diagnosis not present

## 2016-03-11 DIAGNOSIS — G4733 Obstructive sleep apnea (adult) (pediatric): Secondary | ICD-10-CM | POA: Diagnosis not present

## 2016-03-11 DIAGNOSIS — R0683 Snoring: Secondary | ICD-10-CM | POA: Diagnosis not present

## 2016-03-13 DIAGNOSIS — J189 Pneumonia, unspecified organism: Secondary | ICD-10-CM | POA: Diagnosis not present

## 2016-03-13 DIAGNOSIS — R509 Fever, unspecified: Secondary | ICD-10-CM | POA: Diagnosis not present

## 2016-03-13 DIAGNOSIS — J441 Chronic obstructive pulmonary disease with (acute) exacerbation: Secondary | ICD-10-CM | POA: Diagnosis not present

## 2016-03-14 ENCOUNTER — Encounter: Payer: Self-pay | Admitting: Family Medicine

## 2016-03-14 ENCOUNTER — Ambulatory Visit (INDEPENDENT_AMBULATORY_CARE_PROVIDER_SITE_OTHER): Payer: Commercial Managed Care - HMO | Admitting: Family Medicine

## 2016-03-14 VITALS — BP 118/63 | HR 72 | Wt 165.0 lb

## 2016-03-14 DIAGNOSIS — M76891 Other specified enthesopathies of right lower limb, excluding foot: Secondary | ICD-10-CM

## 2016-03-14 DIAGNOSIS — J441 Chronic obstructive pulmonary disease with (acute) exacerbation: Secondary | ICD-10-CM

## 2016-03-14 DIAGNOSIS — M65851 Other synovitis and tenosynovitis, right thigh: Secondary | ICD-10-CM | POA: Diagnosis not present

## 2016-03-14 MED ORDER — IPRATROPIUM-ALBUTEROL 0.5-2.5 (3) MG/3ML IN SOLN
3.0000 mL | Freq: Once | RESPIRATORY_TRACT | Status: AC
  Administered 2016-03-14: 3 mL via RESPIRATORY_TRACT

## 2016-03-14 MED ORDER — METHYLPREDNISOLONE ACETATE 80 MG/ML IJ SUSP
80.0000 mg | Freq: Once | INTRAMUSCULAR | Status: AC
  Administered 2016-03-14: 80 mg via INTRAMUSCULAR

## 2016-03-14 MED ORDER — PREDNISONE 20 MG PO TABS: 40.0000 mg | ORAL_TABLET | Freq: Every day | ORAL | 0 refills | Status: DC

## 2016-03-14 MED ORDER — AZITHROMYCIN 250 MG PO TABS: ORAL_TABLET | ORAL | 0 refills | Status: AC

## 2016-03-14 MED ORDER — HYDROCODONE-HOMATROPINE 5-1.5 MG/5ML PO SYRP: 5.0000 mL | ORAL_SOLUTION | Freq: Every evening | ORAL | 0 refills | Status: DC | PRN

## 2016-03-14 NOTE — Patient Instructions (Signed)
Use your albuterol every 4 hours for the next couple of days.

## 2016-03-14 NOTE — Progress Notes (Signed)
Subjective:    CC:  COPD exacerbation  HPI:  She has felt more SOB x 4 days, on Friday.  She is started coughing up brown sputum over the last 3 days. She denies any fevers chills or sweats but has noticed more shortness of breath with activity. She also has had a very sore throat for about 4 days with some postnasal drip. She also reports some pain below her left eye over the facial cheek for about a week and a half.  Also c/o of pain at the right hip crease. Says painful when lifts her leg to get out of the car.  No trauma. Not taking any medications for it.  No prior hx of hip problems.  He denies any swelling in that area.  Past medical history, Surgical history, Family history not pertinant except as noted below, Social history, Allergies, and medications have been entered into the medical record, reviewed, and corrections made.   Review of Systems: No fevers, chills, night sweats, weight loss, chest pain, or shortness of breath.   Objective:    General: Well Developed, well nourished, and in no acute distress.  Neuro: Alert and oriented x3, extra-ocular muscles intact, sensation grossly intact.  HEENT: Normocephalic, atraumatic, Oropharynx with some cobblestoning. TMs and canals are clear bilaterally. No significant cervical lymphadenopathy.  Skin: Warm and dry, no rashes. Cardiac: Regular rate and rhythm, no murmurs rubs or gallops, no lower extremity edema.  Respiratory: Diffuse rhonchi and laterally. Not using accessory muscles, speaking in full sentences. Ext: Pain at the right hip flexor with flexion against resistance. Negative straight leg raise. No pain at the hip with internal or external rotation. Strength at the hip knee and ankle is 5 out of 5 bilaterally. Patellar reflex 2+ bilaterally.   Impression and Recommendations:    COPD exacerbation - With azithromycin and prednisone. Given Depo-Medrol here in the office. Follow-up in 2 weeks. Call if not better in one  week.  Right hip flexor strain - based on her exam and history suspect hip flexor strain. Handout given on stretches to do on her own at home. If not improving then consider further evaluation with possible x-ray. Based on her age it would not be surprising if she has some hip arthritis as well.

## 2016-03-15 ENCOUNTER — Telehealth: Payer: Self-pay

## 2016-03-15 DIAGNOSIS — N949 Unspecified condition associated with female genital organs and menstrual cycle: Secondary | ICD-10-CM

## 2016-03-15 NOTE — Telephone Encounter (Signed)
Patient complains of a large area in her vagina. Dr. Orlean Bradford. Billie Ruddy, [place], [place] Address: [address], [place], [place] [address] Phone: [phone]

## 2016-03-16 ENCOUNTER — Other Ambulatory Visit: Payer: Self-pay | Admitting: Family Medicine

## 2016-03-17 ENCOUNTER — Other Ambulatory Visit: Payer: Self-pay | Admitting: Family Medicine

## 2016-03-17 DIAGNOSIS — N8110 Cystocele, unspecified: Secondary | ICD-10-CM | POA: Diagnosis not present

## 2016-03-17 DIAGNOSIS — N390 Urinary tract infection, site not specified: Secondary | ICD-10-CM | POA: Diagnosis not present

## 2016-03-22 DIAGNOSIS — M7989 Other specified soft tissue disorders: Secondary | ICD-10-CM | POA: Diagnosis not present

## 2016-03-22 DIAGNOSIS — M79662 Pain in left lower leg: Secondary | ICD-10-CM | POA: Diagnosis not present

## 2016-03-25 ENCOUNTER — Other Ambulatory Visit: Payer: Self-pay | Admitting: Family Medicine

## 2016-03-28 ENCOUNTER — Ambulatory Visit: Payer: Commercial Managed Care - HMO | Admitting: Family Medicine

## 2016-04-04 ENCOUNTER — Ambulatory Visit: Payer: Commercial Managed Care - HMO | Admitting: Family Medicine

## 2016-04-11 DIAGNOSIS — R0683 Snoring: Secondary | ICD-10-CM | POA: Diagnosis not present

## 2016-04-11 DIAGNOSIS — G4733 Obstructive sleep apnea (adult) (pediatric): Secondary | ICD-10-CM | POA: Diagnosis not present

## 2016-04-11 DIAGNOSIS — I10 Essential (primary) hypertension: Secondary | ICD-10-CM | POA: Diagnosis not present

## 2016-04-11 DIAGNOSIS — R0902 Hypoxemia: Secondary | ICD-10-CM | POA: Diagnosis not present

## 2016-04-13 DIAGNOSIS — J441 Chronic obstructive pulmonary disease with (acute) exacerbation: Secondary | ICD-10-CM | POA: Diagnosis not present

## 2016-04-13 DIAGNOSIS — R509 Fever, unspecified: Secondary | ICD-10-CM | POA: Diagnosis not present

## 2016-04-13 DIAGNOSIS — J189 Pneumonia, unspecified organism: Secondary | ICD-10-CM | POA: Diagnosis not present

## 2016-04-14 DIAGNOSIS — N816 Rectocele: Secondary | ICD-10-CM | POA: Diagnosis not present

## 2016-04-14 DIAGNOSIS — N8110 Cystocele, unspecified: Secondary | ICD-10-CM | POA: Diagnosis not present

## 2016-04-15 ENCOUNTER — Ambulatory Visit (INDEPENDENT_AMBULATORY_CARE_PROVIDER_SITE_OTHER): Payer: Commercial Managed Care - HMO | Admitting: Family Medicine

## 2016-04-15 ENCOUNTER — Encounter: Payer: Self-pay | Admitting: Family Medicine

## 2016-04-15 VITALS — BP 126/74 | HR 60 | Ht 65.0 in | Wt 170.0 lb

## 2016-04-15 DIAGNOSIS — N8110 Cystocele, unspecified: Secondary | ICD-10-CM | POA: Diagnosis not present

## 2016-04-15 DIAGNOSIS — J449 Chronic obstructive pulmonary disease, unspecified: Secondary | ICD-10-CM | POA: Diagnosis not present

## 2016-04-15 DIAGNOSIS — IMO0002 Reserved for concepts with insufficient information to code with codable children: Secondary | ICD-10-CM

## 2016-04-15 DIAGNOSIS — F411 Generalized anxiety disorder: Secondary | ICD-10-CM

## 2016-04-15 DIAGNOSIS — F17200 Nicotine dependence, unspecified, uncomplicated: Secondary | ICD-10-CM

## 2016-04-15 NOTE — Progress Notes (Signed)
Subjective:    CC: COPD, Mood  HPI: COPD - Patient here today to follow-up for COPD. She had a COPD exacerbation about 4 weeks ago and was treated with azithromycin and prednisone. She is also given Depo-Medrol in the office.  She is on Symbicort and atrovent.  She has not been using either medication regularly. She does feel better since she's been using her CPAP more consistently. She has also quit smoking cigarettes but is using a vaporizer. It does still have some nicotine and it but her plan is to wean down on the nicotine content.  Follow-up generalized anxiety disorder-early on sertraline 100 mg daily.  Patient also requesting a referral to urology today. She called back in July and said she had noticed a lump in her vagina and wanted to see a GYN but needed a referral. We placed that to OB/GYN. She is now requesting referral to urology but we do not have any notes from the GYN office.  She was actually seen in their office and had some testing done yesterday. The swelling is likely have not received a note yet. They felt like her symptoms were due to her bladder and recommended that she would probably benefit from a urology referral. She had a bladder TAC years ago.  BP 126/74 (BP Location: Left Arm, Cuff Size: Normal)   Pulse 60   Ht 5\' 5"  (1.651 m)   Wt 170 lb (77.1 kg)   SpO2 99%   BMI 28.29 kg/m     Allergies  Allergen Reactions  . Codeine     REACTION: trouble breathing  . Ramipril     REACTION: cough    Past Medical History:  Diagnosis Date  . Adrenal adenoma    1 cm Left  . CAD (coronary artery disease)    Dr Prince Rome, [facility] cardiology  . COPD (chronic obstructive pulmonary disease) (HCC)    lung nodule- Dr Tilden Dome  . Diverticulosis of colon   . Hip pain, left   . Hyperlipidemia   . Hypertension     Past Surgical History:  Procedure Laterality Date  . Lynx mid urethral sling     Dr Beverley Fiedler    Social History   Social History  . Marital status:  Married    Spouse name: N/A  . Number of children: N/A  . Years of education: N/A   Occupational History  . Not on file.   Social History Main Topics  . Smoking status: Former Smoker    Packs/day: 0.40    Quit date: 03/28/2016  . Smokeless tobacco: Never Used  . Alcohol use No  . Drug use: Unknown  . Sexual activity: Yes   Other Topics Concern  . Not on file   Social History Narrative  . No narrative on file    Family History  Problem Relation Age of Onset  . Hyperlipidemia Mother   . Hypertension Mother   . Cancer Father     Oat cell lung CA  . Alcohol abuse Father     Outpatient Encounter Prescriptions as of 04/15/2016  Medication Sig  . albuterol (PROAIR HFA) 108 (90 BASE) MCG/ACT inhaler Inhale 2-4 puffs into the lungs every 6 (six) hours as needed.  Marland Kitchen albuterol (PROVENTIL) (2.5 MG/3ML) 0.083% nebulizer solution Take 3 mLs (2.5 mg total) by nebulization every 6 (six) hours as needed for wheezing or shortness of breath.  Marland Kitchen amLODipine (NORVASC) 5 MG tablet TAKE 1 TABLET EVERY DAY  . aspirin 81 MG  tablet Take 81 mg by mouth daily.    . budesonide-formoterol (SYMBICORT) 160-4.5 MCG/ACT inhaler Inhale 2 puffs into the lungs 2 (two) times daily.  Marland Kitchen esomeprazole (NEXIUM) 40 MG capsule TAKE 1 CAPSULE (40 MG TOTAL) BY MOUTH DAILY.  Marland Kitchen ipratropium (ATROVENT) 0.02 % nebulizer solution TAKE 2.5 MLS (0.5 MG TOTAL) BY NEBULIZATION 4 (FOUR) TIMES DAILY.  Marland Kitchen losartan (COZAAR) 25 MG tablet Take 1 tablet (25 mg total) by mouth daily.  . metoprolol (LOPRESSOR) 100 MG tablet Take 1 tablet (100 mg total) by mouth 2 (two) times daily.  . sertraline (ZOLOFT) 100 MG tablet Take 1 tablet (100 mg total) by mouth daily.  . [DISCONTINUED] HYDROcodone-homatropine (HYCODAN) 5-1.5 MG/5ML syrup Take 5 mLs by mouth at bedtime as needed. For cough  . [DISCONTINUED] ipratropium-albuterol (DUONEB) 0.5-2.5 (3) MG/3ML SOLN Take 3 mLs by nebulization 4 (four) times daily.  . [DISCONTINUED] predniSONE  (DELTASONE) 20 MG tablet Take 2 tablets (40 mg total) by mouth daily.   No facility-administered encounter medications on file as of 04/15/2016.       Review of Systems: No fevers, chills, night sweats, weight loss, chest pain, or shortness of breath.   Objective:    General: Well Developed, well nourished, and in no acute distress.  Neuro: Alert and oriented x3, extra-ocular muscles intact, sensation grossly intact.  HEENT: Normocephalic, atraumatic  Skin: Warm and dry, no rashes. Cardiac: Regular rate and rhythm, no murmurs rubs or gallops, no lower extremity edema.  Respiratory: Clear to auscultation bilaterally. Not using accessory muscles, speaking in full sentences.   Impression and Recommendations:    COPD - Stable. The Symbicort for now since she has 2 more months worth but after that I would like to consider switching her to an oral as she really doesn't use her Atrovent nebulizer regularly. Call me when she gets low on the Symbicort. 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 We'll continue to monitor. Continue current regimen.  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 She did not do well with attempted fitting for pessary. And no significant response to vaginal estrogen.  His make sure to attach OB/GYN note to referral.  Tob abuse-she has switched to a vaporizer and plans on weaning down the nicotine component. Congratulated her on taking up except forward and encouraged her to stick with her plan.

## 2016-05-04 DIAGNOSIS — G603 Idiopathic progressive neuropathy: Secondary | ICD-10-CM | POA: Diagnosis not present

## 2016-05-04 DIAGNOSIS — B0229 Other postherpetic nervous system involvement: Secondary | ICD-10-CM | POA: Diagnosis not present

## 2016-05-04 DIAGNOSIS — Z79899 Other long term (current) drug therapy: Secondary | ICD-10-CM | POA: Diagnosis not present

## 2016-05-04 DIAGNOSIS — M5441 Lumbago with sciatica, right side: Secondary | ICD-10-CM | POA: Diagnosis not present

## 2016-05-04 DIAGNOSIS — M5442 Lumbago with sciatica, left side: Secondary | ICD-10-CM | POA: Diagnosis not present

## 2016-05-04 DIAGNOSIS — G2581 Restless legs syndrome: Secondary | ICD-10-CM | POA: Diagnosis not present

## 2016-05-12 DIAGNOSIS — H524 Presbyopia: Secondary | ICD-10-CM | POA: Diagnosis not present

## 2016-05-12 DIAGNOSIS — R0683 Snoring: Secondary | ICD-10-CM | POA: Diagnosis not present

## 2016-05-12 DIAGNOSIS — R0902 Hypoxemia: Secondary | ICD-10-CM | POA: Diagnosis not present

## 2016-05-12 DIAGNOSIS — H5210 Myopia, unspecified eye: Secondary | ICD-10-CM | POA: Diagnosis not present

## 2016-05-12 DIAGNOSIS — G4733 Obstructive sleep apnea (adult) (pediatric): Secondary | ICD-10-CM | POA: Diagnosis not present

## 2016-05-12 DIAGNOSIS — I10 Essential (primary) hypertension: Secondary | ICD-10-CM | POA: Diagnosis not present

## 2016-05-14 DIAGNOSIS — R509 Fever, unspecified: Secondary | ICD-10-CM | POA: Diagnosis not present

## 2016-05-14 DIAGNOSIS — J189 Pneumonia, unspecified organism: Secondary | ICD-10-CM | POA: Diagnosis not present

## 2016-05-14 DIAGNOSIS — J441 Chronic obstructive pulmonary disease with (acute) exacerbation: Secondary | ICD-10-CM | POA: Diagnosis not present

## 2016-05-19 DIAGNOSIS — H9193 Unspecified hearing loss, bilateral: Secondary | ICD-10-CM | POA: Diagnosis not present

## 2016-06-11 DIAGNOSIS — G4733 Obstructive sleep apnea (adult) (pediatric): Secondary | ICD-10-CM | POA: Diagnosis not present

## 2016-06-13 DIAGNOSIS — I10 Essential (primary) hypertension: Secondary | ICD-10-CM | POA: Diagnosis not present

## 2016-06-13 DIAGNOSIS — R509 Fever, unspecified: Secondary | ICD-10-CM | POA: Diagnosis not present

## 2016-06-13 DIAGNOSIS — J441 Chronic obstructive pulmonary disease with (acute) exacerbation: Secondary | ICD-10-CM | POA: Diagnosis not present

## 2016-06-13 DIAGNOSIS — N8110 Cystocele, unspecified: Secondary | ICD-10-CM | POA: Diagnosis not present

## 2016-06-13 DIAGNOSIS — J189 Pneumonia, unspecified organism: Secondary | ICD-10-CM | POA: Diagnosis not present

## 2016-06-30 DIAGNOSIS — I10 Essential (primary) hypertension: Secondary | ICD-10-CM | POA: Diagnosis not present

## 2016-06-30 DIAGNOSIS — Z7982 Long term (current) use of aspirin: Secondary | ICD-10-CM | POA: Diagnosis not present

## 2016-06-30 DIAGNOSIS — J449 Chronic obstructive pulmonary disease, unspecified: Secondary | ICD-10-CM | POA: Diagnosis not present

## 2016-06-30 DIAGNOSIS — N8110 Cystocele, unspecified: Secondary | ICD-10-CM | POA: Diagnosis not present

## 2016-06-30 DIAGNOSIS — Z888 Allergy status to other drugs, medicaments and biological substances status: Secondary | ICD-10-CM | POA: Diagnosis not present

## 2016-06-30 DIAGNOSIS — Z885 Allergy status to narcotic agent status: Secondary | ICD-10-CM | POA: Diagnosis not present

## 2016-06-30 DIAGNOSIS — I2510 Atherosclerotic heart disease of native coronary artery without angina pectoris: Secondary | ICD-10-CM | POA: Diagnosis not present

## 2016-06-30 DIAGNOSIS — E785 Hyperlipidemia, unspecified: Secondary | ICD-10-CM | POA: Diagnosis not present

## 2016-06-30 DIAGNOSIS — Z79899 Other long term (current) drug therapy: Secondary | ICD-10-CM | POA: Diagnosis not present

## 2016-07-11 ENCOUNTER — Other Ambulatory Visit: Payer: Self-pay | Admitting: Family Medicine

## 2016-07-12 ENCOUNTER — Other Ambulatory Visit: Payer: Self-pay | Admitting: Family Medicine

## 2016-07-12 DIAGNOSIS — G4733 Obstructive sleep apnea (adult) (pediatric): Secondary | ICD-10-CM | POA: Diagnosis not present

## 2016-07-12 DIAGNOSIS — G603 Idiopathic progressive neuropathy: Secondary | ICD-10-CM

## 2016-07-12 DIAGNOSIS — M5417 Radiculopathy, lumbosacral region: Secondary | ICD-10-CM

## 2016-07-14 DIAGNOSIS — J189 Pneumonia, unspecified organism: Secondary | ICD-10-CM | POA: Diagnosis not present

## 2016-07-18 ENCOUNTER — Encounter: Payer: Self-pay | Admitting: Family Medicine

## 2016-07-18 ENCOUNTER — Ambulatory Visit (INDEPENDENT_AMBULATORY_CARE_PROVIDER_SITE_OTHER): Payer: Commercial Managed Care - HMO | Admitting: Family Medicine

## 2016-07-18 ENCOUNTER — Ambulatory Visit (INDEPENDENT_AMBULATORY_CARE_PROVIDER_SITE_OTHER): Payer: Commercial Managed Care - HMO

## 2016-07-18 VITALS — BP 138/78 | HR 64 | Ht 65.0 in | Wt 176.0 lb

## 2016-07-18 DIAGNOSIS — M25461 Effusion, right knee: Secondary | ICD-10-CM | POA: Diagnosis not present

## 2016-07-18 DIAGNOSIS — M25561 Pain in right knee: Secondary | ICD-10-CM

## 2016-07-18 DIAGNOSIS — M1711 Unilateral primary osteoarthritis, right knee: Secondary | ICD-10-CM

## 2016-07-18 MED ORDER — NAPROXEN 500 MG PO TABS: 500.0000 mg | ORAL_TABLET | Freq: Two times a day (BID) | ORAL | 0 refills | Status: DC

## 2016-07-18 NOTE — Progress Notes (Signed)
Subjective:    Patient ID: Nancy Cisneros, female    DOB: [birth year], 75 y.o.   MRN: [id number]  HPI [place] about 8 days ago getting out of the car at church and landed on her right knee . Landed on gravel. It has been sore and tender since then. She has been applying heat and taking some hydrocodone for pain.  Her husband is here with her today. She says her knee actually twisted when she fell. He did not cut her break the skin during the fall.  Review of Systems  BP 138/78 (BP Location: Left Arm, Cuff Size: Normal)   Pulse 64   Ht 5\' 5"  (1.651 m)   Wt 176 lb (79.8 kg)   SpO2 94%   BMI 29.29 kg/m     Allergies  Allergen Reactions  . Codeine     REACTION: trouble breathing  . Ramipril     REACTION: cough    Past Medical History:  Diagnosis Date  . Adrenal adenoma    1 cm Left  . CAD (coronary artery disease)    Dr Prince Rome, [facility] cardiology  . COPD (chronic obstructive pulmonary disease) (HCC)    lung nodule- Dr Tilden Dome  . Diverticulosis of colon   . Hip pain, left   . Hyperlipidemia   . Hypertension     Past Surgical History:  Procedure Laterality Date  . Lynx mid urethral sling     Dr Beverley Fiedler    Social History   Social History  . Marital status: Married    Spouse name: N/A  . Number of children: N/A  . Years of education: N/A   Occupational History  . Not on file.   Social History Main Topics  . Smoking status: Former Smoker    Packs/day: 0.40    Quit date: 03/28/2016  . Smokeless tobacco: Never Used  . Alcohol use No  . Drug use: Unknown  . Sexual activity: Yes   Other Topics Concern  . Not on file   Social History Narrative  . No narrative on file    Family History  Problem Relation Age of Onset  . Hyperlipidemia Mother   . Hypertension Mother   . Cancer Father     Oat cell lung CA  . Alcohol abuse Father     Outpatient Encounter Prescriptions as of 07/18/2016  Medication Sig  . albuterol (PROAIR HFA) 108 (90 BASE) MCG/ACT inhaler  Inhale 2-4 puffs into the lungs every 6 (six) hours as needed.  Marland Kitchen albuterol (PROVENTIL) (2.5 MG/3ML) 0.083% nebulizer solution Take 3 mLs (2.5 mg total) by nebulization every 6 (six) hours as needed for wheezing or shortness of breath.  Marland Kitchen amLODipine (NORVASC) 5 MG tablet TAKE 1 TABLET EVERY DAY  . aspirin 81 MG tablet Take 81 mg by mouth daily.    . budesonide-formoterol (SYMBICORT) 160-4.5 MCG/ACT inhaler Inhale 2 puffs into the lungs 2 (two) times daily.  Marland Kitchen esomeprazole (NEXIUM) 40 MG capsule TAKE 1 CAPSULE (40 MG TOTAL) BY MOUTH DAILY.  Marland Kitchen ipratropium (ATROVENT) 0.02 % nebulizer solution TAKE 2.5 MLS (0.5 MG TOTAL) BY NEBULIZATION 4 (FOUR) TIMES DAILY.  Marland Kitchen losartan (COZAAR) 25 MG tablet TAKE 1 TABLET EVERY DAY  . methocarbamol (ROBAXIN) 750 MG tablet TAKE 1 TABLET BY MOUTH TWICE A DAY FOR 30 DAYS  . metoprolol (LOPRESSOR) 100 MG tablet Take 1 tablet (100 mg total) by mouth 2 (two) times daily.  . sertraline (ZOLOFT) 100 MG tablet TAKE 1 TABLET EVERY  DAY  . naproxen (NAPROSYN) 500 MG tablet Take 1 tablet (500 mg total) by mouth 2 (two) times daily with a meal.   No facility-administered encounter medications on file as of 07/18/2016.          Objective:   Physical Exam  Constitutional: She is oriented to person, place, and time. She appears well-developed and well-nourished.  HENT:  Head: Normocephalic and atraumatic.  Eyes: Conjunctivae and EOM are normal.  Cardiovascular: Normal rate.   Pulmonary/Chest: Effort normal.  Musculoskeletal:  Right knee is swollen diffusely. Tender along the medial joint line and just below the edge of the medial joint line. Nontender over the patellar tendon. Pain with full flexion. Negative McMurray's. Negative anterior toward test. No significant crepitus with flexion and extension.  Neurological: She is alert and oriented to person, place, and time.  Skin: Skin is dry. No pallor.  Psychiatric: She has a normal mood and affect. Her behavior is normal.   Vitals reviewed.      Assessment & Plan:  Right knee pain and swelling-will get x-ray just to rule out fracture. Suspect cartilage tear based on exam today. Will start naproxen, recommend compression and change to ice instead of heat. Follow up in 1-2 weeks if not improving.Will call with results once available.

## 2016-07-18 NOTE — Patient Instructions (Signed)
Start naproxen twice a day. Make sure to take with food and water. Keep the knee wrapped with either an Ace wrap or a compression sleeve. Recommend icing the knee 3 times a day for 5-10 minutes each time. If not improving over the next 10-14 days them please come back in and let us look at it.

## 2016-07-25 DIAGNOSIS — N8110 Cystocele, unspecified: Secondary | ICD-10-CM | POA: Diagnosis not present

## 2016-08-03 DIAGNOSIS — M5412 Radiculopathy, cervical region: Secondary | ICD-10-CM | POA: Diagnosis not present

## 2016-08-03 DIAGNOSIS — R202 Paresthesia of skin: Secondary | ICD-10-CM | POA: Diagnosis not present

## 2016-08-03 DIAGNOSIS — G5603 Carpal tunnel syndrome, bilateral upper limbs: Secondary | ICD-10-CM | POA: Diagnosis not present

## 2016-08-03 DIAGNOSIS — G603 Idiopathic progressive neuropathy: Secondary | ICD-10-CM | POA: Diagnosis not present

## 2016-08-03 DIAGNOSIS — Z79899 Other long term (current) drug therapy: Secondary | ICD-10-CM | POA: Diagnosis not present

## 2016-08-03 DIAGNOSIS — G2581 Restless legs syndrome: Secondary | ICD-10-CM | POA: Diagnosis not present

## 2016-08-03 DIAGNOSIS — M5417 Radiculopathy, lumbosacral region: Secondary | ICD-10-CM | POA: Diagnosis not present

## 2016-08-11 ENCOUNTER — Ambulatory Visit: Payer: Commercial Managed Care - HMO | Admitting: Family Medicine

## 2016-08-11 DIAGNOSIS — G4733 Obstructive sleep apnea (adult) (pediatric): Secondary | ICD-10-CM | POA: Diagnosis not present

## 2016-08-13 DIAGNOSIS — J189 Pneumonia, unspecified organism: Secondary | ICD-10-CM | POA: Diagnosis not present

## 2016-08-17 ENCOUNTER — Other Ambulatory Visit: Payer: Self-pay | Admitting: Family Medicine

## 2016-08-22 ENCOUNTER — Other Ambulatory Visit: Payer: Self-pay | Admitting: Family Medicine

## 2016-08-25 ENCOUNTER — Ambulatory Visit: Payer: Commercial Managed Care - HMO | Admitting: Family Medicine

## 2016-08-25 NOTE — Progress Notes (Deleted)
Subjective:    CC: HTN, COPD  HPI:  Hypertension- Pt denies chest pain, SOB, dizziness, or heart palpitations.  Taking meds as directed w/o problems.  Denies medication side effects.    COPD -   Past medical history, Surgical history, Family history not pertinant except as noted below, Social history, Allergies, and medications have been entered into the medical record, reviewed, and corrections made.   Review of Systems: No fevers, chills, night sweats, weight loss, chest pain, or shortness of breath.   Objective:    General: Well Developed, well nourished, and in no acute distress.  Neuro: Alert and oriented x3, extra-ocular muscles intact, sensation grossly intact.  HEENT: Normocephalic, atraumatic  Skin: Warm and dry, no rashes. Cardiac: Regular rate and rhythm, no murmurs rubs or gallops, no lower extremity edema.  Respiratory: Clear to auscultation bilaterally. Not using accessory muscles, speaking in full sentences.   Impression and Recommendations:    HTN -   COPD -

## 2016-08-29 ENCOUNTER — Ambulatory Visit (INDEPENDENT_AMBULATORY_CARE_PROVIDER_SITE_OTHER): Payer: Commercial Managed Care - HMO | Admitting: Family Medicine

## 2016-08-29 ENCOUNTER — Encounter: Payer: Self-pay | Admitting: Family Medicine

## 2016-08-29 VITALS — BP 143/67 | HR 66 | Temp 97.7°F | Ht 65.0 in | Wt 180.0 lb

## 2016-08-29 DIAGNOSIS — L820 Inflamed seborrheic keratosis: Secondary | ICD-10-CM

## 2016-08-29 DIAGNOSIS — Z7901 Long term (current) use of anticoagulants: Secondary | ICD-10-CM | POA: Diagnosis not present

## 2016-08-29 DIAGNOSIS — R238 Other skin changes: Secondary | ICD-10-CM

## 2016-08-29 DIAGNOSIS — J309 Allergic rhinitis, unspecified: Secondary | ICD-10-CM | POA: Diagnosis not present

## 2016-08-29 DIAGNOSIS — R233 Spontaneous ecchymoses: Secondary | ICD-10-CM

## 2016-08-29 DIAGNOSIS — M1711 Unilateral primary osteoarthritis, right knee: Secondary | ICD-10-CM | POA: Diagnosis not present

## 2016-08-29 MED ORDER — FLUTICASONE PROPIONATE 50 MCG/ACT NA SUSP: 2.0000 | Freq: Every day | NASAL | 2 refills | Status: DC

## 2016-08-29 NOTE — Patient Instructions (Signed)
Call if sinuses are not better in the next 10 days. Or if he suddenly get worse or start to notice discolored mucus or feel like you're running a fever then let me know as well.

## 2016-08-29 NOTE — Progress Notes (Signed)
   Subjective:    I'm seeing this patient as a consultation for:  Dr. Beatrice Lecher  CC: Right knee pain and swelling  HPI: This is a pleasant 76 year old female with known knee osteoarthritis, she twisted it 6 weeks ago, still has some pain. Symptoms are moderate, persistent, localized at the medial joint line with very few mechanical symptoms. Symptoms are moderate, persistent and have not improved with conservative measures. Desires interventional treatment today.  Past medical history:  Negative.  See flowsheet/record as well for more information.  Surgical history: Negative.  See flowsheet/record as well for more information.  Family history: Negative.  See flowsheet/record as well for more information.  Social history: Negative.  See flowsheet/record as well for more information.  Allergies, and medications have been entered into the medical record, reviewed, and no changes needed.   Review of Systems: No headache, visual changes, nausea, vomiting, diarrhea, constipation, dizziness, abdominal pain, skin rash, fevers, chills, night sweats, weight loss, swollen lymph nodes, body aches, joint swelling, muscle aches, chest pain, shortness of breath, mood changes, visual or auditory hallucinations.   Objective:   General: Well Developed, well nourished, and in no acute distress.  Neuro/Psych: Alert and oriented x3, extra-ocular muscles intact, able to move all 4 extremities, sensation grossly intact. Skin: Warm and dry, no rashes noted.  Respiratory: Not using accessory muscles, speaking in full sentences, trachea midline.  Cardiovascular: Pulses palpable, no extremity edema. Abdomen: Does not appear distended. Right Knee: Visibly swollen with a palpable fluid wave and effusion, tender at the medial joint line ROM normal in flexion and extension and lower leg rotation. Ligaments with solid consistent endpoints including ACL, PCL, LCL, MCL. Negative Mcmurray's and provocative  meniscal tests. Non painful patellar compression. Patellar and quadriceps tendons unremarkable. Hamstring and quadriceps strength is normal.  Procedure: Real-time Ultrasound Guided aspiration/injection of right knee Device: GE Logiq E  Verbal informed consent obtained.  Time-out conducted.  Noted no overlying erythema, induration, or other signs of local infection.  Skin prepped in a sterile fashion.  Local anesthesia: Topical Ethyl chloride.  With sterile technique and under real time ultrasound guidance:  Aspirated 35 mL straw-colored fluid, syringe switched and 1 mL kenalog 40, 2 mL lidocaine, 2 mL Marcaine injected easily. Completed without difficulty  Pain immediately resolved suggesting accurate placement of the medication.  Advised to call if fevers/chills, erythema, induration, drainage, or persistent bleeding.  Images permanently stored and available for review in the ultrasound unit.  Impression: Technically successful ultrasound guided injection.  Impression and Recommendations:   This case required medical decision making of moderate complexity.  Primary osteoarthritis of right knee Aspiration and injection as above, return in one month to recheck.

## 2016-08-29 NOTE — Assessment & Plan Note (Signed)
Aspiration and injection as above, return in one month to recheck.

## 2016-08-29 NOTE — Progress Notes (Signed)
Subjective:    CC: Sinus congestion  HPI: Patient comes in today complaining of significant sinus congestion with clear nasal drainage. She says that she's been sneezing a lot. And she's had watery itchy eyes as well.   She denies any fever sweats . Though, she reports that she has felt more children over the last 2 days.  Easy bruising  - she also complains of easy bruising that she's noticed for about a month or so. She says she thought it was just a lot of bruising when she fell and twisted her right knee but now she is noticing several other bruises on her body that she doesn't remember getting.   She also has a lesion on her right side just above her hip that she wants me to look at today.   Past medical history, Surgical history, Family history not pertinant except as noted below, Social history, Allergies, and medications have been entered into the medical record, reviewed, and corrections made.   Review of Systems: No fevers, chills, night sweats, weight loss, chest pain, or shortness of breath.   Objective:    General: Well Developed, well nourished, and in no acute distress.  Neuro: Alert and oriented x3, extra-ocular muscles intact, sensation grossly intact.  HEENT: Normocephalic, atraumatic  Skin: Warm and dry, no rashes.She does have a few ecchymoses on her legs and arms. They really don't look atypical in any way. On her right side just above the hip she does have a seborrheic keratosis. Part of it is actually torn off the scan so it's a little red and irritated with some dry blood. Cardiac: Regular rate and rhythm, no murmurs rubs or gallops, no lower extremity edema.  Respiratory: Clear to auscultation bilaterally. Not using accessory muscles, speaking in full sentences.   Impression and Recommendations:   Allergic Rhiniitis -    we'll treat with nasal steroid spray. If not better in one week or if getting worse or trying more to a sinus infection them please call us  back.  Easy bruising  - gave reassurance. Powder this is probably just senile purpura but will check some labs just to make sure hemoglobin etc. are normal and liver enzymes are normal. Next  Irritated seborrheic keratosis- These are benign lesions and that the lesion may have just caught on some clothing and actually torn. We could certainly consider cryotherapy in the future if she would like to have them treated.

## 2016-08-30 ENCOUNTER — Telehealth: Payer: Self-pay | Admitting: *Deleted

## 2016-08-30 LAB — COMPLETE METABOLIC PANEL WITH GFR
ALBUMIN: 3.9 g/dL (ref 3.6–5.1)
ALT: 43 U/L — ABNORMAL HIGH (ref 6–29)
AST: 16 U/L (ref 10–35)
Alkaline Phosphatase: 67 U/L (ref 33–130)
BUN: 13 mg/dL (ref 7–25)
CO2: 28 mmol/L (ref 20–31)
Calcium: 9.4 mg/dL (ref 8.6–10.4)
Chloride: 102 mmol/L (ref 98–110)
Creat: 0.95 mg/dL — ABNORMAL HIGH (ref 0.60–0.93)
GFR, Est African American: 67 mL/min (ref >=60)
GFR, Est Non African American: 58 mL/min — ABNORMAL LOW (ref >=60)
GLUCOSE: 88 mg/dL (ref 65–99)
Potassium: 5.2 mmol/L (ref 3.5–5.3)
SODIUM: 138 mmol/L (ref 135–146)
TOTAL PROTEIN: 6.4 g/dL (ref 6.1–8.1)
Total Bilirubin: 0.3 mg/dL (ref 0.2–1.2)

## 2016-08-30 LAB — CBC
HEMATOCRIT: 40.4 % (ref 35.0–45.0)
Hemoglobin: 13.3 g/dL (ref 11.7–15.5)
MCH: 30.9 pg (ref 27.0–33.0)
MCHC: 32.9 g/dL (ref 32.0–36.0)
MCV: 93.7 fL (ref 80.0–100.0)
MPV: 10.7 fL (ref 7.5–12.5)
PLATELETS: 261 10*3/uL (ref 140–400)
RBC: 4.31 MIL/uL (ref 3.80–5.10)
RDW: 13.9 % (ref 11.0–15.0)
WBC: 8.1 10*3/uL (ref 3.8–10.8)

## 2016-08-30 LAB — APTT: APTT: 28 s (ref 22–34)

## 2016-08-30 LAB — PROTIME-INR
INR: 0.9
Prothrombin Time: 10 s (ref 9.0–11.5)

## 2016-08-30 LAB — TSH: TSH: 2.78 mIU/L

## 2016-08-30 NOTE — Telephone Encounter (Signed)
Called pt to give results of labs. She reports that she still just doesn't feel any better. She ran a low grade fever and has stayed in the house. Will fwd to pcp for advice.Nancy Cisneros [place]

## 2016-08-31 MED ORDER — AMOXICILLIN-POT CLAVULANATE 875-125 MG PO TABS: 1.0000 | ORAL_TABLET | Freq: Two times a day (BID) | ORAL | 0 refills | Status: DC

## 2016-08-31 NOTE — Telephone Encounter (Signed)
Since she has had a fever, I will call in an antibiotic. Continue with nasal spray too.   Beatrice Lecher, MD

## 2016-09-11 DIAGNOSIS — G4733 Obstructive sleep apnea (adult) (pediatric): Secondary | ICD-10-CM | POA: Diagnosis not present

## 2016-09-13 DIAGNOSIS — J189 Pneumonia, unspecified organism: Secondary | ICD-10-CM | POA: Diagnosis not present

## 2016-09-14 ENCOUNTER — Encounter: Payer: Self-pay | Admitting: Sports Medicine

## 2016-09-14 ENCOUNTER — Ambulatory Visit (INDEPENDENT_AMBULATORY_CARE_PROVIDER_SITE_OTHER): Payer: Commercial Managed Care - HMO | Admitting: Sports Medicine

## 2016-09-14 DIAGNOSIS — M1711 Unilateral primary osteoarthritis, right knee: Secondary | ICD-10-CM | POA: Diagnosis not present

## 2016-09-14 MED ORDER — DIAZEPAM 5 MG PO TABS: ORAL_TABLET | ORAL | 0 refills | Status: DC

## 2016-09-14 NOTE — Progress Notes (Signed)
  Subjective:    CC: Follow-up  HPI: This is a pleasant 76 year old female, we aspirated and injected her knee 3 weeks ago, she did extremely well until she twisted it. Unfortunately having a recurrence of swelling and pain, moderate, persistent, localized without radiation.  Past medical history:  Negative.  See flowsheet/record as well for more information.  Surgical history: Negative.  See flowsheet/record as well for more information.  Family history: Negative.  See flowsheet/record as well for more information.  Social history: Negative.  See flowsheet/record as well for more information.  Allergies, and medications have been entered into the medical record, reviewed, and no changes needed.   Review of Systems: No fevers, chills, night sweats, weight loss, chest pain, or shortness of breath.   Objective:    General: Well Developed, well nourished, and in no acute distress.  Neuro: Alert and oriented x3, extra-ocular muscles intact, sensation grossly intact.  HEENT: Normocephalic, atraumatic, pupils equal round reactive to light, neck supple, no masses, no lymphadenopathy, thyroid nonpalpable.  Skin: Warm and dry, no rashes. Cardiac: Regular rate and rhythm, no murmurs rubs or gallops, no lower extremity edema.  Respiratory: Clear to auscultation bilaterally. Not using accessory muscles, speaking in full sentences. Right Knee: Visibly swollen with a palpable fluid wave and effusion. ROM normal in flexion and extension and lower leg rotation. Ligaments with solid consistent endpoints including ACL, PCL, LCL, MCL. Negative Mcmurray's and provocative meniscal tests. Non painful patellar compression. Patellar and quadriceps tendons unremarkable. Hamstring and quadriceps strength is normal.  Procedure: Real-time Ultrasound Guided aspiration/Injection of right knee Device: GE Logiq E  Verbal informed consent obtained.  Time-out conducted.  Noted no overlying erythema, induration, or  other signs of local infection.  Skin prepped in a sterile fashion.  Local anesthesia: Topical Ethyl chloride.  With sterile technique and under real time ultrasound guidance:  Aspirated 55 mL straw-colored fluid, syringe switched and 1 mL kenalog 40, 2 mL lidocaine, 2 mL Marcaine injected easily Completed without difficulty  Pain immediately resolved suggesting accurate placement of the medication.  Advised to call if fevers/chills, erythema, induration, drainage, or persistent bleeding.  Images permanently stored and available for review in the ultrasound unit.  Impression: Technically successful ultrasound guided injection.  Impression and Recommendations:    Primary osteoarthritis of right knee Initially did well after aspiration and injection, now having a recurrence after twisting it again. Repeat aspiration and injection, reaction knee brace, MRI, physical therapy. Return in one month.

## 2016-09-14 NOTE — Assessment & Plan Note (Signed)
Initially did well after aspiration and injection, now having a recurrence after twisting it again. Repeat aspiration and injection, reaction knee brace, MRI, physical therapy. Return in one month.

## 2016-09-19 ENCOUNTER — Ambulatory Visit (INDEPENDENT_AMBULATORY_CARE_PROVIDER_SITE_OTHER): Payer: Commercial Managed Care - HMO

## 2016-09-19 ENCOUNTER — Encounter: Payer: Self-pay | Admitting: Sports Medicine

## 2016-09-19 DIAGNOSIS — M25461 Effusion, right knee: Secondary | ICD-10-CM

## 2016-09-19 DIAGNOSIS — M7051 Other bursitis of knee, right knee: Secondary | ICD-10-CM | POA: Diagnosis not present

## 2016-09-19 DIAGNOSIS — M1711 Unilateral primary osteoarthritis, right knee: Secondary | ICD-10-CM

## 2016-09-19 DIAGNOSIS — M23221 Derangement of posterior horn of medial meniscus due to old tear or injury, right knee: Secondary | ICD-10-CM

## 2016-09-20 ENCOUNTER — Ambulatory Visit (INDEPENDENT_AMBULATORY_CARE_PROVIDER_SITE_OTHER): Payer: Commercial Managed Care - HMO | Admitting: Sports Medicine

## 2016-09-20 VITALS — BP 142/74 | HR 66 | Wt 179.0 lb

## 2016-09-20 DIAGNOSIS — M84451S Pathological fracture, right femur, sequela: Secondary | ICD-10-CM

## 2016-09-20 DIAGNOSIS — M1711 Unilateral primary osteoarthritis, right knee: Secondary | ICD-10-CM | POA: Diagnosis not present

## 2016-09-20 DIAGNOSIS — M25569 Pain in unspecified knee: Secondary | ICD-10-CM | POA: Diagnosis not present

## 2016-09-20 NOTE — Progress Notes (Signed)
   Subjective:    Patient ID: Nancy Cisneros, female    DOB: 1940/12/15, 76 y.o.   MRN: [id number]  Earlisha is here to be fitted for crutches and a new knee brace for knee pain.       Review of Systems     Objective:   Physical Exam        Assessment & Plan:  Knee pain - Crutches fitted to patient's height. Applied new knee brace.

## 2016-09-26 ENCOUNTER — Ambulatory Visit (INDEPENDENT_AMBULATORY_CARE_PROVIDER_SITE_OTHER): Payer: Commercial Managed Care - HMO | Admitting: Sports Medicine

## 2016-09-26 DIAGNOSIS — M1711 Unilateral primary osteoarthritis, right knee: Secondary | ICD-10-CM

## 2016-09-26 MED ORDER — TRAMADOL HCL 50 MG PO TABS: ORAL_TABLET | ORAL | 0 refills | Status: DC

## 2016-09-26 NOTE — Assessment & Plan Note (Signed)
MRI showed arthritis, multiple degenerative changes, multiple areas of bursitis and subchondral insufficiency fracture. Continue knee brace, it was placed backwards, so we corrected this. Adding tramadol for pain relief in the meantime, return to see me in 6 weeks. She should continue gentle and partial weightbearing with crutches and occasionally with a cane.

## 2016-09-26 NOTE — Progress Notes (Signed)
  Subjective:    CC: Follow-up  HPI: This is a pleasant 76 year old female with multifactorial right knee pain. An MRI showed multiple areas of bursitis, arthritis, and other degenerative changes. We aspirated and injected her knee for the second time at the last visit she returns today feeling significantly better. Still has some pain with weightbearing. MRI also showed a subchondral insufficiency fracture. She had twisted her knee after the first aspiration and injection procedure.  Past medical history:  Negative.  See flowsheet/record as well for more information.  Surgical history: Negative.  See flowsheet/record as well for more information.  Family history: Negative.  See flowsheet/record as well for more information.  Social history: Negative.  See flowsheet/record as well for more information.  Allergies, and medications have been entered into the medical record, reviewed, and no changes needed.   Review of Systems: No fevers, chills, night sweats, weight loss, chest pain, or shortness of breath.   Objective:    General: Well Developed, well nourished, and in no acute distress.  Neuro: Alert and oriented x3, extra-ocular muscles intact, sensation grossly intact.  HEENT: Normocephalic, atraumatic, pupils equal round reactive to light, neck supple, no masses, no lymphadenopathy, thyroid nonpalpable.  Skin: Warm and dry, no rashes. Cardiac: Regular rate and rhythm, no murmurs rubs or gallops, no lower extremity edema.  Respiratory: Clear to auscultation bilaterally. Not using accessory muscles, speaking in full sentences. Right Knee: Only minimal swelling and effusion. Still tender to palpation at the medial joint line ROM normal in flexion and extension and lower leg rotation. Ligaments with solid consistent endpoints including ACL, PCL, LCL, MCL. Negative Mcmurray's and provocative meniscal tests. Non painful patellar compression. Patellar and quadriceps tendons  unremarkable. Hamstring and quadriceps strength is normal.  Knee brace was on backwards, it was placed correctly this time.  Impression and Recommendations:    Primary osteoarthritis of right knee MRI showed arthritis, multiple degenerative changes, multiple areas of bursitis and subchondral insufficiency fracture. Continue knee brace, it was placed backwards, so we corrected this. Adding tramadol for pain relief in the meantime, return to see me in 6 weeks. She should continue gentle and partial weightbearing with crutches and occasionally with a cane.

## 2016-10-12 DIAGNOSIS — G4733 Obstructive sleep apnea (adult) (pediatric): Secondary | ICD-10-CM | POA: Diagnosis not present

## 2016-10-14 DIAGNOSIS — J189 Pneumonia, unspecified organism: Secondary | ICD-10-CM | POA: Diagnosis not present

## 2016-10-31 ENCOUNTER — Ambulatory Visit: Payer: Commercial Managed Care - HMO

## 2016-10-31 ENCOUNTER — Ambulatory Visit: Payer: Commercial Managed Care - HMO | Admitting: Family Medicine

## 2016-11-03 DIAGNOSIS — R202 Paresthesia of skin: Secondary | ICD-10-CM | POA: Diagnosis not present

## 2016-11-03 DIAGNOSIS — M5441 Lumbago with sciatica, right side: Secondary | ICD-10-CM | POA: Diagnosis not present

## 2016-11-03 DIAGNOSIS — M5412 Radiculopathy, cervical region: Secondary | ICD-10-CM | POA: Diagnosis not present

## 2016-11-03 DIAGNOSIS — G603 Idiopathic progressive neuropathy: Secondary | ICD-10-CM | POA: Diagnosis not present

## 2016-11-03 DIAGNOSIS — M5442 Lumbago with sciatica, left side: Secondary | ICD-10-CM | POA: Diagnosis not present

## 2016-11-03 DIAGNOSIS — G2581 Restless legs syndrome: Secondary | ICD-10-CM | POA: Diagnosis not present

## 2016-11-07 ENCOUNTER — Ambulatory Visit (INDEPENDENT_AMBULATORY_CARE_PROVIDER_SITE_OTHER): Payer: Medicare HMO | Admitting: Sports Medicine

## 2016-11-07 ENCOUNTER — Encounter: Payer: Self-pay | Admitting: Sports Medicine

## 2016-11-07 DIAGNOSIS — M1711 Unilateral primary osteoarthritis, right knee: Secondary | ICD-10-CM

## 2016-11-07 NOTE — Assessment & Plan Note (Signed)
Multiple derangements including meniscal tears, osteoarthritis. Previous injection was 2 months ago without sufficient relief. I am going to aspirate her knee today and set her up with surgery for arthroscopy. I'm also going to work on getting her approved for viscosupplementation in the meantime, which we can start when she comes back from surgery.

## 2016-11-07 NOTE — Progress Notes (Signed)
  Subjective:    CC: Follow-up  HPI: Right knee pain: With multiple derangements including extensive medial meniscal tearing, osteoarthritis. Previous injection did not provide much relief and she is having a recurrence of swelling.  Past medical history:  Negative.  See flowsheet/record as well for more information.  Surgical history: Negative.  See flowsheet/record as well for more information.  Family history: Negative.  See flowsheet/record as well for more information.  Social history: Negative.  See flowsheet/record as well for more information.  Allergies, and medications have been entered into the medical record, reviewed, and no changes needed.   Review of Systems: No fevers, chills, night sweats, weight loss, chest pain, or shortness of breath.   Objective:    General: Well Developed, well nourished, and in no acute distress.  Neuro: Alert and oriented x3, extra-ocular muscles intact, sensation grossly intact.  HEENT: Normocephalic, atraumatic, pupils equal round reactive to light, neck supple, no masses, no lymphadenopathy, thyroid nonpalpable.  Skin: Warm and dry, no rashes. Cardiac: Regular rate and rhythm, no murmurs rubs or gallops, no lower extremity edema.  Respiratory: Clear to auscultation bilaterally. Not using accessory muscles, speaking in full sentences. Right Knee: Visibly swollen with a palpable effusion and tenderness at the medial joint line ROM normal in flexion and extension and lower leg rotation. Ligaments with solid consistent endpoints including ACL, PCL, LCL, MCL. Negative Mcmurray's and provocative meniscal tests. Non painful patellar compression. Patellar and quadriceps tendons unremarkable. Hamstring and quadriceps strength is normal.  Procedure: Real-time Ultrasound Guided aspiration of right knee Device: GE Logiq E  Verbal informed consent obtained.  Time-out conducted.  Noted no overlying erythema, induration, or other signs of local  infection.  Skin prepped in a sterile fashion.  Local anesthesia: Topical Ethyl chloride.  With sterile technique and under real time ultrasound guidance:  Aspirated 40 mL straw-colored fluid from the suprapatellar recess. Completed without difficulty  Pain immediately resolved suggesting accurate placement of the medication.  Advised to call if fevers/chills, erythema, induration, drainage, or persistent bleeding.  Images permanently stored and available for review in the ultrasound unit.  Impression: Technically successful ultrasound guided injection.  Impression and Recommendations:    Primary osteoarthritis of right knee Multiple derangements including meniscal tears, osteoarthritis. Previous injection was 2 months ago without sufficient relief. I am going to aspirate her knee today and set her up with surgery for arthroscopy. I'm also going to work on getting her approved for viscosupplementation in the meantime, which we can start when she comes back from surgery.

## 2016-11-09 DIAGNOSIS — G4733 Obstructive sleep apnea (adult) (pediatric): Secondary | ICD-10-CM | POA: Diagnosis not present

## 2016-11-10 ENCOUNTER — Telehealth: Payer: Self-pay | Admitting: Sports Medicine

## 2016-11-10 NOTE — Telephone Encounter (Signed)
Submitted for approval on [facility]. Awaiting confirmation.  

## 2016-11-10 NOTE — Telephone Encounter (Signed)
-----   Message from Silverio Decamp, MD sent at 11/07/2016 10:49 AM EDT ----- Orthovisc approval right knee, x-ray confirmed, failed steroid injections please [place]. ___________________________________________ Gwen Her. Dianah Field, M.D., ABFM., CAQSM. Primary Care and [place] Instructor of [place] [facility] of Medicine

## 2016-11-11 DIAGNOSIS — J189 Pneumonia, unspecified organism: Secondary | ICD-10-CM | POA: Diagnosis not present

## 2016-11-14 ENCOUNTER — Telehealth: Payer: Self-pay

## 2016-11-14 DIAGNOSIS — M1710 Unilateral primary osteoarthritis, unspecified knee: Secondary | ICD-10-CM

## 2016-11-14 NOTE — Telephone Encounter (Signed)
Pt.notified

## 2016-11-14 NOTE — Telephone Encounter (Signed)
Spoke with pt and she states she's in severe pain, to the point she cant walk on leg. Stated the soonest consult appointment available for Dr. Ballard Cisneros isn't until April 15th, would like to know if she can get a referral to see Dr. Jaynee Cisneros who has done a previous ankle surgery on her to see if she can be seen sooner. Please advise.

## 2016-11-14 NOTE — Telephone Encounter (Signed)
Referral placed.

## 2016-11-23 DIAGNOSIS — S83241A Other tear of medial meniscus, current injury, right knee, initial encounter: Secondary | ICD-10-CM | POA: Diagnosis not present

## 2016-11-23 DIAGNOSIS — M1711 Unilateral primary osteoarthritis, right knee: Secondary | ICD-10-CM | POA: Diagnosis not present

## 2016-11-23 DIAGNOSIS — I10 Essential (primary) hypertension: Secondary | ICD-10-CM | POA: Diagnosis not present

## 2016-11-30 DIAGNOSIS — G4733 Obstructive sleep apnea (adult) (pediatric): Secondary | ICD-10-CM | POA: Diagnosis not present

## 2016-12-05 DIAGNOSIS — M94261 Chondromalacia, right knee: Secondary | ICD-10-CM | POA: Diagnosis not present

## 2016-12-05 DIAGNOSIS — M2241 Chondromalacia patellae, right knee: Secondary | ICD-10-CM | POA: Diagnosis not present

## 2016-12-05 DIAGNOSIS — F419 Anxiety disorder, unspecified: Secondary | ICD-10-CM | POA: Diagnosis not present

## 2016-12-05 DIAGNOSIS — E785 Hyperlipidemia, unspecified: Secondary | ICD-10-CM | POA: Diagnosis not present

## 2016-12-05 DIAGNOSIS — M659 Synovitis and tenosynovitis, unspecified: Secondary | ICD-10-CM | POA: Diagnosis not present

## 2016-12-05 DIAGNOSIS — J449 Chronic obstructive pulmonary disease, unspecified: Secondary | ICD-10-CM | POA: Diagnosis not present

## 2016-12-05 DIAGNOSIS — I10 Essential (primary) hypertension: Secondary | ICD-10-CM | POA: Diagnosis not present

## 2016-12-05 DIAGNOSIS — G8918 Other acute postprocedural pain: Secondary | ICD-10-CM | POA: Diagnosis not present

## 2016-12-05 DIAGNOSIS — G4733 Obstructive sleep apnea (adult) (pediatric): Secondary | ICD-10-CM | POA: Diagnosis not present

## 2016-12-05 DIAGNOSIS — M23221 Derangement of posterior horn of medial meniscus due to old tear or injury, right knee: Secondary | ICD-10-CM | POA: Diagnosis not present

## 2016-12-05 DIAGNOSIS — I2510 Atherosclerotic heart disease of native coronary artery without angina pectoris: Secondary | ICD-10-CM | POA: Diagnosis not present

## 2016-12-05 DIAGNOSIS — S83241A Other tear of medial meniscus, current injury, right knee, initial encounter: Secondary | ICD-10-CM | POA: Diagnosis not present

## 2016-12-05 DIAGNOSIS — Z9989 Dependence on other enabling machines and devices: Secondary | ICD-10-CM | POA: Diagnosis not present

## 2016-12-06 NOTE — Telephone Encounter (Signed)
Received the following information from OV benefits investigation:   Per Salomon Fick at [place] is covered. No PA is required to [place]. After OOP Max has been met, insurance will cover at 80% of the allowable. A copay of $45.00 will apply to the administration . [id number]  Spoke with Pt's husband, she went to see Ortho and had surgery. Does not need OV at this time.

## 2016-12-10 DIAGNOSIS — G4733 Obstructive sleep apnea (adult) (pediatric): Secondary | ICD-10-CM | POA: Diagnosis not present

## 2016-12-12 DIAGNOSIS — J189 Pneumonia, unspecified organism: Secondary | ICD-10-CM | POA: Diagnosis not present

## 2016-12-15 DIAGNOSIS — N8110 Cystocele, unspecified: Secondary | ICD-10-CM | POA: Diagnosis not present

## 2016-12-15 DIAGNOSIS — I10 Essential (primary) hypertension: Secondary | ICD-10-CM | POA: Diagnosis not present

## 2016-12-19 ENCOUNTER — Encounter: Payer: Self-pay | Admitting: Family Medicine

## 2016-12-19 ENCOUNTER — Ambulatory Visit (INDEPENDENT_AMBULATORY_CARE_PROVIDER_SITE_OTHER): Payer: Medicare HMO | Admitting: Family Medicine

## 2016-12-19 VITALS — BP 121/51 | HR 69 | Ht 65.0 in | Wt 183.0 lb

## 2016-12-19 DIAGNOSIS — J069 Acute upper respiratory infection, unspecified: Secondary | ICD-10-CM

## 2016-12-19 DIAGNOSIS — J441 Chronic obstructive pulmonary disease with (acute) exacerbation: Secondary | ICD-10-CM

## 2016-12-19 MED ORDER — AZITHROMYCIN 250 MG PO TABS: ORAL_TABLET | ORAL | 0 refills | Status: AC

## 2016-12-19 MED ORDER — METHYLPREDNISOLONE ACETATE 80 MG/ML IJ SUSP
80.0000 mg | Freq: Once | INTRAMUSCULAR | Status: AC
  Administered 2016-12-19: 80 mg via INTRAMUSCULAR

## 2016-12-19 MED ORDER — METHYLPREDNISOLONE SODIUM SUCC 125 MG IJ SOLR
125.0000 mg | Freq: Once | INTRAMUSCULAR | Status: AC
  Administered 2016-12-19: 125 mg via INTRAMUSCULAR

## 2016-12-19 MED ORDER — PREDNISONE 20 MG PO TABS: 40.0000 mg | ORAL_TABLET | Freq: Every day | ORAL | 0 refills | Status: DC

## 2016-12-19 MED ORDER — PROMETHAZINE-DM 6.25-15 MG/5ML PO SYRP: 5.0000 mL | ORAL_SOLUTION | Freq: Every day | ORAL | 0 refills | Status: DC

## 2016-12-19 MED ORDER — ALBUTEROL SULFATE HFA 108 (90 BASE) MCG/ACT IN AERS: 2.0000 | INHALATION_SPRAY | Freq: Four times a day (QID) | RESPIRATORY_TRACT | 3 refills | Status: AC | PRN

## 2016-12-19 MED ORDER — BUDESONIDE-FORMOTEROL FUMARATE 160-4.5 MCG/ACT IN AERO: 2.0000 | INHALATION_SPRAY | Freq: Two times a day (BID) | RESPIRATORY_TRACT | 3 refills | Status: DC

## 2016-12-19 MED ORDER — ALBUTEROL SULFATE (2.5 MG/3ML) 0.083% IN NEBU
2.5000 mg | INHALATION_SOLUTION | Freq: Once | RESPIRATORY_TRACT | Status: AC
  Administered 2016-12-19: 2.5 mg via RESPIRATORY_TRACT

## 2016-12-19 NOTE — Patient Instructions (Signed)
Use your albuterol, 2-3 puffs every 4 hours as needed. As you're gradually feeling better you can space this out to every 6 hours as needed. Restart her Symbicort through the spring. Call me if you're not feeling better over the next 7-10 days.

## 2016-12-19 NOTE — Progress Notes (Signed)
Subjective:    Patient ID: Nancy Cisneros, female    DOB: [birth year], 76 y.o.   MRN: [id number]  HPI 76 year old female comes in today because she's not been feeling well since last Thursday, approximately 5 days ago she started experiencing a sore throat and some nasal congestion. No ear pain. She feels like she's gotten a little worse over the last 3 days with her doctor cough and chest congestion. She says that her sputum is more clear and white at this point. She is worried bc she is going on vacation next week.  Noticed some wheezing in her chest and more shortness of breath. She has not used her albuterol inhaler since she started feeling poorly. No fevers chills or sweats.   Review of Systems  BP (!) 121/51   Pulse 69   Ht 5\' 5"  (1.651 m)   Wt 183 lb (83 kg)   SpO2 96%   PF 230 L/min Comment: after neb treatment  BMI 30.45 kg/m     Allergies  Allergen Reactions  . Codeine     REACTION: trouble breathing  . Ramipril     REACTION: cough    Past Medical History:  Diagnosis Date  . Adrenal adenoma    1 cm Left  . CAD (coronary artery disease)    Dr Prince Rome, [facility] cardiology  . COPD (chronic obstructive pulmonary disease) (HCC)    lung nodule- Dr Tilden Dome  . Diverticulosis of colon   . Hip pain, left   . Hyperlipidemia   . Hypertension     Past Surgical History:  Procedure Laterality Date  . Lynx mid urethral sling     Dr Beverley Fiedler    Social History   Social History  . Marital status: Married    Spouse name: N/A  . Number of children: N/A  . Years of education: N/A   Occupational History  . Not on file.   Social History Main Topics  . Smoking status: Former Smoker    Packs/day: 0.40    Quit date: 03/28/2016  . Smokeless tobacco: Never Used  . Alcohol use No  . Drug use: Unknown  . Sexual activity: Yes   Other Topics Concern  . Not on file   Social History Narrative  . No narrative on file    Family History  Problem Relation Age of Onset  .  Hyperlipidemia Mother   . Hypertension Mother   . Cancer Father     Oat cell lung CA  . Alcohol abuse Father     Outpatient Encounter Prescriptions as of 12/19/2016  Medication Sig  . albuterol (PROAIR HFA) 108 (90 Base) MCG/ACT inhaler Inhale 2-4 puffs into the lungs every 6 (six) hours as needed.  Marland Kitchen albuterol (PROVENTIL) (2.5 MG/3ML) 0.083% nebulizer solution Take 3 mLs (2.5 mg total) by nebulization every 6 (six) hours as needed for wheezing or shortness of breath.  Marland Kitchen amLODipine (NORVASC) 5 MG tablet TAKE 1 TABLET EVERY DAY  . aspirin 81 MG tablet Take 81 mg by mouth daily.    . budesonide-formoterol (SYMBICORT) 160-4.5 MCG/ACT inhaler Inhale 2 puffs into the lungs 2 (two) times daily.  Marland Kitchen buPROPion (WELLBUTRIN XL) 150 MG 24 hr tablet   . esomeprazole (NEXIUM) 40 MG capsule TAKE 1 CAPSULE (40 MG TOTAL) BY MOUTH DAILY.  Marland Kitchen gabapentin (NEURONTIN) 300 MG capsule   . ipratropium (ATROVENT) 0.02 % nebulizer solution TAKE 2.5 MLS (0.5 MG TOTAL) BY NEBULIZATION 4 (FOUR) TIMES DAILY.  Marland Kitchen losartan (  COZAAR) 25 MG tablet TAKE 1 TABLET EVERY DAY  . methocarbamol (ROBAXIN) 750 MG tablet TAKE 1 TABLET BY MOUTH TWICE A DAY FOR 30 DAYS  . metoprolol (LOPRESSOR) 100 MG tablet Take 1 tablet (100 mg total) by mouth 2 (two) times daily.  . naproxen (NAPROSYN) 500 MG tablet TAKE 1 TABLET (500 MG TOTAL) BY MOUTH 2 (TWO) TIMES DAILY WITH A MEAL.  Marland Kitchen rOPINIRole (REQUIP) 0.5 MG tablet   . sertraline (ZOLOFT) 100 MG tablet TAKE 1 TABLET EVERY DAY  . traMADol (ULTRAM) 50 MG tablet 1-2 tabs by mouth Q8 hours, maximum 6 tabs per day.  . [DISCONTINUED] albuterol (PROAIR HFA) 108 (90 BASE) MCG/ACT inhaler Inhale 2-4 puffs into the lungs every 6 (six) hours as needed.  . [DISCONTINUED] budesonide-formoterol (SYMBICORT) 160-4.5 MCG/ACT inhaler Inhale 2 puffs into the lungs 2 (two) times daily.  Marland Kitchen azithromycin (ZITHROMAX) 250 MG tablet 2 Ttabs PO on Day 1, then one a day x 4 days.  . predniSONE (DELTASONE) 20 MG tablet Take  2 tablets (40 mg total) by mouth daily.  . promethazine-dextromethorphan (PROMETHAZINE-DM) 6.25-15 MG/5ML syrup Take 5 mLs by mouth at bedtime. For cough  . [DISCONTINUED] diazepam (VALIUM) 5 MG tablet Take 1 tab PO 1 hour before procedure or imaging.  . [DISCONTINUED] fluticasone (FLONASE) 50 MCG/ACT nasal spray Place 2 sprays into both nostrils daily.  . [DISCONTINUED] mirabegron ER (MYRBETRIQ) 25 MG TB24 tablet Take by mouth.  . [EXPIRED] albuterol (PROVENTIL) (2.5 MG/3ML) 0.083% nebulizer solution 2.5 mg   . [EXPIRED] methylPREDNISolone acetate (DEPO-MEDROL) injection 80 mg   . [EXPIRED] methylPREDNISolone sodium succinate (SOLU-MEDROL) 125 mg/2 mL injection 125 mg    No facility-administered encounter medications on file as of 12/19/2016.          Objective:   Physical Exam  Constitutional: She is oriented to person, place, and time. She appears well-developed and well-nourished.  HENT:  Head: Normocephalic and atraumatic.  Right Ear: External ear normal.  Left Ear: External ear normal.  Nose: Nose normal.  Mouth/Throat: Oropharynx is clear and moist.  TMs and canals are clear.   Eyes: Conjunctivae and EOM are normal. Pupils are equal, round, and reactive to light.  Neck: Neck supple. No thyromegaly present.  Cardiovascular: Normal rate, regular rhythm and normal heart sounds.   Pulmonary/Chest: Effort normal. She has wheezes.  Diffuse expiratory wheezing  Lymphadenopathy:    She has no cervical adenopathy.  Neurological: She is alert and oriented to person, place, and time.  Skin: Skin is warm and dry.  Psychiatric: She has a normal mood and affect.        Assessment & Plan:  COPD exacerbation - will treat with prednisone. Recommend that she start using her albuterol as needed especially for cough and wheezing. She might even want to go ahead and restart her Symbicort through the spring. Actually like to schedule her for spirometry in one month. Call if not better in one  week. Okay to fill the azithromycin if she's not getting better in the next couple days.  URI - likely viral but if not better in a copule of days please start the azithromycin.   Patient declined oral prednisone and decided she would prefer an injection. Given Solu-Medrol 125 mg as well as Depo-Medrol 80 mg.

## 2017-01-09 DIAGNOSIS — G4733 Obstructive sleep apnea (adult) (pediatric): Secondary | ICD-10-CM | POA: Diagnosis not present

## 2017-01-11 DIAGNOSIS — J189 Pneumonia, unspecified organism: Secondary | ICD-10-CM | POA: Diagnosis not present

## 2017-01-12 DIAGNOSIS — N8110 Cystocele, unspecified: Secondary | ICD-10-CM | POA: Diagnosis not present

## 2017-01-17 ENCOUNTER — Ambulatory Visit (INDEPENDENT_AMBULATORY_CARE_PROVIDER_SITE_OTHER): Payer: Medicare HMO | Admitting: Family Medicine

## 2017-01-17 VITALS — BP 138/82 | HR 67 | Ht 65.0 in | Wt 187.0 lb

## 2017-01-17 DIAGNOSIS — J449 Chronic obstructive pulmonary disease, unspecified: Secondary | ICD-10-CM | POA: Diagnosis not present

## 2017-01-17 DIAGNOSIS — J441 Chronic obstructive pulmonary disease with (acute) exacerbation: Secondary | ICD-10-CM | POA: Diagnosis not present

## 2017-01-17 DIAGNOSIS — J410 Simple chronic bronchitis: Secondary | ICD-10-CM | POA: Diagnosis not present

## 2017-01-17 MED ORDER — ALBUTEROL SULFATE (2.5 MG/3ML) 0.083% IN NEBU
2.5000 mg | INHALATION_SOLUTION | Freq: Once | RESPIRATORY_TRACT | Status: AC
  Administered 2017-01-17: 2.5 mg via RESPIRATORY_TRACT

## 2017-01-17 NOTE — Progress Notes (Signed)
   Subjective:    Patient ID: Nancy Cisneros, female    DOB: [birth year], 76 y.o.   MRN: [id number]  HPI 76 year old female comes in today to further evaluate her COPD was spirometry. He not have o one on file for her. She has recently had a COPD exacerbation so we started her back on her Symbicort through the spring. She was diagnosed with COPD back in 2011. He frequently gets respiratory infections through the wintertime and has difficulty getting over them because of shortness of breath and cough. Right now she is feeling well and doing well.   Review of Systems     Objective:   Physical Exam  Constitutional: She is oriented to person, place, and time. She appears well-developed and well-nourished.  HENT:  Head: Normocephalic and atraumatic.  Cardiovascular: Normal rate, regular rhythm and normal heart sounds.   Pulmonary/Chest: Effort normal and breath sounds normal.  Neurological: She is alert and oriented to person, place, and time.  Skin: Skin is warm and dry.  Psychiatric: She has a normal mood and affect. Her behavior is normal.          Assessment & Plan:  Mild COPD- reviewed results with her today. She had a good attempt at spirometry. Spirometry May 2018 shows FVC of 95%, FEV1 of 85% with a ratio 66. No significant change with albuterol. This on her results I think we can just use the Symbicort seasonally. I do think allergies and pollen are big aggravator for her breathing. She's to continue wearing the oxygen at night with her CPAP.

## 2017-01-20 DIAGNOSIS — M25561 Pain in right knee: Secondary | ICD-10-CM | POA: Diagnosis not present

## 2017-01-20 DIAGNOSIS — Z9889 Other specified postprocedural states: Secondary | ICD-10-CM | POA: Diagnosis not present

## 2017-01-23 DIAGNOSIS — R309 Painful micturition, unspecified: Secondary | ICD-10-CM | POA: Diagnosis not present

## 2017-01-23 DIAGNOSIS — Z752 Other waiting period for investigation and treatment: Secondary | ICD-10-CM | POA: Diagnosis not present

## 2017-01-23 DIAGNOSIS — N390 Urinary tract infection, site not specified: Secondary | ICD-10-CM | POA: Diagnosis not present

## 2017-02-01 DIAGNOSIS — R202 Paresthesia of skin: Secondary | ICD-10-CM | POA: Diagnosis not present

## 2017-02-01 DIAGNOSIS — M5412 Radiculopathy, cervical region: Secondary | ICD-10-CM | POA: Diagnosis not present

## 2017-02-01 DIAGNOSIS — G603 Idiopathic progressive neuropathy: Secondary | ICD-10-CM | POA: Diagnosis not present

## 2017-02-01 DIAGNOSIS — M5442 Lumbago with sciatica, left side: Secondary | ICD-10-CM | POA: Diagnosis not present

## 2017-02-01 DIAGNOSIS — M5441 Lumbago with sciatica, right side: Secondary | ICD-10-CM | POA: Diagnosis not present

## 2017-02-01 DIAGNOSIS — G2581 Restless legs syndrome: Secondary | ICD-10-CM | POA: Diagnosis not present

## 2017-02-02 ENCOUNTER — Ambulatory Visit (INDEPENDENT_AMBULATORY_CARE_PROVIDER_SITE_OTHER): Payer: Medicare HMO

## 2017-02-02 ENCOUNTER — Other Ambulatory Visit: Payer: Self-pay | Admitting: Specialist

## 2017-02-02 DIAGNOSIS — M5415 Radiculopathy, thoracolumbar region: Secondary | ICD-10-CM

## 2017-02-02 DIAGNOSIS — I700 Atherosclerosis of aorta: Secondary | ICD-10-CM | POA: Diagnosis not present

## 2017-02-02 DIAGNOSIS — M5116 Intervertebral disc disorders with radiculopathy, lumbar region: Secondary | ICD-10-CM | POA: Diagnosis not present

## 2017-02-02 DIAGNOSIS — M545 Low back pain: Secondary | ICD-10-CM | POA: Diagnosis not present

## 2017-02-03 DIAGNOSIS — M1711 Unilateral primary osteoarthritis, right knee: Secondary | ICD-10-CM | POA: Diagnosis not present

## 2017-02-03 DIAGNOSIS — I10 Essential (primary) hypertension: Secondary | ICD-10-CM | POA: Diagnosis not present

## 2017-02-09 DIAGNOSIS — N3946 Mixed incontinence: Secondary | ICD-10-CM | POA: Diagnosis not present

## 2017-02-09 DIAGNOSIS — N8110 Cystocele, unspecified: Secondary | ICD-10-CM | POA: Diagnosis not present

## 2017-02-09 DIAGNOSIS — I10 Essential (primary) hypertension: Secondary | ICD-10-CM | POA: Diagnosis not present

## 2017-02-09 DIAGNOSIS — G4733 Obstructive sleep apnea (adult) (pediatric): Secondary | ICD-10-CM | POA: Diagnosis not present

## 2017-02-11 DIAGNOSIS — J189 Pneumonia, unspecified organism: Secondary | ICD-10-CM | POA: Diagnosis not present

## 2017-03-07 ENCOUNTER — Telehealth: Payer: Self-pay

## 2017-03-07 NOTE — Telephone Encounter (Signed)
Pt called trying to get an acute appointment with Dr. Madilyn Fireman because she "feels like one side of her face has been injected with novocaine". Pt also states that she called yesterday for same reason and only wants to see Dr. Madilyn Fireman and also asked if provider had appointment tomorrow. I went and spoke with Dr. Madilyn Fireman and let her know pt symptoms. Provider then told me to tell pt she needs to go to the ER because there are scans that she may need that can't be performed in this office. Told pt information and she verbalized understanding.

## 2017-03-13 DIAGNOSIS — J189 Pneumonia, unspecified organism: Secondary | ICD-10-CM | POA: Diagnosis not present

## 2017-03-15 DIAGNOSIS — E785 Hyperlipidemia, unspecified: Secondary | ICD-10-CM | POA: Diagnosis not present

## 2017-03-15 DIAGNOSIS — M1711 Unilateral primary osteoarthritis, right knee: Secondary | ICD-10-CM | POA: Diagnosis not present

## 2017-03-15 DIAGNOSIS — I2510 Atherosclerotic heart disease of native coronary artery without angina pectoris: Secondary | ICD-10-CM | POA: Diagnosis not present

## 2017-03-15 DIAGNOSIS — Z01810 Encounter for preprocedural cardiovascular examination: Secondary | ICD-10-CM | POA: Diagnosis not present

## 2017-03-15 DIAGNOSIS — Z01812 Encounter for preprocedural laboratory examination: Secondary | ICD-10-CM | POA: Diagnosis not present

## 2017-03-15 DIAGNOSIS — Z79899 Other long term (current) drug therapy: Secondary | ICD-10-CM | POA: Diagnosis not present

## 2017-03-15 DIAGNOSIS — Z01818 Encounter for other preprocedural examination: Secondary | ICD-10-CM | POA: Diagnosis not present

## 2017-04-03 DIAGNOSIS — M1711 Unilateral primary osteoarthritis, right knee: Secondary | ICD-10-CM | POA: Diagnosis not present

## 2017-04-03 DIAGNOSIS — I10 Essential (primary) hypertension: Secondary | ICD-10-CM | POA: Diagnosis not present

## 2017-04-03 DIAGNOSIS — G4733 Obstructive sleep apnea (adult) (pediatric): Secondary | ICD-10-CM | POA: Diagnosis not present

## 2017-04-03 DIAGNOSIS — Z7982 Long term (current) use of aspirin: Secondary | ICD-10-CM | POA: Diagnosis not present

## 2017-04-03 DIAGNOSIS — I2510 Atherosclerotic heart disease of native coronary artery without angina pectoris: Secondary | ICD-10-CM | POA: Diagnosis not present

## 2017-04-03 DIAGNOSIS — E785 Hyperlipidemia, unspecified: Secondary | ICD-10-CM | POA: Diagnosis not present

## 2017-04-03 DIAGNOSIS — Z9989 Dependence on other enabling machines and devices: Secondary | ICD-10-CM | POA: Diagnosis not present

## 2017-04-03 DIAGNOSIS — K219 Gastro-esophageal reflux disease without esophagitis: Secondary | ICD-10-CM | POA: Diagnosis not present

## 2017-04-03 DIAGNOSIS — J449 Chronic obstructive pulmonary disease, unspecified: Secondary | ICD-10-CM | POA: Diagnosis not present

## 2017-04-06 DIAGNOSIS — E785 Hyperlipidemia, unspecified: Secondary | ICD-10-CM | POA: Diagnosis not present

## 2017-04-06 DIAGNOSIS — J449 Chronic obstructive pulmonary disease, unspecified: Secondary | ICD-10-CM | POA: Diagnosis not present

## 2017-04-06 DIAGNOSIS — I10 Essential (primary) hypertension: Secondary | ICD-10-CM | POA: Diagnosis not present

## 2017-04-06 DIAGNOSIS — I2510 Atherosclerotic heart disease of native coronary artery without angina pectoris: Secondary | ICD-10-CM | POA: Diagnosis not present

## 2017-04-06 DIAGNOSIS — Z96651 Presence of right artificial knee joint: Secondary | ICD-10-CM | POA: Diagnosis not present

## 2017-04-06 DIAGNOSIS — Z471 Aftercare following joint replacement surgery: Secondary | ICD-10-CM | POA: Diagnosis not present

## 2017-04-06 DIAGNOSIS — G4733 Obstructive sleep apnea (adult) (pediatric): Secondary | ICD-10-CM | POA: Diagnosis not present

## 2017-04-07 ENCOUNTER — Other Ambulatory Visit: Payer: Self-pay

## 2017-04-07 NOTE — Patient Outreach (Addendum)
[facility]) Care Management  04/07/2017  Symiah Nowotny Chuang [birth year] [id number]     Transition of Care Referral  Referral Date: 04/07/17 Referral Source: [facility] Discharge Report Date of Admission: unknown Diagnosis: unknown Date of Discharge: 04/05/17 Facility: [place]: [facility] Medicare    Outreach attempt # 1 to patient. Spoke with patient. She voices that she was discharged on yesterday evening. She states she is doing fine since return home so far. She has supportive family to assist with her care needs. She has MD f/u appt on 04/18/17. She voices no issues with transportation. She states she has all her meds and understands when and how to take them. Patient voices no RN CM needs or concerns at this time.    Plan: RN CM will notify [facility] administrative assistant of case status.   Enzo Montgomery, RN,BSN,CCM [place] Management Telephonic Care Management Coordinator Direct Phone: [phone] Toll Free: [phone] Fax: [phone]

## 2017-04-08 DIAGNOSIS — E785 Hyperlipidemia, unspecified: Secondary | ICD-10-CM | POA: Diagnosis not present

## 2017-04-08 DIAGNOSIS — I2510 Atherosclerotic heart disease of native coronary artery without angina pectoris: Secondary | ICD-10-CM | POA: Diagnosis not present

## 2017-04-08 DIAGNOSIS — J449 Chronic obstructive pulmonary disease, unspecified: Secondary | ICD-10-CM | POA: Diagnosis not present

## 2017-04-08 DIAGNOSIS — G4733 Obstructive sleep apnea (adult) (pediatric): Secondary | ICD-10-CM | POA: Diagnosis not present

## 2017-04-08 DIAGNOSIS — Z471 Aftercare following joint replacement surgery: Secondary | ICD-10-CM | POA: Diagnosis not present

## 2017-04-08 DIAGNOSIS — Z96651 Presence of right artificial knee joint: Secondary | ICD-10-CM | POA: Diagnosis not present

## 2017-04-08 DIAGNOSIS — I10 Essential (primary) hypertension: Secondary | ICD-10-CM | POA: Diagnosis not present

## 2017-04-11 DIAGNOSIS — I10 Essential (primary) hypertension: Secondary | ICD-10-CM | POA: Diagnosis not present

## 2017-04-11 DIAGNOSIS — Z96651 Presence of right artificial knee joint: Secondary | ICD-10-CM | POA: Diagnosis not present

## 2017-04-11 DIAGNOSIS — G4733 Obstructive sleep apnea (adult) (pediatric): Secondary | ICD-10-CM | POA: Diagnosis not present

## 2017-04-11 DIAGNOSIS — Z471 Aftercare following joint replacement surgery: Secondary | ICD-10-CM | POA: Diagnosis not present

## 2017-04-11 DIAGNOSIS — E785 Hyperlipidemia, unspecified: Secondary | ICD-10-CM | POA: Diagnosis not present

## 2017-04-11 DIAGNOSIS — I2510 Atherosclerotic heart disease of native coronary artery without angina pectoris: Secondary | ICD-10-CM | POA: Diagnosis not present

## 2017-04-11 DIAGNOSIS — J449 Chronic obstructive pulmonary disease, unspecified: Secondary | ICD-10-CM | POA: Diagnosis not present

## 2017-04-13 DIAGNOSIS — E785 Hyperlipidemia, unspecified: Secondary | ICD-10-CM | POA: Diagnosis not present

## 2017-04-13 DIAGNOSIS — Z471 Aftercare following joint replacement surgery: Secondary | ICD-10-CM | POA: Diagnosis not present

## 2017-04-13 DIAGNOSIS — I2510 Atherosclerotic heart disease of native coronary artery without angina pectoris: Secondary | ICD-10-CM | POA: Diagnosis not present

## 2017-04-13 DIAGNOSIS — G4733 Obstructive sleep apnea (adult) (pediatric): Secondary | ICD-10-CM | POA: Diagnosis not present

## 2017-04-13 DIAGNOSIS — Z96651 Presence of right artificial knee joint: Secondary | ICD-10-CM | POA: Diagnosis not present

## 2017-04-13 DIAGNOSIS — I10 Essential (primary) hypertension: Secondary | ICD-10-CM | POA: Diagnosis not present

## 2017-04-13 DIAGNOSIS — J189 Pneumonia, unspecified organism: Secondary | ICD-10-CM | POA: Diagnosis not present

## 2017-04-13 DIAGNOSIS — J449 Chronic obstructive pulmonary disease, unspecified: Secondary | ICD-10-CM | POA: Diagnosis not present

## 2017-04-14 DIAGNOSIS — G4733 Obstructive sleep apnea (adult) (pediatric): Secondary | ICD-10-CM | POA: Diagnosis not present

## 2017-04-14 DIAGNOSIS — I2510 Atherosclerotic heart disease of native coronary artery without angina pectoris: Secondary | ICD-10-CM | POA: Diagnosis not present

## 2017-04-14 DIAGNOSIS — E785 Hyperlipidemia, unspecified: Secondary | ICD-10-CM | POA: Diagnosis not present

## 2017-04-14 DIAGNOSIS — J449 Chronic obstructive pulmonary disease, unspecified: Secondary | ICD-10-CM | POA: Diagnosis not present

## 2017-04-14 DIAGNOSIS — Z471 Aftercare following joint replacement surgery: Secondary | ICD-10-CM | POA: Diagnosis not present

## 2017-04-14 DIAGNOSIS — Z96651 Presence of right artificial knee joint: Secondary | ICD-10-CM | POA: Diagnosis not present

## 2017-04-14 DIAGNOSIS — I10 Essential (primary) hypertension: Secondary | ICD-10-CM | POA: Diagnosis not present

## 2017-04-18 DIAGNOSIS — Z96651 Presence of right artificial knee joint: Secondary | ICD-10-CM | POA: Diagnosis not present

## 2017-04-18 DIAGNOSIS — M25561 Pain in right knee: Secondary | ICD-10-CM | POA: Diagnosis not present

## 2017-04-20 DIAGNOSIS — Z96651 Presence of right artificial knee joint: Secondary | ICD-10-CM | POA: Diagnosis not present

## 2017-04-20 DIAGNOSIS — M25561 Pain in right knee: Secondary | ICD-10-CM | POA: Diagnosis not present

## 2017-04-26 DIAGNOSIS — M25661 Stiffness of right knee, not elsewhere classified: Secondary | ICD-10-CM | POA: Diagnosis not present

## 2017-04-26 DIAGNOSIS — M25561 Pain in right knee: Secondary | ICD-10-CM | POA: Diagnosis not present

## 2017-04-26 DIAGNOSIS — Z471 Aftercare following joint replacement surgery: Secondary | ICD-10-CM | POA: Diagnosis not present

## 2017-04-26 DIAGNOSIS — Z96651 Presence of right artificial knee joint: Secondary | ICD-10-CM | POA: Diagnosis not present

## 2017-04-28 ENCOUNTER — Other Ambulatory Visit: Payer: Self-pay | Admitting: Family Medicine

## 2017-05-01 DIAGNOSIS — Z471 Aftercare following joint replacement surgery: Secondary | ICD-10-CM | POA: Diagnosis not present

## 2017-05-01 DIAGNOSIS — Z96651 Presence of right artificial knee joint: Secondary | ICD-10-CM | POA: Diagnosis not present

## 2017-05-01 DIAGNOSIS — M25661 Stiffness of right knee, not elsewhere classified: Secondary | ICD-10-CM | POA: Diagnosis not present

## 2017-05-01 DIAGNOSIS — M25561 Pain in right knee: Secondary | ICD-10-CM | POA: Diagnosis not present

## 2017-05-03 DIAGNOSIS — Z471 Aftercare following joint replacement surgery: Secondary | ICD-10-CM | POA: Diagnosis not present

## 2017-05-03 DIAGNOSIS — M25561 Pain in right knee: Secondary | ICD-10-CM | POA: Diagnosis not present

## 2017-05-03 DIAGNOSIS — M25661 Stiffness of right knee, not elsewhere classified: Secondary | ICD-10-CM | POA: Diagnosis not present

## 2017-05-03 DIAGNOSIS — Z96651 Presence of right artificial knee joint: Secondary | ICD-10-CM | POA: Diagnosis not present

## 2017-05-03 DIAGNOSIS — R2681 Unsteadiness on feet: Secondary | ICD-10-CM | POA: Diagnosis not present

## 2017-05-03 DIAGNOSIS — R202 Paresthesia of skin: Secondary | ICD-10-CM | POA: Diagnosis not present

## 2017-05-03 DIAGNOSIS — M5442 Lumbago with sciatica, left side: Secondary | ICD-10-CM | POA: Diagnosis not present

## 2017-05-03 DIAGNOSIS — M5441 Lumbago with sciatica, right side: Secondary | ICD-10-CM | POA: Diagnosis not present

## 2017-05-03 DIAGNOSIS — G603 Idiopathic progressive neuropathy: Secondary | ICD-10-CM | POA: Diagnosis not present

## 2017-05-14 DIAGNOSIS — J189 Pneumonia, unspecified organism: Secondary | ICD-10-CM | POA: Diagnosis not present

## 2017-05-26 ENCOUNTER — Other Ambulatory Visit: Payer: Self-pay | Admitting: Family Medicine

## 2017-05-29 ENCOUNTER — Encounter: Payer: Self-pay | Admitting: Family Medicine

## 2017-05-29 ENCOUNTER — Ambulatory Visit (INDEPENDENT_AMBULATORY_CARE_PROVIDER_SITE_OTHER): Payer: Medicare HMO | Admitting: Family Medicine

## 2017-05-29 VITALS — BP 136/74 | HR 68 | Temp 98.2°F | Ht 65.0 in | Wt 183.0 lb

## 2017-05-29 DIAGNOSIS — R197 Diarrhea, unspecified: Secondary | ICD-10-CM | POA: Diagnosis not present

## 2017-05-29 DIAGNOSIS — R630 Anorexia: Secondary | ICD-10-CM

## 2017-05-29 DIAGNOSIS — I10 Essential (primary) hypertension: Secondary | ICD-10-CM

## 2017-05-29 DIAGNOSIS — R110 Nausea: Secondary | ICD-10-CM | POA: Diagnosis not present

## 2017-05-29 DIAGNOSIS — R5383 Other fatigue: Secondary | ICD-10-CM

## 2017-05-29 DIAGNOSIS — R490 Dysphonia: Secondary | ICD-10-CM

## 2017-05-29 DIAGNOSIS — R6883 Chills (without fever): Secondary | ICD-10-CM | POA: Diagnosis not present

## 2017-05-29 NOTE — Progress Notes (Signed)
Subjective:    Patient ID: Nancy Cisneros, female    DOB: 1940/12/23, 76 y.o.   MRN: [id number]  HPI 76 yo female comes in today c/o nausea since the has right knee replacement done 04/03/2017. Doesn't matter what she eats. She has had chills.  Having intermittant diarrhea.  She is down 4 lbs since last spring.  She also feels like she has low energy.  She feels her voice has been raspy since surgery as well. No CP or SOB.  She has not actually vomited. No fever but has been having some chills and occasional sweats. Again no chest pain. She also reports that she has not been wearing her CPAP at night since she came home from the hospital. She tried to wear a couple nights but was really struggling keeping it on such as hasn't been consistent since then. She gets nauseated every time she eats and that last for a couple of hours. She's been taking Tums almost daily. She sometimes takes her Nexium but not every day. She's also been doing some peppermint.   Review of Systems  No abdominal pain. No blood in the stool. No motion me swelling.  BP 136/74   Pulse 68   Temp 98.2 F (36.8 C)   Ht 5\' 5"  (1.651 m)   Wt 183 lb (83 kg)   SpO2 98%   BMI 30.45 kg/m     Allergies  Allergen Reactions  . Codeine     REACTION: trouble breathing  . Ramipril     REACTION: cough    Past Medical History:  Diagnosis Date  . Adrenal adenoma    1 cm Left  . CAD (coronary artery disease)    Dr Prince Rome, [facility] cardiology  . COPD (chronic obstructive pulmonary disease) (HCC)    lung nodule- Dr Tilden Dome  . Diverticulosis of colon   . Hip pain, left   . Hyperlipidemia   . Hypertension     Past Surgical History:  Procedure Laterality Date  . Lynx mid urethral sling     Dr Beverley Fiedler    Social History   Social History  . Marital status: Married    Spouse name: N/A  . Number of children: N/A  . Years of education: N/A   Occupational History  . Not on file.   Social History Main Topics  .  Smoking status: Former Smoker    Packs/day: 0.40    Quit date: 03/28/2016  . Smokeless tobacco: Never Used  . Alcohol use No  . Drug use: Unknown  . Sexual activity: Yes   Other Topics Concern  . Not on file   Social History Narrative  . No narrative on file    Family History  Problem Relation Age of Onset  . Hyperlipidemia Mother   . Hypertension Mother   . Cancer Father        Oat cell lung CA  . Alcohol abuse Father     Outpatient Encounter Prescriptions as of 05/29/2017  Medication Sig  . albuterol (PROAIR HFA) 108 (90 Base) MCG/ACT inhaler Inhale 2-4 puffs into the lungs every 6 (six) hours as needed.  Marland Kitchen albuterol (PROVENTIL) (2.5 MG/3ML) 0.083% nebulizer solution Take 3 mLs (2.5 mg total) by nebulization every 6 (six) hours as needed for wheezing or shortness of breath.  Marland Kitchen amLODipine (NORVASC) 5 MG tablet TAKE 1 TABLET EVERY DAY  . aspirin 81 MG tablet Take 81 mg by mouth daily.    Marland Kitchen buPROPion [facility]  XL) 150 MG 24 hr tablet   . esomeprazole (NEXIUM) 40 MG capsule TAKE 1 CAPSULE (40 MG TOTAL) BY MOUTH DAILY.  Marland Kitchen gabapentin (NEURONTIN) 300 MG capsule   . ipratropium (ATROVENT) 0.02 % nebulizer solution TAKE 2.5 MLS (0.5 MG TOTAL) BY NEBULIZATION 4 (FOUR) TIMES DAILY.  Marland Kitchen losartan (COZAAR) 25 MG tablet TAKE 1 TABLET EVERY DAY  . metoprolol (LOPRESSOR) 100 MG tablet Take 1 tablet (100 mg total) by mouth 2 (two) times daily.  Marland Kitchen rOPINIRole (REQUIP) 0.5 MG tablet   . sertraline (ZOLOFT) 100 MG tablet TAKE 1 TABLET EVERY DAY  . budesonide-formoterol (SYMBICORT) 160-4.5 MCG/ACT inhaler Inhale 2 puffs into the lungs 2 (two) times daily. (Patient not taking: Reported on 05/29/2017)  . [DISCONTINUED] methocarbamol (ROBAXIN) 750 MG tablet TAKE 1 TABLET BY MOUTH TWICE A DAY FOR 30 DAYS  . [DISCONTINUED] naproxen (NAPROSYN) 500 MG tablet TAKE 1 TABLET (500 MG TOTAL) BY MOUTH 2 (TWO) TIMES DAILY WITH A MEAL.  . [DISCONTINUED] traMADol (ULTRAM) 50 MG tablet 1-2 tabs by mouth Q8 hours,  maximum 6 tabs per day.   No facility-administered encounter medications on file as of 05/29/2017.          Objective:   Physical Exam  Constitutional: She is oriented to person, place, and time. She appears well-developed and well-nourished.  HENT:  Head: Normocephalic and atraumatic.  Right Ear: External ear normal.  Left Ear: External ear normal.  Nose: Nose normal.  Mouth/Throat: Oropharynx is clear and moist.  TMs and canals are clear.   Eyes: Pupils are equal, round, and reactive to light. Conjunctivae and EOM are normal.  Neck: Neck supple. No thyromegaly present.  Cardiovascular: Normal rate, regular rhythm and normal heart sounds.   Pulmonary/Chest: Effort normal and breath sounds normal. She has no wheezes.  Abdominal: Soft. Bowel sounds are normal. She exhibits no distension and no mass. There is no tenderness. There is no rebound and no guarding.  Musculoskeletal: She exhibits no edema.  Lymphadenopathy:    She has no cervical adenopathy.  Neurological: She is alert and oriented to person, place, and time.  Skin: Skin is warm and dry.  Psychiatric: She has a normal mood and affect.          Assessment & Plan:  Nausea/anorexia - Unclear etiology at this point. Consider GERD or gastritis. We'll start her Nexium and have her take it twice a day consistently for at least the next 2 weeks to see if she starts to feel better. Consider other causes such as esophagitis. Status post surgery she certainly is at risk for candidal esophagitis that she's not having any pain swallowing. Still could consider treatment with nystatin swish and swallow if not improvement, especially with her raspy voice.  Voice raspiness-see note above.  HTN  - Repeat BP at goal.    Fatigue-evaluate for anemia and thyroid disorder.  Chills - monitor for fever. Check TSH and CBC.   Diarrhea - intermittant. Unclear.  Low risk for c diff.

## 2017-05-29 NOTE — Patient Instructions (Signed)
Start taking her Nexium twice a day. The first tab about 20-30 minutes before the  pill of the day and the second tab at bedtime.  Start using her CPAP again.

## 2017-05-30 ENCOUNTER — Telehealth: Payer: Self-pay | Admitting: Family Medicine

## 2017-05-30 LAB — URINALYSIS, ROUTINE W REFLEX MICROSCOPIC
BILIRUBIN URINE: NEGATIVE
GLUCOSE, UA: NEGATIVE
HGB URINE DIPSTICK: NEGATIVE
KETONES UR: NEGATIVE
Leukocytes, UA: NEGATIVE
Nitrite: NEGATIVE
PROTEIN: NEGATIVE
Specific Gravity, Urine: 1.011 (ref 1.001–1.03)
pH: 6 (ref 5.0–8.0)

## 2017-05-30 LAB — TSH: TSH: 1.42 mIU/L (ref 0.40–4.50)

## 2017-05-30 LAB — CBC WITH DIFFERENTIAL/PLATELET
BASOS ABS: 31 {cells}/uL (ref 0–200)
Basophils Relative: 0.4 %
EOS PCT: 3.1 %
Eosinophils Absolute: 239 cells/uL (ref 15–500)
HCT: 39 % (ref 35.0–45.0)
HEMOGLOBIN: 13.1 g/dL (ref 11.7–15.5)
LYMPHS ABS: 2510 {cells}/uL (ref 850–3900)
MCH: 28.8 pg (ref 27.0–33.0)
MCHC: 33.6 g/dL (ref 32.0–36.0)
MCV: 85.7 fL (ref 80.0–100.0)
MONOS PCT: 6.3 %
MPV: 11.1 fL (ref 7.5–12.5)
NEUTROS ABS: 4435 {cells}/uL (ref 1500–7800)
Neutrophils Relative %: 57.6 %
Platelets: 256 10*3/uL (ref 140–400)
RBC: 4.55 10*6/uL (ref 3.80–5.10)
RDW: 14.3 % (ref 11.0–15.0)
Total Lymphocyte: 32.6 %
WBC mixed population: 485 cells/uL (ref 200–950)
WBC: 7.7 10*3/uL (ref 3.8–10.8)

## 2017-05-30 LAB — COMPLETE METABOLIC PANEL WITH GFR
AG Ratio: 1.6 (calc) (ref 1.0–2.5)
ALBUMIN MSPROF: 3.9 g/dL (ref 3.6–5.1)
ALT: 9 U/L (ref 6–29)
AST: 12 U/L (ref 10–35)
Alkaline phosphatase (APISO): 80 U/L (ref 33–130)
BILIRUBIN TOTAL: 0.3 mg/dL (ref 0.2–1.2)
BUN / CREAT RATIO: 10 (calc) (ref 6–22)
BUN: 10 mg/dL (ref 7–25)
CHLORIDE: 103 mmol/L (ref 98–110)
CO2: 30 mmol/L (ref 20–32)
CREATININE: 1.02 mg/dL — AB (ref 0.60–0.93)
Calcium: 9.5 mg/dL (ref 8.6–10.4)
GFR, EST AFRICAN AMERICAN: 61 mL/min/{1.73_m2} (ref >=60)
GFR, Est Non African American: 53 mL/min/{1.73_m2} — ABNORMAL LOW (ref >=60)
GLOBULIN: 2.5 g/dL (ref 1.9–3.7)
GLUCOSE: 84 mg/dL (ref 65–99)
Potassium: 4.7 mmol/L (ref 3.5–5.3)
Sodium: 138 mmol/L (ref 135–146)
TOTAL PROTEIN: 6.4 g/dL (ref 6.1–8.1)

## 2017-05-30 LAB — FERRITIN: FERRITIN: 97 ng/mL (ref 20–288)

## 2017-05-30 LAB — VITAMIN B12: VITAMIN B 12: 270 pg/mL (ref 200–1100)

## 2017-05-30 NOTE — Telephone Encounter (Signed)
Called the patient she stated that she was told the money up front was whatever the insurance would not cover. I asked the patient if she had a balance the reason why they were collecting it and she said no it was what her insurance wouldn't cover.Patient stated that she would fax over a copy to see if something could be done.  Patient copy of receipt is in primary care provider basket.Nancy Cisneros,CMA

## 2017-05-30 NOTE — Telephone Encounter (Signed)
Is this refill appropriate? -EH/RMA  

## 2017-05-30 NOTE — Telephone Encounter (Signed)
Patient called back returning Rhonda's call. Adv pt of lab result notes and patient stated she still feels the same. Adv that she use to take B-12 injections is that something she should start doing again? Pt stated that when she went to get labs done they advised her that she had to pay $232.00 and she was shocked. Pt stated what if she did not have a credit card or debit card and they told her she would need to get a money order? Is this what [facility] will be telling our patients and employees?

## 2017-05-31 NOTE — Telephone Encounter (Signed)
OK, what I would recommend is to let them process the labs and run them through the insurance to see what they will actually pay for or denied. Often the lab will make an estimation of what they think it will pay or not pay but ultimately I would say to let the insurance see what they will do. If there is anything that is not covered when she gets the final bill them please let me know and sometimes we can resubmit under additional codes and see if by changing the code they will then pay for it. We don't always know and each insurance is different from what [employer] will pay for wet lab so we usually just order what we think is most appropriate that we can try some additional codes if it's not covered. I will definitely look at the receipt in the basket.

## 2017-06-02 ENCOUNTER — Encounter: Payer: Self-pay | Admitting: Family Medicine

## 2017-06-02 ENCOUNTER — Other Ambulatory Visit: Payer: Self-pay | Admitting: Family Medicine

## 2017-06-07 DIAGNOSIS — I10 Essential (primary) hypertension: Secondary | ICD-10-CM | POA: Diagnosis not present

## 2017-06-07 DIAGNOSIS — H25813 Combined forms of age-related cataract, bilateral: Secondary | ICD-10-CM | POA: Diagnosis not present

## 2017-06-07 DIAGNOSIS — H527 Unspecified disorder of refraction: Secondary | ICD-10-CM | POA: Diagnosis not present

## 2017-06-12 ENCOUNTER — Ambulatory Visit (INDEPENDENT_AMBULATORY_CARE_PROVIDER_SITE_OTHER): Payer: Medicare HMO | Admitting: Family Medicine

## 2017-06-12 ENCOUNTER — Encounter: Payer: Self-pay | Admitting: Family Medicine

## 2017-06-12 VITALS — BP 169/66 | HR 72 | Wt 183.0 lb

## 2017-06-12 DIAGNOSIS — D229 Melanocytic nevi, unspecified: Secondary | ICD-10-CM

## 2017-06-12 DIAGNOSIS — L821 Other seborrheic keratosis: Secondary | ICD-10-CM | POA: Diagnosis not present

## 2017-06-12 DIAGNOSIS — L989 Disorder of the skin and subcutaneous tissue, unspecified: Secondary | ICD-10-CM

## 2017-06-12 DIAGNOSIS — L570 Actinic keratosis: Secondary | ICD-10-CM | POA: Diagnosis not present

## 2017-06-12 DIAGNOSIS — L820 Inflamed seborrheic keratosis: Secondary | ICD-10-CM | POA: Diagnosis not present

## 2017-06-12 NOTE — Addendum Note (Signed)
Addended by: Teddy Spike on: 06/12/2017 05:26 PM   Modules accepted: Orders

## 2017-06-12 NOTE — Patient Instructions (Signed)
Please keep wounds covered until tomorrow around the same time. After that okay to remove bandages and get wet in the shower.  Do not scrub at the areas. Do not apply peroxide, alcohol, iodine, etc. Pat dry after the shower.  Then allowed to air dry for a couple of minutes.  Apply Vaseline twice a day.  Okay to keep covered for a couple of days. At any point the wounds are concerning in their appearance, you notice drainage, or you develop any fevers or spreading redness then please call the office.

## 2017-06-12 NOTE — Progress Notes (Signed)
Subjective:    Patient ID: Nancy Cisneros, female    DOB: 05-Apr-1941, 76 y.o.   MRN: [id number]  HPI 76 year old female comes in with 3 skin lesions that she would like me to look at today.  The one on her left forearm she had shown me at the previous office visit.  It looks suspicious for possible squamous cell versus basal cell so had asked her to return.  She reports that one has been itchy. Thick lesion in between the breasts and on the left buttock cheek that she would like removed as well.  They catch on her clothing and get easily irritated.  On her buttocks she says has been there for a couple of months.  And the one between the breast she says was removed years ago but seems to have come back.   Review of Systems     Objective:   Physical Exam Lesion on left inner breast: Extremely dark brown with a sharp defined border.  Lesion is papular with a rough texture.  5 x 8 mm Lesion on left buttock: Not is darkly pigmented but still a very rough textured distinct papular lesion.  10 x 10 mm Left forearm: A pink papular almost pearly lesion on the left posterior forearm.  6 x 6 mm       Assessment & Plan:  Shave Biopsy Procedure Note  Pre-operative Diagnosis: Seborrheic keratoses, irritated  Post-operative Diagnosis: same  Locations: Left buttock, left inner breast along the cleavage line   Indications: irritation  Anesthesia: Lidocaine 1% with epinephrine without added sodium bicarbonate  Procedure Details  Patient informed of the risks (including bleeding and infection) and benefits of the  procedure and Verbal informed consent obtained.  The lesion and surrounding area were given a sterile prep using chlorhexidine and draped in the usual sterile fashion. A scalpel was used to shave an area of skin approximately 1.1cm by 1.1cm on the left buttock.  Lesion on left breat area shaved 6 x 7 mm.  Hemostasis achieved with alumuninum chloride. Antibiotic ointment and a sterile  dressing applied.  The specimen was sent for pathologic examination. The patient tolerated the procedure well.  EBL: trace  Findings: Await pathology  Condition: Stable  Complications: 1.1.  Plan: 1. Instructed to keep the wound dry and covered for 24-48h and clean thereafter. 2. Warning signs of infection were reviewed.   3. Recommended that the patient use OTC acetaminophen as needed for pain.  4. Return PRN.    Shave Biopsy Procedure Note  Pre-operative Diagnosis: Suspicious lesion  Post-operative Diagnosis: same  Locations:left posterior forearm.   Indications: suspicious  Anesthesia: Lidocaine 1% with epinephrine without added sodium bicarbonate  Procedure Details  Patient informed of the risks (including bleeding and infection) and benefits of the  procedure and Verbal informed consent obtained.  The lesion and surrounding area were given a sterile prep using chlorhexidine and draped in the usual sterile fashion. A scalpel was used to shave an area of skin approximately 48mm by 21mm.  Hemostasis achieved with alumuninum chloride. Antibiotic ointment and a sterile dressing applied.  The specimen was sent for pathologic examination. The patient tolerated the procedure well.  EBL: trace   Findings: Await pathology  Condition: Stable  Complications: none.  Plan: 1. Instructed to keep the wound dry and covered for 24-48h and clean thereafter. 2. Warning signs of infection were reviewed.   3. Recommended that the patient use OTC acetaminophen as needed for pain.  4.  Return in prn  .

## 2017-06-13 DIAGNOSIS — J189 Pneumonia, unspecified organism: Secondary | ICD-10-CM | POA: Diagnosis not present

## 2017-06-22 ENCOUNTER — Other Ambulatory Visit: Payer: Self-pay | Admitting: *Deleted

## 2017-06-22 MED ORDER — IPRATROPIUM BROMIDE 0.02 % IN SOLN: RESPIRATORY_TRACT | 0 refills | Status: AC

## 2017-07-04 DIAGNOSIS — Z96651 Presence of right artificial knee joint: Secondary | ICD-10-CM | POA: Diagnosis not present

## 2017-07-14 DIAGNOSIS — J189 Pneumonia, unspecified organism: Secondary | ICD-10-CM | POA: Diagnosis not present

## 2017-08-02 DIAGNOSIS — R270 Ataxia, unspecified: Secondary | ICD-10-CM | POA: Diagnosis not present

## 2017-08-02 DIAGNOSIS — M5441 Lumbago with sciatica, right side: Secondary | ICD-10-CM | POA: Diagnosis not present

## 2017-08-02 DIAGNOSIS — M5442 Lumbago with sciatica, left side: Secondary | ICD-10-CM | POA: Diagnosis not present

## 2017-08-02 DIAGNOSIS — G603 Idiopathic progressive neuropathy: Secondary | ICD-10-CM | POA: Diagnosis not present

## 2017-08-02 DIAGNOSIS — Z79899 Other long term (current) drug therapy: Secondary | ICD-10-CM | POA: Diagnosis not present

## 2017-08-13 DIAGNOSIS — J189 Pneumonia, unspecified organism: Secondary | ICD-10-CM | POA: Diagnosis not present

## 2017-08-17 ENCOUNTER — Other Ambulatory Visit: Payer: Self-pay | Admitting: Family Medicine

## 2017-08-30 ENCOUNTER — Ambulatory Visit (INDEPENDENT_AMBULATORY_CARE_PROVIDER_SITE_OTHER): Payer: Medicare HMO | Admitting: Family Medicine

## 2017-08-30 ENCOUNTER — Encounter: Payer: Self-pay | Admitting: Family Medicine

## 2017-08-30 VITALS — BP 127/88 | HR 61 | Temp 98.3°F

## 2017-08-30 DIAGNOSIS — J449 Chronic obstructive pulmonary disease, unspecified: Secondary | ICD-10-CM

## 2017-08-30 DIAGNOSIS — J069 Acute upper respiratory infection, unspecified: Secondary | ICD-10-CM | POA: Diagnosis not present

## 2017-08-30 NOTE — Patient Instructions (Addendum)
Upper Respiratory Infection, Adult Most upper respiratory infections (URIs) are a viral infection of the air passages leading to the lungs. A URI affects the nose, throat, and upper air passages. The most common type of URI is nasopharyngitis and is typically referred to as "the common cold." URIs run their course and usually go away on their own. Most of the time, a URI does not require medical attention, but sometimes a bacterial infection in the upper airways can follow a viral infection. This is called a secondary infection. Sinus and middle ear infections are common types of secondary upper respiratory infections. Bacterial pneumonia can also complicate a URI. A URI can worsen asthma and chronic obstructive pulmonary disease (COPD). Sometimes, these complications can require emergency medical care and may be life threatening. What are the causes? Almost all URIs are caused by viruses. A virus is a type of germ and can spread from one person to another. What increases the risk? You may be at risk for a URI if:  You smoke.  You have chronic heart or lung disease.  You have a weakened defense (immune) system.  You are very young or very old.  You have nasal allergies or asthma.  You work in crowded or poorly ventilated areas.  You work in health care facilities or schools.  What are the signs or symptoms? Symptoms typically develop 2-3 days after you come in contact with a cold virus. Most viral URIs last 7-10 days. However, viral URIs from the influenza virus (flu virus) can last 14-18 days and are typically more severe. Symptoms may include:  Runny or stuffy (congested) nose.  Sneezing.  Cough.  Sore throat.  Headache.  Fatigue.  Fever.  Loss of appetite.  Pain in your forehead, behind your eyes, and over your cheekbones (sinus pain).  Muscle aches.  How is this diagnosed? Your health care provider may diagnose a URI by:  Physical exam.  Tests to check that your  symptoms are not due to another condition such as: ? Strep throat. ? Sinusitis. ? Pneumonia. ? Asthma.  How is this treated? A URI goes away on its own with time. It cannot be cured with medicines, but medicines may be prescribed or recommended to relieve symptoms. Medicines may help:  Reduce your fever.  Reduce your cough.  Relieve nasal congestion.  Follow these instructions at home:  Take medicines only as directed by your health care provider.  Gargle warm saltwater or take cough drops to comfort your throat as directed by your health care provider.  Use a warm mist humidifier or inhale steam from a shower to increase air moisture. This may make it easier to breathe.  Drink enough fluid to keep your urine clear or pale yellow.  Eat soups and other clear broths and maintain good nutrition.  Rest as needed.  Return to work when your temperature has returned to normal or as your health care provider advises. You may need to stay home longer to avoid infecting others. You can also use a face mask and careful hand washing to prevent spread of the virus.  Increase the usage of your inhaler if you have asthma.  Do not use any tobacco products, including cigarettes, chewing tobacco, or electronic cigarettes. If you need help quitting, ask your health care provider. How is this prevented? The best way to protect yourself from getting a cold is to practice good hygiene.  Avoid oral or hand contact with people with cold symptoms.  Wash your   hands often if contact occurs.  There is no clear evidence that vitamin C, vitamin E, echinacea, or exercise reduces the chance of developing a cold. However, it is always recommended to get plenty of rest, exercise, and practice good nutrition. Contact a health care provider if:  You are getting worse rather than better.  Your symptoms are not controlled by medicine.  You have chills.  You have worsening shortness of breath.  You have  brown or red mucus.  You have yellow or brown nasal discharge.  You have pain in your face, especially when you bend forward.  You have a fever.  You have swollen neck glands.  You have pain while swallowing.  You have white areas in the back of your throat. Get help right away if:  You have severe or persistent: ? Headache. ? Ear pain. ? Sinus pain. ? Chest pain.  You have chronic lung disease and any of the following: ? Wheezing. ? Prolonged cough. ? Coughing up blood. ? A change in your usual mucus.  You have a stiff neck.  You have changes in your: ? Vision. ? Hearing. ? Thinking. ? Mood. This information is not intended to replace advice given to you by your health care provider. Make sure you discuss any questions you have with your health care provider. Document Released: 02/01/2001 Document Revised: 04/10/2016 Document Reviewed: 11/13/2013 Elsevier Interactive Patient Education  2018 [employer]  

## 2017-08-30 NOTE — Progress Notes (Signed)
   Subjective:    Patient ID: Nancy Cisneros, female    DOB: 1941/05/13, 77 y.o.   MRN: [id number]  HPI 77 year old female with a history of COPD comes in today complaining of 2 days of scratchy throat cough and nasal congestion.  No fevers chills or sweats.  No GI symptoms.  She has not noticed any increase in shortness of breath or wheezing at least at this time.  Her husband has been sick for a week with very similar symptoms.  She is not currently taking any over-the-counter medications.  No worsening or alleviating factors.   Review of Systems     Objective:   Physical Exam  Constitutional: She is oriented to person, place, and time. She appears well-developed and well-nourished.  HENT:  Head: Normocephalic and atraumatic.  Right Ear: External ear normal.  Left Ear: External ear normal.  Nose: Nose normal.  Mouth/Throat: Oropharynx is clear and moist.  TMs and canals are clear.   Eyes: Conjunctivae and EOM are normal. Pupils are equal, round, and reactive to light.  Neck: Neck supple. No thyromegaly present.  Cardiovascular: Normal rate, regular rhythm and normal heart sounds.  Pulmonary/Chest: Effort normal and breath sounds normal. She has no wheezes.  Lymphadenopathy:    She has no cervical adenopathy.  Neurological: She is alert and oriented to person, place, and time.  Skin: Skin is warm and dry.  Psychiatric: She has a normal mood and affect.          Assessment & Plan:  Upper respiratory infection-likely viral.  No worsening of COPD acutely.  If she feels like she is getting worse before the weekend and please give [place] a call and we will treat with prednisone and azithromycin and/or doxycycline.  If she is not better in 1 week then please give [place] a call.  Continue to use inhalers regularly.  Make sure hydrating well.  Okay to use over-the-counter cough and cold medication.

## 2017-08-31 ENCOUNTER — Telehealth: Payer: Self-pay | Admitting: Family Medicine

## 2017-08-31 MED ORDER — AZITHROMYCIN 250 MG PO TABS: ORAL_TABLET | ORAL | 0 refills | Status: AC

## 2017-08-31 MED ORDER — PREDNISONE 20 MG PO TABS: 40.0000 mg | ORAL_TABLET | Freq: Every day | ORAL | 0 refills | Status: AC

## 2017-08-31 NOTE — Telephone Encounter (Signed)
Rx sent for zpack and prednisone, though this may still be a virus

## 2017-08-31 NOTE — Telephone Encounter (Signed)
Pt called clinic stating she was advised by PCP to contact if she was not feeling better, she is feeling worse. Looking at OV note, PCP may send in something. Will route.

## 2017-09-01 NOTE — Telephone Encounter (Signed)
Pt advised.

## 2017-09-13 DIAGNOSIS — J189 Pneumonia, unspecified organism: Secondary | ICD-10-CM | POA: Diagnosis not present

## 2017-09-21 DIAGNOSIS — R2689 Other abnormalities of gait and mobility: Secondary | ICD-10-CM | POA: Diagnosis not present

## 2017-09-21 DIAGNOSIS — G2581 Restless legs syndrome: Secondary | ICD-10-CM | POA: Diagnosis not present

## 2017-09-21 DIAGNOSIS — Z79899 Other long term (current) drug therapy: Secondary | ICD-10-CM | POA: Diagnosis not present

## 2017-09-21 DIAGNOSIS — M5412 Radiculopathy, cervical region: Secondary | ICD-10-CM | POA: Diagnosis not present

## 2017-09-21 DIAGNOSIS — M5417 Radiculopathy, lumbosacral region: Secondary | ICD-10-CM | POA: Diagnosis not present

## 2017-09-21 DIAGNOSIS — G603 Idiopathic progressive neuropathy: Secondary | ICD-10-CM | POA: Diagnosis not present

## 2017-09-21 DIAGNOSIS — G5603 Carpal tunnel syndrome, bilateral upper limbs: Secondary | ICD-10-CM | POA: Diagnosis not present

## 2017-10-05 ENCOUNTER — Encounter: Payer: Self-pay | Admitting: Family Medicine

## 2017-10-05 ENCOUNTER — Ambulatory Visit (INDEPENDENT_AMBULATORY_CARE_PROVIDER_SITE_OTHER): Payer: Medicare HMO | Admitting: Family Medicine

## 2017-10-05 VITALS — BP 134/62 | HR 59 | Wt 185.0 lb

## 2017-10-05 DIAGNOSIS — J0100 Acute maxillary sinusitis, unspecified: Secondary | ICD-10-CM | POA: Diagnosis not present

## 2017-10-05 MED ORDER — BUDESONIDE-FORMOTEROL FUMARATE 160-4.5 MCG/ACT IN AERO: 2.0000 | INHALATION_SPRAY | Freq: Two times a day (BID) | RESPIRATORY_TRACT | 3 refills | Status: DC

## 2017-10-05 MED ORDER — AZITHROMYCIN 250 MG PO TABS: ORAL_TABLET | ORAL | 0 refills | Status: AC

## 2017-10-05 NOTE — Progress Notes (Signed)
   Subjective:    Patient ID: Nancy Cisneros, female    DOB: 08/15/1941, 77 y.o.   MRN: [id number]  HPI 77 year old female comes in today complaining of sinus congestion and pressure for about 1 week.  She has been using Flonase amd ,Mucinex. + runny nose.  Pain on the left facial cheek. Even painful to wash her face. No fever, chills or sweats.  She has a history of COPD and reports that she has been using her Symbicort.  She does not report any significant cough or wheezing or shortness of breath.  She was actually seen for an upper respiratory infection January 9.   Review of Systems     Objective:   Physical Exam  Constitutional: She is oriented to person, place, and time. She appears well-developed and well-nourished.  HENT:  Head: Normocephalic and atraumatic.  Right Ear: External ear normal.  Left Ear: External ear normal.  Nose: Nose normal.  Mouth/Throat: Oropharynx is clear and moist.  TMs and canals are clear.   Eyes: Conjunctivae and EOM are normal. Pupils are equal, round, and reactive to light.  Neck: Neck supple. No thyromegaly present.  Cardiovascular: Normal rate, regular rhythm and normal heart sounds.  Pulmonary/Chest: Effort normal and breath sounds normal. She has no wheezes.  Lymphadenopathy:    She has no cervical adenopathy.  Neurological: She is alert and oriented to person, place, and time.  Skin: Skin is warm and dry.  Psychiatric: She has a normal mood and affect.        Assessment & Plan:  Acute left maxillary sinusitis-we will treat with azithromycin.  Recommend continue with Flonase which she just started 2 days ago and add a nasal saline rinse twice a day.  If not improving after 1 week then please give [place] a call.

## 2017-10-05 NOTE — Patient Instructions (Addendum)
Min nasal saline rinse twice a day once at bedtime and once in the morning and do your Flonase after the morning rinse. Please call if not improving after 5 days.

## 2017-10-14 DIAGNOSIS — J189 Pneumonia, unspecified organism: Secondary | ICD-10-CM | POA: Diagnosis not present

## 2017-10-16 ENCOUNTER — Ambulatory Visit (INDEPENDENT_AMBULATORY_CARE_PROVIDER_SITE_OTHER): Payer: Medicare HMO | Admitting: Family Medicine

## 2017-10-16 ENCOUNTER — Encounter: Payer: Self-pay | Admitting: Family Medicine

## 2017-10-16 ENCOUNTER — Telehealth: Payer: Self-pay

## 2017-10-16 VITALS — BP 151/66 | HR 67 | Ht 65.0 in | Wt 188.0 lb

## 2017-10-16 DIAGNOSIS — R531 Weakness: Secondary | ICD-10-CM | POA: Diagnosis not present

## 2017-10-16 DIAGNOSIS — R0989 Other specified symptoms and signs involving the circulatory and respiratory systems: Secondary | ICD-10-CM | POA: Diagnosis not present

## 2017-10-16 DIAGNOSIS — R202 Paresthesia of skin: Secondary | ICD-10-CM

## 2017-10-16 DIAGNOSIS — R4781 Slurred speech: Secondary | ICD-10-CM | POA: Diagnosis not present

## 2017-10-16 DIAGNOSIS — K529 Noninfective gastroenteritis and colitis, unspecified: Secondary | ICD-10-CM | POA: Diagnosis not present

## 2017-10-16 DIAGNOSIS — M25512 Pain in left shoulder: Secondary | ICD-10-CM | POA: Diagnosis not present

## 2017-10-16 DIAGNOSIS — R9082 White matter disease, unspecified: Secondary | ICD-10-CM | POA: Diagnosis not present

## 2017-10-16 DIAGNOSIS — R110 Nausea: Secondary | ICD-10-CM

## 2017-10-16 MED ORDER — LORAZEPAM 0.5 MG PO TABS: 0.5000 mg | ORAL_TABLET | Freq: Two times a day (BID) | ORAL | 0 refills | Status: DC | PRN

## 2017-10-16 NOTE — Telephone Encounter (Signed)
Nancy Cisneros is having shortness of breath, left side weakness and numbness. I advised patient to go to ED. She refused and will only see Dr Madilyn Fireman. She is scheduled for this afternoon.

## 2017-10-16 NOTE — Progress Notes (Signed)
Subjective:    Patient ID: Nancy Cisneros, female    DOB: 1940/11/26, 77 y.o.   MRN: [id number]  HPI 77 year old female comes in today complaining of episode of not feeling well on Friday.  She had actually just been seen for sinus infection on February 14 and was given a Z-Pak.  She finished that earlier in the week.  Though she now thinks that the pain that she was having on the left side of her face just between the lip and the nasal bridge is unrelated.  She still noticing that it is tender when she touches it or washes her face.  She does not have teeth she has dentures.  Nonetheless on Friday morning when she woke up she felt weak, numb and tingly on the left side of her body.  It was a left upper arm and left leg.  She also felt off balance and had a headache that day.  She did not check her blood pressure to see if it was high.  When she comes in here typically her blood pressures a little elevated but then when we recheck it it usually looks good.   She is worried she has had a mini-stroke.   She also reports that she is had problems with nausea and diarrhea for months.  She says almost every time she eats she gets nauseated or either she has diarrhea.  The only thing that she is able to eat but does not trigger this as popsicles.  History of cholecystectomy.  Review of Systems  Constitutional: Positive for appetite change. Negative for fever.  HENT: Negative for facial swelling.   Cardiovascular: Negative for chest pain.  Gastrointestinal: Negative for abdominal pain.  Skin: Negative for rash.  Neurological: Positive for dizziness, facial asymmetry, weakness, light-headedness, numbness and headaches. Negative for tremors and speech difficulty.      BP (!) 151/66   Pulse 67   Ht 5\' 5"  (1.651 m)   Wt 188 lb (85.3 kg)   SpO2 96%   BMI 31.28 kg/m     Allergies  Allergen Reactions  . Codeine     REACTION: trouble breathing  . Ramipril     REACTION: cough    Past Medical  History:  Diagnosis Date  . Adrenal adenoma    1 cm Left  . CAD (coronary artery disease)    Dr Prince Rome, [facility] cardiology  . COPD (chronic obstructive pulmonary disease) (HCC)    lung nodule- Dr Tilden Dome  . Diverticulosis of colon   . Hip pain, left   . Hyperlipidemia   . Hypertension     Past Surgical History:  Procedure Laterality Date  . Lynx mid urethral sling     Dr Beverley Fiedler    Social History   Socioeconomic History  . Marital status: Married    Spouse name: Not on file  . Number of children: Not on file  . Years of education: Not on file  . Highest education level: Not on file  Social Needs  . Financial resource strain: Not on file  . Food insecurity - worry: Not on file  . Food insecurity - inability: Not on file  . Transportation needs - medical: Not on file  . Transportation needs - non-medical: Not on file  Occupational History  . Not on file  Tobacco Use  . Smoking status: Former Smoker    Packs/day: 0.40    Last attempt to quit: 03/28/2016    Years since quitting:  1.5  . Smokeless tobacco: Never Used  Substance and Sexual Activity  . Alcohol use: No  . Drug use: Not on file  . Sexual activity: Yes  Other Topics Concern  . Not on file  Social History Narrative  . Not on file    Family History  Problem Relation Age of Onset  . Hyperlipidemia Mother   . Hypertension Mother   . Cancer Father        Oat cell lung CA  . Alcohol abuse Father     Outpatient Encounter Medications as of 10/16/2017  Medication Sig  . albuterol (PROAIR HFA) 108 (90 Base) MCG/ACT inhaler Inhale 2-4 puffs into the lungs every 6 (six) hours as needed.  Marland Kitchen albuterol (PROVENTIL) (2.5 MG/3ML) 0.083% nebulizer solution Take 3 mLs (2.5 mg total) by nebulization every 6 (six) hours as needed for wheezing or shortness of breath.  Marland Kitchen amLODipine (NORVASC) 5 MG tablet TAKE 1 TABLET EVERY DAY  . aspirin 81 MG tablet Take 81 mg by mouth daily.    . budesonide-formoterol (SYMBICORT)  160-4.5 MCG/ACT inhaler Inhale 2 puffs into the lungs 2 (two) times daily.  Marland Kitchen buPROPion (WELLBUTRIN XL) 150 MG 24 hr tablet   . esomeprazole (NEXIUM) 40 MG capsule TAKE 1 CAPSULE (40 MG TOTAL) BY MOUTH DAILY.  Marland Kitchen gabapentin (NEURONTIN) 300 MG capsule   . HYDROcodone-acetaminophen (NORCO) 10-325 MG tablet 1 PO Q 6 HOURS PRN FOR 30 DAYS  . ipratropium (ATROVENT) 0.02 % nebulizer solution TAKE 2.5 MLS (0.5 MG TOTAL) BY NEBULIZATION 4 (FOUR) TIMES DAILY.  Marland Kitchen losartan (COZAAR) 25 MG tablet Take 1 tablet (25 mg total) by mouth daily.  . methocarbamol (ROBAXIN) 750 MG tablet Take 750 mg by mouth 2 (two) times daily.  . metoprolol (LOPRESSOR) 100 MG tablet Take 1 tablet (100 mg total) by mouth 2 (two) times daily.  Marland Kitchen rOPINIRole (REQUIP) 0.5 MG tablet   . sertraline (ZOLOFT) 100 MG tablet TAKE 1 TABLET EVERY DAY  . LORazepam (ATIVAN) 0.5 MG tablet Take 1-2 tablets (0.5-1 mg total) by mouth 2 (two) times daily as needed for anxiety.   No facility-administered encounter medications on file as of 10/16/2017.          Objective:   Physical Exam  Constitutional: She is oriented to person, place, and time. She appears well-developed and well-nourished.  HENT:  Head: Normocephalic and atraumatic.  Right Ear: External ear normal.  Left Ear: External ear normal.  Nose: Nose normal.  Mouth/Throat: Oropharynx is clear and moist.  Eyes: Conjunctivae and EOM are normal. Pupils are equal, round, and reactive to light.  Neck: Neck supple. No thyromegaly present.  Cardiovascular: Normal rate, regular rhythm and normal heart sounds.  Pulmonary/Chest: Effort normal and breath sounds normal. She has no wheezes.  Musculoskeletal:  Strength is 5 out of 5 in the upper extremities and lower extremities.  Patellar reflex 2+ on the left and almost absent on the right that she is had knee replacement.  Biceps reflex 2+ on the upper extremities bilaterally.  Left shoulder with some difficulty getting past 90 degrees but  she was able to do it.  Tender over the upper trapezius muscle.  Also tender over the upper arm over the lateral arm and the bicep muscle.  Lymphadenopathy:    She has no cervical adenopathy.  Neurological: She is alert and oriented to person, place, and time. She displays normal reflexes. No cranial nerve deficit. She exhibits normal muscle tone. Coordination normal.  Skin: Skin is  warm and dry.  Psychiatric: She has a normal mood and affect. Her behavior is normal.        Assessment & Plan:  Numbness tingling on the left side of her body arm and lower leg-at this point I think we should move forward with a stat MRI for further evaluation to evaluate for possible stroke, mass or other lesions.  On physical exam she does not have any true significant weakness on the left side though she is having some difficulty fully extending her left shoulder which may be related more to a true shoulder joint issue.  We will go ahead and get labs today.  Left shoulder pain-she is having difficulty raising her shoulder above 90 degrees but she is able to do so with some discomfort.  She may have more of a rotator cuff injury or even bursitis.  We can address at a follow-up appointment.  Nausea/diarrhea-again we will do some blood work.  We will go ahead and refer to GI for further workup and evaluation.  He does not have a gallbladder.  Differential could include gastritis or ulcer as well as irritable bowel syndrome.  She could have some type of persistent viral or bacterial infection that has been going on for months at this point.  Consider further workup with stool culture and check for C. Difficile.  Facial pressure - may need to call her dentist for further evaluation.

## 2017-10-16 NOTE — Telephone Encounter (Signed)
OK, noted. Thank you.

## 2017-10-17 ENCOUNTER — Telehealth: Payer: Self-pay | Admitting: Family Medicine

## 2017-10-17 DIAGNOSIS — R110 Nausea: Secondary | ICD-10-CM | POA: Diagnosis not present

## 2017-10-17 DIAGNOSIS — K529 Noninfective gastroenteritis and colitis, unspecified: Secondary | ICD-10-CM | POA: Diagnosis not present

## 2017-10-17 LAB — COMPLETE METABOLIC PANEL WITH GFR
AG RATIO: 1.7 (calc) (ref 1.0–2.5)
ALBUMIN MSPROF: 4 g/dL (ref 3.6–5.1)
ALKALINE PHOSPHATASE (APISO): 83 U/L (ref 33–130)
ALT: 9 U/L (ref 6–29)
AST: 14 U/L (ref 10–35)
BUN / CREAT RATIO: 11 (calc) (ref 6–22)
BUN: 10 mg/dL (ref 7–25)
CO2: 27 mmol/L (ref 20–32)
CREATININE: 0.95 mg/dL — AB (ref 0.60–0.93)
Calcium: 9.6 mg/dL (ref 8.6–10.4)
Chloride: 104 mmol/L (ref 98–110)
GFR, EST NON AFRICAN AMERICAN: 57 mL/min/{1.73_m2} — AB (ref >=60)
GFR, Est African American: 66 mL/min/{1.73_m2} (ref >=60)
GLOBULIN: 2.3 g/dL (ref 1.9–3.7)
Glucose, Bld: 99 mg/dL (ref 65–99)
Potassium: 4.3 mmol/L (ref 3.5–5.3)
SODIUM: 137 mmol/L (ref 135–146)
Total Bilirubin: 0.5 mg/dL (ref 0.2–1.2)
Total Protein: 6.3 g/dL (ref 6.1–8.1)

## 2017-10-17 LAB — AMYLASE: AMYLASE: 29 U/L (ref 21–101)

## 2017-10-17 LAB — CBC WITH DIFFERENTIAL/PLATELET
Basophils Absolute: 30 cells/uL (ref 0–200)
Basophils Relative: 0.5 %
EOS ABS: 162 {cells}/uL (ref 15–500)
Eosinophils Relative: 2.7 %
HCT: 35.9 % (ref 35.0–45.0)
Hemoglobin: 12.4 g/dL (ref 11.7–15.5)
Lymphs Abs: 1698 cells/uL (ref 850–3900)
MCH: 30.1 pg (ref 27.0–33.0)
MCHC: 34.5 g/dL (ref 32.0–36.0)
MCV: 87.1 fL (ref 80.0–100.0)
MONOS PCT: 7.5 %
MPV: 11.4 fL (ref 7.5–12.5)
NEUTROS PCT: 61 %
Neutro Abs: 3660 cells/uL (ref 1500–7800)
PLATELETS: 242 10*3/uL (ref 140–400)
RBC: 4.12 10*6/uL (ref 3.80–5.10)
RDW: 13.9 % (ref 11.0–15.0)
TOTAL LYMPHOCYTE: 28.3 %
WBC: 6 10*3/uL (ref 3.8–10.8)
WBCMIX: 450 {cells}/uL (ref 200–950)

## 2017-10-17 LAB — LIPASE: Lipase: 6 U/L — ABNORMAL LOW (ref 7–60)

## 2017-10-17 NOTE — Telephone Encounter (Signed)
Pt would like Tonya to call her before the end of day. She has called a few times today. She has concerns about things that are going on with her body. She was seen in office on 10/16/17.

## 2017-10-17 NOTE — Telephone Encounter (Signed)
[name], [name], called and states Nancy Cisneros is complaining of trouble speaking. She is asking about the results of the MRI.   Dr Madilyn Fireman stated the MRI was mostly normal except some age related changes. She recommends patient have her labs done. They are still pending in the EMR. She also recommends decreasing either, Hydrocodone, Robaxin and/or Gabapentin. This could be causing some sudation. I advised [name] of the recommendations. She states she believes her mom is going today to have the labs drawn.

## 2017-10-18 ENCOUNTER — Telehealth: Payer: Self-pay

## 2017-10-18 DIAGNOSIS — I25119 Atherosclerotic heart disease of native coronary artery with unspecified angina pectoris: Secondary | ICD-10-CM

## 2017-10-18 DIAGNOSIS — I10 Essential (primary) hypertension: Secondary | ICD-10-CM

## 2017-10-18 NOTE — Telephone Encounter (Signed)
Referral placed. Attempted to contact Pt, no answer.

## 2017-10-18 NOTE — Telephone Encounter (Signed)
OK to place order, can use CAD and HTN dx.

## 2017-10-18 NOTE — Telephone Encounter (Signed)
Patient called in requesting a cardiology referral to Dr. Florene Glen.

## 2017-10-18 NOTE — Addendum Note (Signed)
Addended by: Huel Cote on: 10/18/2017 03:30 PM   Modules accepted: Orders

## 2017-10-18 NOTE — Telephone Encounter (Signed)
Spoke w/pt.Nancy Cisneros, [place]

## 2017-10-19 LAB — CLOSTRIDIUM DIFFICILE TOXIN B, QUALITATIVE, REAL-TIME PCR: CDIFFPCR: NOT DETECTED

## 2017-10-20 DIAGNOSIS — R194 Change in bowel habit: Secondary | ICD-10-CM | POA: Diagnosis not present

## 2017-10-20 DIAGNOSIS — R195 Other fecal abnormalities: Secondary | ICD-10-CM | POA: Diagnosis not present

## 2017-10-20 DIAGNOSIS — R1013 Epigastric pain: Secondary | ICD-10-CM | POA: Diagnosis not present

## 2017-10-20 DIAGNOSIS — R112 Nausea with vomiting, unspecified: Secondary | ICD-10-CM | POA: Diagnosis not present

## 2017-10-22 LAB — STOOL CULTURE
MICRO NUMBER: 90256455
MICRO NUMBER: 90256456
MICRO NUMBER:: 90256457
SHIGA RESULT:: NOT DETECTED
SPECIMEN QUALITY: ADEQUATE
SPECIMEN QUALITY: ADEQUATE
SPECIMEN QUALITY:: ADEQUATE

## 2017-10-24 ENCOUNTER — Telehealth: Payer: Self-pay

## 2017-10-24 DIAGNOSIS — R1013 Epigastric pain: Secondary | ICD-10-CM | POA: Diagnosis not present

## 2017-10-24 DIAGNOSIS — K3189 Other diseases of stomach and duodenum: Secondary | ICD-10-CM | POA: Diagnosis not present

## 2017-10-24 DIAGNOSIS — R112 Nausea with vomiting, unspecified: Secondary | ICD-10-CM | POA: Diagnosis not present

## 2017-10-24 DIAGNOSIS — R238 Other skin changes: Secondary | ICD-10-CM

## 2017-10-24 NOTE — Telephone Encounter (Signed)
OK for referral for venous [place].

## 2017-10-24 NOTE — Telephone Encounter (Signed)
Nancy Cisneros called and would like a referral to Dermatology for the lesion on her lip. Please advise.

## 2017-10-25 NOTE — Telephone Encounter (Signed)
Referral placed. Patient advised. 

## 2017-10-26 DIAGNOSIS — I2510 Atherosclerotic heart disease of native coronary artery without angina pectoris: Secondary | ICD-10-CM | POA: Diagnosis not present

## 2017-10-26 DIAGNOSIS — I10 Essential (primary) hypertension: Secondary | ICD-10-CM | POA: Diagnosis not present

## 2017-10-26 DIAGNOSIS — M79602 Pain in left arm: Secondary | ICD-10-CM | POA: Diagnosis not present

## 2017-11-02 DIAGNOSIS — R202 Paresthesia of skin: Secondary | ICD-10-CM | POA: Diagnosis not present

## 2017-11-02 DIAGNOSIS — C44311 Basal cell carcinoma of skin of nose: Secondary | ICD-10-CM | POA: Diagnosis not present

## 2017-11-02 DIAGNOSIS — G603 Idiopathic progressive neuropathy: Secondary | ICD-10-CM | POA: Diagnosis not present

## 2017-11-02 DIAGNOSIS — L821 Other seborrheic keratosis: Secondary | ICD-10-CM | POA: Diagnosis not present

## 2017-11-02 DIAGNOSIS — G2581 Restless legs syndrome: Secondary | ICD-10-CM | POA: Diagnosis not present

## 2017-11-02 DIAGNOSIS — G5603 Carpal tunnel syndrome, bilateral upper limbs: Secondary | ICD-10-CM | POA: Diagnosis not present

## 2017-11-02 DIAGNOSIS — M5417 Radiculopathy, lumbosacral region: Secondary | ICD-10-CM | POA: Diagnosis not present

## 2017-11-02 DIAGNOSIS — C4491 Basal cell carcinoma of skin, unspecified: Secondary | ICD-10-CM | POA: Insufficient documentation

## 2017-11-08 DIAGNOSIS — K7689 Other specified diseases of liver: Secondary | ICD-10-CM | POA: Diagnosis not present

## 2017-11-08 DIAGNOSIS — Z9049 Acquired absence of other specified parts of digestive tract: Secondary | ICD-10-CM | POA: Diagnosis not present

## 2017-11-08 DIAGNOSIS — N281 Cyst of kidney, acquired: Secondary | ICD-10-CM | POA: Diagnosis not present

## 2017-11-08 DIAGNOSIS — R1013 Epigastric pain: Secondary | ICD-10-CM | POA: Diagnosis not present

## 2017-11-09 DIAGNOSIS — M79602 Pain in left arm: Secondary | ICD-10-CM | POA: Diagnosis not present

## 2017-11-09 DIAGNOSIS — R9431 Abnormal electrocardiogram [ECG] [EKG]: Secondary | ICD-10-CM | POA: Diagnosis not present

## 2017-11-09 DIAGNOSIS — I440 Atrioventricular block, first degree: Secondary | ICD-10-CM | POA: Diagnosis not present

## 2017-11-11 DIAGNOSIS — J189 Pneumonia, unspecified organism: Secondary | ICD-10-CM | POA: Diagnosis not present

## 2017-11-21 ENCOUNTER — Telehealth: Payer: Self-pay | Admitting: Family Medicine

## 2017-11-21 MED ORDER — NYSTATIN 100000 UNIT/ML MT SUSP
5.0000 mL | Freq: Four times a day (QID) | OROMUCOSAL | 0 refills | Status: DC
Start: 2017-11-21 — End: 2018-05-24

## 2017-11-21 NOTE — Telephone Encounter (Signed)
Pt advised.

## 2017-11-21 NOTE — Telephone Encounter (Signed)
Rx sent 

## 2017-11-21 NOTE — Telephone Encounter (Signed)
Pt left VM requesting Rx for Nystatin mouthwash. Pt reports she had this Rx 2 years ago for a sore in her mouth, and now she has another one. Will route.

## 2017-11-28 DIAGNOSIS — I2510 Atherosclerotic heart disease of native coronary artery without angina pectoris: Secondary | ICD-10-CM | POA: Diagnosis not present

## 2017-11-28 DIAGNOSIS — I10 Essential (primary) hypertension: Secondary | ICD-10-CM | POA: Diagnosis not present

## 2017-12-12 DIAGNOSIS — J189 Pneumonia, unspecified organism: Secondary | ICD-10-CM | POA: Diagnosis not present

## 2018-01-11 DIAGNOSIS — J189 Pneumonia, unspecified organism: Secondary | ICD-10-CM | POA: Diagnosis not present

## 2018-01-25 DIAGNOSIS — M5441 Lumbago with sciatica, right side: Secondary | ICD-10-CM | POA: Diagnosis not present

## 2018-01-25 DIAGNOSIS — G2581 Restless legs syndrome: Secondary | ICD-10-CM | POA: Diagnosis not present

## 2018-01-25 DIAGNOSIS — G8929 Other chronic pain: Secondary | ICD-10-CM | POA: Diagnosis not present

## 2018-01-25 DIAGNOSIS — Z79899 Other long term (current) drug therapy: Secondary | ICD-10-CM | POA: Diagnosis not present

## 2018-01-25 DIAGNOSIS — M5442 Lumbago with sciatica, left side: Secondary | ICD-10-CM | POA: Diagnosis not present

## 2018-01-25 DIAGNOSIS — G5603 Carpal tunnel syndrome, bilateral upper limbs: Secondary | ICD-10-CM | POA: Diagnosis not present

## 2018-01-25 DIAGNOSIS — M5412 Radiculopathy, cervical region: Secondary | ICD-10-CM | POA: Diagnosis not present

## 2018-01-26 ENCOUNTER — Other Ambulatory Visit: Payer: Self-pay | Admitting: Family Medicine

## 2018-01-26 NOTE — Telephone Encounter (Signed)
Patient last had this since January. Please advise

## 2018-02-01 ENCOUNTER — Other Ambulatory Visit: Payer: Self-pay | Admitting: Family Medicine

## 2018-02-06 DIAGNOSIS — I10 Essential (primary) hypertension: Secondary | ICD-10-CM | POA: Diagnosis not present

## 2018-02-06 DIAGNOSIS — R52 Pain, unspecified: Secondary | ICD-10-CM | POA: Diagnosis not present

## 2018-02-06 DIAGNOSIS — M779 Enthesopathy, unspecified: Secondary | ICD-10-CM | POA: Diagnosis not present

## 2018-02-07 ENCOUNTER — Telehealth: Payer: Self-pay | Admitting: Family Medicine

## 2018-02-07 DIAGNOSIS — M79672 Pain in left foot: Secondary | ICD-10-CM

## 2018-02-07 NOTE — Telephone Encounter (Signed)
OK to place referral to [facility]. Than you

## 2018-02-07 NOTE — Telephone Encounter (Signed)
Dr. Arthur Holms from [facility] called stating Nancy Cisneros would like to be seen there for her left foot pain. Due to her insurance she is required to have a referral from her PCP. If you are agree can you please place the referral for podiatry for left foot pain so I can send to clinic for Ms. Didonato.   Thank you

## 2018-02-08 NOTE — Telephone Encounter (Signed)
Referral placed.

## 2018-02-11 DIAGNOSIS — J189 Pneumonia, unspecified organism: Secondary | ICD-10-CM | POA: Diagnosis not present

## 2018-02-20 DIAGNOSIS — M25572 Pain in left ankle and joints of left foot: Secondary | ICD-10-CM | POA: Diagnosis not present

## 2018-02-20 DIAGNOSIS — M779 Enthesopathy, unspecified: Secondary | ICD-10-CM | POA: Diagnosis not present

## 2018-02-26 DIAGNOSIS — M779 Enthesopathy, unspecified: Secondary | ICD-10-CM | POA: Diagnosis not present

## 2018-02-26 DIAGNOSIS — M25572 Pain in left ankle and joints of left foot: Secondary | ICD-10-CM | POA: Diagnosis not present

## 2018-03-07 DIAGNOSIS — J449 Chronic obstructive pulmonary disease, unspecified: Secondary | ICD-10-CM | POA: Diagnosis not present

## 2018-03-07 DIAGNOSIS — I2510 Atherosclerotic heart disease of native coronary artery without angina pectoris: Secondary | ICD-10-CM | POA: Diagnosis not present

## 2018-03-07 DIAGNOSIS — M25572 Pain in left ankle and joints of left foot: Secondary | ICD-10-CM | POA: Diagnosis not present

## 2018-03-07 DIAGNOSIS — Z981 Arthrodesis status: Secondary | ICD-10-CM | POA: Diagnosis not present

## 2018-03-07 DIAGNOSIS — I10 Essential (primary) hypertension: Secondary | ICD-10-CM | POA: Diagnosis not present

## 2018-03-13 DIAGNOSIS — J189 Pneumonia, unspecified organism: Secondary | ICD-10-CM | POA: Diagnosis not present

## 2018-03-13 DIAGNOSIS — M25572 Pain in left ankle and joints of left foot: Secondary | ICD-10-CM | POA: Diagnosis not present

## 2018-03-13 DIAGNOSIS — M779 Enthesopathy, unspecified: Secondary | ICD-10-CM | POA: Diagnosis not present

## 2018-04-13 DIAGNOSIS — I10 Essential (primary) hypertension: Secondary | ICD-10-CM | POA: Diagnosis not present

## 2018-04-13 DIAGNOSIS — M779 Enthesopathy, unspecified: Secondary | ICD-10-CM | POA: Diagnosis not present

## 2018-04-13 DIAGNOSIS — Z96651 Presence of right artificial knee joint: Secondary | ICD-10-CM | POA: Diagnosis not present

## 2018-04-13 DIAGNOSIS — J189 Pneumonia, unspecified organism: Secondary | ICD-10-CM | POA: Diagnosis not present

## 2018-04-13 DIAGNOSIS — R52 Pain, unspecified: Secondary | ICD-10-CM | POA: Diagnosis not present

## 2018-05-03 DIAGNOSIS — M5412 Radiculopathy, cervical region: Secondary | ICD-10-CM | POA: Diagnosis not present

## 2018-05-03 DIAGNOSIS — G8929 Other chronic pain: Secondary | ICD-10-CM | POA: Diagnosis not present

## 2018-05-03 DIAGNOSIS — G5603 Carpal tunnel syndrome, bilateral upper limbs: Secondary | ICD-10-CM | POA: Diagnosis not present

## 2018-05-03 DIAGNOSIS — M5441 Lumbago with sciatica, right side: Secondary | ICD-10-CM | POA: Diagnosis not present

## 2018-05-03 DIAGNOSIS — G2581 Restless legs syndrome: Secondary | ICD-10-CM | POA: Diagnosis not present

## 2018-05-03 DIAGNOSIS — M5442 Lumbago with sciatica, left side: Secondary | ICD-10-CM | POA: Diagnosis not present

## 2018-05-14 DIAGNOSIS — J189 Pneumonia, unspecified organism: Secondary | ICD-10-CM | POA: Diagnosis not present

## 2018-05-24 ENCOUNTER — Encounter: Payer: Self-pay | Admitting: Family Medicine

## 2018-05-24 ENCOUNTER — Ambulatory Visit (INDEPENDENT_AMBULATORY_CARE_PROVIDER_SITE_OTHER): Payer: Medicare HMO | Admitting: Family Medicine

## 2018-05-24 VITALS — BP 136/70 | HR 85 | Ht 65.0 in | Wt 183.0 lb

## 2018-05-24 DIAGNOSIS — I10 Essential (primary) hypertension: Secondary | ICD-10-CM

## 2018-05-24 DIAGNOSIS — G4733 Obstructive sleep apnea (adult) (pediatric): Secondary | ICD-10-CM | POA: Diagnosis not present

## 2018-05-24 DIAGNOSIS — H9192 Unspecified hearing loss, left ear: Secondary | ICD-10-CM | POA: Diagnosis not present

## 2018-05-24 DIAGNOSIS — H938X2 Other specified disorders of left ear: Secondary | ICD-10-CM | POA: Diagnosis not present

## 2018-05-24 DIAGNOSIS — J410 Simple chronic bronchitis: Secondary | ICD-10-CM | POA: Diagnosis not present

## 2018-05-24 MED ORDER — PREDNISONE 20 MG PO TABS: 40.0000 mg | ORAL_TABLET | Freq: Every day | ORAL | 0 refills | Status: DC

## 2018-05-24 MED ORDER — AMBULATORY NON FORMULARY MEDICATION: 0 refills | Status: DC

## 2018-05-24 NOTE — Progress Notes (Signed)
Subjective:    Patient ID: Nancy Cisneros, female    DOB: 1940/10/17, 77 y.o.   MRN: [id number]  HPI  77 yo female comes in today for left ear fullness.  She went to have her hearing aid adjusted and they told her that she had a lot of wax in her left ear.  She went home and tried to do some self irrigations and said that she was able to remove some on her own.  But it just continues to feel full and most like the ear is clogged she says it almost feels like there is still water stuck in it.  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   Hypertension- Pt denies chest pain, SOB, dizziness, or heart palpitations.  Taking meds as directed w/o problems.  Denies medication side effects.    F/U COPD -reports that she is actually been doing really well she is been off the Symbicort for several months.  She says she rarely uses her albuterol.    In fact she says she also quit using her CPAP machine.  Just feels like she is breathing much better and does not really need it anymore.   Review of Systems  BP 136/70   Pulse 85   Ht 5\' 5"  (1.651 m)   Wt 183 lb (83 kg)   SpO2 94%   BMI 30.45 kg/m     Allergies  Allergen Reactions  . Codeine     REACTION: trouble breathing  . Ramipril     REACTION: cough    Past Medical History:  Diagnosis Date  . Adrenal adenoma    1 cm Left  . CAD (coronary artery disease)    Dr Prince Rome, [facility] cardiology  . COPD (chronic obstructive pulmonary disease) (HCC)    lung nodule- Dr Tilden Dome  . Diverticulosis of colon   . Hip pain, left   . Hyperlipidemia   . Hypertension     Past Surgical History:  Procedure Laterality Date  . Lynx mid urethral sling     Dr Beverley Fiedler    Social History   Socioeconomic History  . Marital status: Married    Spouse name: Not on file  . Number of children: Not on file  . Years of education: Not on file  . Highest education level: Not on file  Occupational History   . Not on file  Social Needs  . Financial resource strain: Not on file  . Food insecurity:    Worry: Not on file    Inability: Not on file  . Transportation needs:    Medical: Not on file    Non-medical: Not on file  Tobacco Use  . Smoking status: Former Smoker    Packs/day: 0.40    Last attempt to quit: 03/28/2016    Years since quitting: 2.1  . Smokeless tobacco: Never Used  Substance and Sexual Activity  . Alcohol use: No  . Drug use: Not on file  . Sexual activity: Yes  Lifestyle  . Physical activity:    Days per week: Not on file    Minutes per session: Not on file  . Stress: Not on file  Relationships  . Social connections:    Talks on phone: Not on file    Gets together: Not on file    Attends religious service: Not on file    Active member of club or organization: Not on file  Attends meetings of clubs or organizations: Not on file    Relationship status: Not on file  . Intimate partner violence:    Fear of current or ex partner: Not on file    Emotionally abused: Not on file    Physically abused: Not on file    Forced sexual activity: Not on file  Other Topics Concern  . Not on file  Social History Narrative  . Not on file    Family History  Problem Relation Age of Onset  . Hyperlipidemia Mother   . Hypertension Mother   . Cancer Father        Oat cell lung CA  . Alcohol abuse Father     Outpatient Encounter Medications as of 05/24/2018  Medication Sig  . albuterol (PROAIR HFA) 108 (90 Base) MCG/ACT inhaler Inhale 2-4 puffs into the lungs every 6 (six) hours as needed.  Marland Kitchen albuterol (PROVENTIL) (2.5 MG/3ML) 0.083% nebulizer solution Take 3 mLs (2.5 mg total) by nebulization every 6 (six) hours as needed for wheezing or shortness of breath.  Marland Kitchen amLODipine (NORVASC) 5 MG tablet TAKE 1 TABLET EVERY DAY  . aspirin 81 MG tablet Take 81 mg by mouth daily.    Marland Kitchen buPROPion (WELLBUTRIN XL) 150 MG 24 hr tablet   . esomeprazole (NEXIUM) 40 MG capsule TAKE 1  CAPSULE (40 MG TOTAL) BY MOUTH DAILY.  Marland Kitchen gabapentin (NEURONTIN) 300 MG capsule   . ipratropium (ATROVENT) 0.02 % nebulizer solution TAKE 2.5 MLS (0.5 MG TOTAL) BY NEBULIZATION 4 (FOUR) TIMES DAILY.  Marland Kitchen losartan (COZAAR) 25 MG tablet TAKE 1 TABLET (25 MG TOTAL) BY MOUTH DAILY.  . meloxicam (MOBIC) 15 MG tablet Take 15 mg by mouth every morning.  . methocarbamol (ROBAXIN) 750 MG tablet Take 750 mg by mouth 2 (two) times daily.  . metoprolol (LOPRESSOR) 100 MG tablet Take 1 tablet (100 mg total) by mouth 2 (two) times daily.  Marland Kitchen rOPINIRole (REQUIP) 0.5 MG tablet   . sertraline (ZOLOFT) 100 MG tablet TAKE 1 TABLET EVERY DAY  . [DISCONTINUED] budesonide-formoterol (SYMBICORT) 160-4.5 MCG/ACT inhaler Inhale 2 puffs into the lungs 2 (two) times daily.  . [DISCONTINUED] HYDROcodone-acetaminophen (NORCO) 10-325 MG tablet 1 PO Q 6 HOURS PRN FOR 30 DAYS  . [DISCONTINUED] LORazepam (ATIVAN) 0.5 MG tablet Take 1-2 tablets (0.5-1 mg total) by mouth 2 (two) times daily as needed for anxiety.  . [DISCONTINUED] nystatin (MYCOSTATIN) 100000 UNIT/ML suspension Take 5 mLs (500,000 Units total) by mouth 4 (four) times daily. Swish and swallow  . AMBULATORY NON FORMULARY MEDICATION Medication Name: Home sleep study through [place]. Dx snoring and OSA  . predniSONE (DELTASONE) 20 MG tablet Take 2 tablets (40 mg total) by mouth daily with breakfast.   No facility-administered encounter medications on file as of 05/24/2018.          Objective:   Physical Exam  Constitutional: She is oriented to person, place, and time. She appears well-developed and well-nourished.  HENT:  Head: Normocephalic and atraumatic.  Right Ear: External ear normal.  Left Ear: External ear normal.  Tympanic membranes bilaterally are clear.  There is a little tiny circle at the base of the left tympanic membrane but I do not see a tear or perforation in the right tympanic membrane.  Cardiovascular: Normal rate, regular rhythm and normal heart  sounds.  Pulmonary/Chest: Effort normal and breath sounds normal.  Neurological: She is alert and oriented to person, place, and time.  Skin: Skin is warm and dry.  Psychiatric:  She has a normal mood and affect. Her behavior is normal.       Assessment & Plan:  Left ear fullness-the ear is clear of wax and then tympanic membrane looks normal.  There is a small little bubble at the base.  Manometry was normal.  We will treat with oral prednisone if this does not completely resolve her symptoms then asked her to call me back in a week and let me know and we will refer her to ENT for further evaluation.  HTN - Well controlled. Continue current regimen. Due for CMP and lipids.   Follow up in  6 months.    COPD - Stable.   no recent flares or exacerbations.  She is no longer using her Symbicort.  She says since she quit smoking in 2017,  is made a big difference in her breathing.  Obstructive of sleep apnea-we will order home sleep study through [place].  Her initial test was positive for mild apnea.  She really feels that her symptoms have resolved.  I think it is worth retesting her before we completely stop the CPAP.

## 2018-05-25 ENCOUNTER — Telehealth: Payer: Self-pay | Admitting: *Deleted

## 2018-05-25 NOTE — Telephone Encounter (Signed)
Order for home sleep study faxed,confirmation received.Marland KitchenMarland KitchenElouise Munroe, [place]

## 2018-06-05 ENCOUNTER — Encounter: Payer: Self-pay | Admitting: Family Medicine

## 2018-06-05 ENCOUNTER — Ambulatory Visit (INDEPENDENT_AMBULATORY_CARE_PROVIDER_SITE_OTHER): Payer: Medicare HMO | Admitting: Family Medicine

## 2018-06-05 ENCOUNTER — Telehealth: Payer: Self-pay

## 2018-06-05 VITALS — BP 137/67 | HR 85 | Ht 65.0 in | Wt 181.0 lb

## 2018-06-05 DIAGNOSIS — R5383 Other fatigue: Secondary | ICD-10-CM | POA: Diagnosis not present

## 2018-06-05 DIAGNOSIS — R490 Dysphonia: Secondary | ICD-10-CM | POA: Diagnosis not present

## 2018-06-05 DIAGNOSIS — R112 Nausea with vomiting, unspecified: Secondary | ICD-10-CM | POA: Diagnosis not present

## 2018-06-05 DIAGNOSIS — R799 Abnormal finding of blood chemistry, unspecified: Secondary | ICD-10-CM | POA: Diagnosis not present

## 2018-06-05 DIAGNOSIS — R200 Anesthesia of skin: Secondary | ICD-10-CM

## 2018-06-05 MED ORDER — LORAZEPAM 0.5 MG PO TABS: 0.5000 mg | ORAL_TABLET | Freq: Once | ORAL | 0 refills | Status: DC | PRN

## 2018-06-05 MED ORDER — SUCRALFATE 1 GM/10ML PO SUSP: 1.0000 g | Freq: Three times a day (TID) | ORAL | 1 refills | Status: DC

## 2018-06-05 NOTE — Progress Notes (Signed)
Subjective:    Patient ID: Nancy Cisneros, female    DOB: [birth year], 77 y.o.   MRN: [id number]  HPI 77 year old female with a hx of GERD comes in today complaining of fatigue and voice hoarseness x 2 weeks. Marland Kitchen  She was actually here about 12 days ago complaining of left ear fullness and we actually irrigated that ear. No fever, chills or sweats.  No sinus drainage or ST. No problems swallowing.  When she eats she gets nauseated and vomits.  She says she is getting nauseated every time she eats but does not necessarily vomit every time.  She is taking occ nexium.  She denies any diarrhea constipation or significant change in bowels.  No blood.  She also reports left-sided facial numbness is also been going on for about 2 weeks.  She says it is constant it never completely goes away.  She is able to move her face but says it just feels like it is numb to touch and sometimes will even catch herself drooling on the left side of her mouth.  She just does not feel well in general just feels more fatigued than usual.  Hard to the voice hoarseness this is also been going on for couple of weeks.  See note above.  She does have a history of reflux.  But just takes her Nexium as needed.  No dysphagia.  No sore throat or pain.  No recent cold symptoms.   Review of Systems  BP 137/67   Pulse 85   Ht 5\' 5"  (1.651 m)   Wt 181 lb (82.1 kg)   SpO2 97%   BMI 30.12 kg/m     Allergies  Allergen Reactions  . Codeine     REACTION: trouble breathing  . Ramipril     REACTION: cough    Past Medical History:  Diagnosis Date  . Adrenal adenoma    1 cm Left  . CAD (coronary artery disease)    Dr Prince Rome, [facility] cardiology  . COPD (chronic obstructive pulmonary disease) (HCC)    lung nodule- Dr Tilden Dome  . Diverticulosis of colon   . Hip pain, left   . Hyperlipidemia   . Hypertension     Past Surgical History:  Procedure Laterality Date  . Lynx mid urethral sling     Dr Beverley Fiedler    Social  History   Socioeconomic History  . Marital status: Married    Spouse name: Not on file  . Number of children: Not on file  . Years of education: Not on file  . Highest education level: Not on file  Occupational History  . Not on file  Social Needs  . Financial resource strain: Not on file  . Food insecurity:    Worry: Not on file    Inability: Not on file  . Transportation needs:    Medical: Not on file    Non-medical: Not on file  Tobacco Use  . Smoking status: Former Smoker    Packs/day: 0.40    Last attempt to quit: 03/28/2016    Years since quitting: 2.1  . Smokeless tobacco: Never Used  Substance and Sexual Activity  . Alcohol use: No  . Drug use: Not on file  . Sexual activity: Yes  Lifestyle  . Physical activity:    Days per week: Not on file    Minutes per session: Not on file  . Stress: Not on file  Relationships  . Social connections:  Talks on phone: Not on file    Gets together: Not on file    Attends religious service: Not on file    Active member of club or organization: Not on file    Attends meetings of clubs or organizations: Not on file    Relationship status: Not on file  . Intimate partner violence:    Fear of current or ex partner: Not on file    Emotionally abused: Not on file    Physically abused: Not on file    Forced sexual activity: Not on file  Other Topics Concern  . Not on file  Social History Narrative  . Not on file    Family History  Problem Relation Age of Onset  . Hyperlipidemia Mother   . Hypertension Mother   . Cancer Father        Oat cell lung CA  . Alcohol abuse Father     Outpatient Encounter Medications as of 06/05/2018  Medication Sig  . albuterol (PROAIR HFA) 108 (90 Base) MCG/ACT inhaler Inhale 2-4 puffs into the lungs every 6 (six) hours as needed.  Marland Kitchen albuterol (PROVENTIL) (2.5 MG/3ML) 0.083% nebulizer solution Take 3 mLs (2.5 mg total) by nebulization every 6 (six) hours as needed for wheezing or shortness  of breath.  . AMBULATORY NON FORMULARY MEDICATION Medication Name: Home sleep study through [place]. Dx snoring and OSA  . amLODipine (NORVASC) 5 MG tablet TAKE 1 TABLET EVERY DAY  . aspirin 81 MG tablet Take 81 mg by mouth daily.    Marland Kitchen buPROPion (WELLBUTRIN XL) 150 MG 24 hr tablet   . esomeprazole (NEXIUM) 40 MG capsule TAKE 1 CAPSULE (40 MG TOTAL) BY MOUTH DAILY.  Marland Kitchen gabapentin (NEURONTIN) 300 MG capsule   . ipratropium (ATROVENT) 0.02 % nebulizer solution TAKE 2.5 MLS (0.5 MG TOTAL) BY NEBULIZATION 4 (FOUR) TIMES DAILY.  Marland Kitchen losartan (COZAAR) 25 MG tablet TAKE 1 TABLET (25 MG TOTAL) BY MOUTH DAILY.  . meloxicam (MOBIC) 15 MG tablet Take 15 mg by mouth every morning.  . methocarbamol (ROBAXIN) 750 MG tablet Take 750 mg by mouth 2 (two) times daily.  . metoprolol (LOPRESSOR) 100 MG tablet Take 1 tablet (100 mg total) by mouth 2 (two) times daily.  Marland Kitchen rOPINIRole (REQUIP) 0.5 MG tablet   . sertraline (ZOLOFT) 100 MG tablet TAKE 1 TABLET EVERY DAY  . LORazepam (ATIVAN) 0.5 MG tablet Take 1-2 tablets (0.5-1 mg total) by mouth once as needed for up to 1 dose for anxiety or sedation.  . sucralfate (CARAFATE) 1 GM/10ML suspension Take 10 mLs (1 g total) by mouth 4 (four) times daily -  with meals and at bedtime.  . [DISCONTINUED] predniSONE (DELTASONE) 20 MG tablet Take 2 tablets (40 mg total) by mouth daily with breakfast.   No facility-administered encounter medications on file as of 06/05/2018.           Objective:   Physical Exam  Constitutional: She is oriented to person, place, and time. She appears well-developed and well-nourished.  HENT:  Head: Normocephalic and atraumatic.  Right Ear: External ear normal.  Left Ear: External ear normal.  Nose: Nose normal.  Mouth/Throat: Oropharynx is clear and moist.  TMs and canals are clear.   Eyes: Pupils are equal, round, and reactive to light. Conjunctivae and EOM are normal.  Neck: Neck supple. No thyromegaly present.  Cardiovascular: Normal  rate, regular rhythm and normal heart sounds.  Pulmonary/Chest: Effort normal and breath sounds normal. She has no wheezes.  Lymphadenopathy:    She has no cervical adenopathy.  Neurological: She is alert and oriented to person, place, and time. No cranial nerve deficit. Coordination normal.  Skin: Skin is warm and dry.  Psychiatric: She has a normal mood and affect. Her behavior is normal. Judgment and thought content normal.       Assessment & Plan:  Voice hoarseness -could be reflux related, vocal cord polyp, vocal cord paralysis, from vomiting, cancer etc.  For now we will have her take her Nexium daily and add Carafate.  Will refer her to ENT and hopefully we can get her in within a week.  Fatigue -some additional labs including CBC and TSH today.  Left sided facial numbness -recent for possible stroke.  Cranial nerve function is normal but it just feels numb to touch.  Will get MRI with and without contrast for further work-up for possible stroke.  Nausea and vomiting -could be GERD/gastritis related-we will have her take her Nexium daily and add Carafate.  If not improving by the end of the week then will refer to GI for further work-up.

## 2018-06-05 NOTE — Telephone Encounter (Signed)
Cost of Carafate is too much. It cost $200. She would like a different medication. Please advise.

## 2018-06-05 NOTE — Patient Instructions (Signed)
Your Nexium daily about 20 minutes before your first meal of the day. Take the Carafate right before you eat each meal and at bedtime. If the nausea and vomiting is not improving by the end of the week then please give [place] a call back. Try to call and get her MRI scheduled here locally.

## 2018-06-05 NOTE — Telephone Encounter (Signed)
Please call the pharmacy and see if the pills would be cheaper?  When I entered in the medication it looks like the pills were not covered but unknown past experience the pills are usually cheaper.  Please verify that I can always send over new prescription.

## 2018-06-06 ENCOUNTER — Ambulatory Visit: Payer: Medicare HMO | Admitting: Family Medicine

## 2018-06-06 DIAGNOSIS — R200 Anesthesia of skin: Secondary | ICD-10-CM | POA: Diagnosis not present

## 2018-06-06 DIAGNOSIS — I6782 Cerebral ischemia: Secondary | ICD-10-CM | POA: Diagnosis not present

## 2018-06-06 LAB — LIPID PANEL
CHOL/HDL RATIO: 4.9 (calc) (ref <5.0)
Cholesterol: 215 mg/dL — ABNORMAL HIGH (ref <200)
HDL: 44 mg/dL — ABNORMAL LOW (ref >50)
LDL Cholesterol (Calc): 130 mg/dL (calc) — ABNORMAL HIGH
Non-HDL Cholesterol (Calc): 171 mg/dL (calc) — ABNORMAL HIGH (ref <130)
Triglycerides: 266 mg/dL — ABNORMAL HIGH (ref <150)

## 2018-06-06 LAB — CBC WITH DIFFERENTIAL/PLATELET
BASOS ABS: 30 {cells}/uL (ref 0–200)
Basophils Relative: 0.4 %
EOS PCT: 1.7 %
Eosinophils Absolute: 129 cells/uL (ref 15–500)
HCT: 39.2 % (ref 35.0–45.0)
Hemoglobin: 13.7 g/dL (ref 11.7–15.5)
Lymphs Abs: 2014 cells/uL (ref 850–3900)
MCH: 30.6 pg (ref 27.0–33.0)
MCHC: 34.9 g/dL (ref 32.0–36.0)
MCV: 87.5 fL (ref 80.0–100.0)
MONOS PCT: 6.2 %
MPV: 11.3 fL (ref 7.5–12.5)
NEUTROS PCT: 65.2 %
Neutro Abs: 4955 cells/uL (ref 1500–7800)
Platelets: 218 10*3/uL (ref 140–400)
RBC: 4.48 10*6/uL (ref 3.80–5.10)
RDW: 13.3 % (ref 11.0–15.0)
Total Lymphocyte: 26.5 %
WBC mixed population: 471 cells/uL (ref 200–950)
WBC: 7.6 10*3/uL (ref 3.8–10.8)

## 2018-06-06 LAB — COMPLETE METABOLIC PANEL WITH GFR
AG RATIO: 1.8 (calc) (ref 1.0–2.5)
ALBUMIN MSPROF: 3.9 g/dL (ref 3.6–5.1)
ALT: 24 U/L (ref 6–29)
AST: 14 U/L (ref 10–35)
Alkaline phosphatase (APISO): 86 U/L (ref 33–130)
BUN / CREAT RATIO: 14 (calc) (ref 6–22)
BUN: 14 mg/dL (ref 7–25)
CALCIUM: 9.7 mg/dL (ref 8.6–10.4)
CO2: 29 mmol/L (ref 20–32)
Chloride: 102 mmol/L (ref 98–110)
Creat: 1 mg/dL — ABNORMAL HIGH (ref 0.60–0.93)
GFR, EST AFRICAN AMERICAN: 62 mL/min/{1.73_m2} (ref >=60)
GFR, EST NON AFRICAN AMERICAN: 54 mL/min/{1.73_m2} — AB (ref >=60)
GLOBULIN: 2.2 g/dL (ref 1.9–3.7)
Glucose, Bld: 87 mg/dL (ref 65–99)
POTASSIUM: 4.6 mmol/L (ref 3.5–5.3)
SODIUM: 137 mmol/L (ref 135–146)
TOTAL PROTEIN: 6.1 g/dL (ref 6.1–8.1)
Total Bilirubin: 0.4 mg/dL (ref 0.2–1.2)

## 2018-06-06 LAB — TSH: TSH: 0.79 mIU/L (ref 0.40–4.50)

## 2018-06-06 LAB — B12 AND FOLATE PANEL
FOLATE: 5.5 ng/mL
VITAMIN B 12: 249 pg/mL (ref 200–1100)

## 2018-06-06 MED ORDER — SUCRALFATE 1 G PO TABS: 1.0000 g | ORAL_TABLET | Freq: Three times a day (TID) | ORAL | 1 refills | Status: DC

## 2018-06-06 NOTE — Telephone Encounter (Signed)
Please let pt knwo tabs sent to pharmacy. Hopefuly will be chearper. Can crush the tabs and mix with small amout of water to make a solution

## 2018-06-06 NOTE — Telephone Encounter (Signed)
Called the pharmacy, they are unable to provide a price without an Rx but the tablets do have a generic option so it should be cheaper

## 2018-06-06 NOTE — Telephone Encounter (Signed)
Left VM with update.  

## 2018-06-07 ENCOUNTER — Encounter: Payer: Self-pay | Admitting: Family Medicine

## 2018-06-07 MED ORDER — CYANOCOBALAMIN 1000 MCG/ML IJ SOLN: 1000.0000 ug | INTRAMUSCULAR | 0 refills | Status: DC

## 2018-06-07 NOTE — Telephone Encounter (Signed)
Been sent to [facility].  After 1 month we will need to recheck her blood level and if she is doing well then at that point we will space the shots to once a month.

## 2018-06-11 NOTE — Telephone Encounter (Signed)
Faxed to Boyd Kerbs D MD  Doctor in [place], [place]  Address: [address], [place], [place] [address]  Phone: [phone] Fax: [phone]

## 2018-06-12 DIAGNOSIS — R490 Dysphonia: Secondary | ICD-10-CM | POA: Diagnosis not present

## 2018-06-12 DIAGNOSIS — H903 Sensorineural hearing loss, bilateral: Secondary | ICD-10-CM | POA: Diagnosis not present

## 2018-06-12 DIAGNOSIS — G500 Trigeminal neuralgia: Secondary | ICD-10-CM | POA: Diagnosis not present

## 2018-06-13 DIAGNOSIS — H527 Unspecified disorder of refraction: Secondary | ICD-10-CM | POA: Diagnosis not present

## 2018-06-13 DIAGNOSIS — H25813 Combined forms of age-related cataract, bilateral: Secondary | ICD-10-CM | POA: Diagnosis not present

## 2018-06-13 DIAGNOSIS — J189 Pneumonia, unspecified organism: Secondary | ICD-10-CM | POA: Diagnosis not present

## 2018-06-18 DIAGNOSIS — R0902 Hypoxemia: Secondary | ICD-10-CM | POA: Diagnosis not present

## 2018-06-18 DIAGNOSIS — J159 Unspecified bacterial pneumonia: Secondary | ICD-10-CM | POA: Diagnosis not present

## 2018-06-18 DIAGNOSIS — I2510 Atherosclerotic heart disease of native coronary artery without angina pectoris: Secondary | ICD-10-CM | POA: Diagnosis not present

## 2018-06-18 DIAGNOSIS — Z9981 Dependence on supplemental oxygen: Secondary | ICD-10-CM | POA: Diagnosis not present

## 2018-06-18 DIAGNOSIS — Z720 Tobacco use: Secondary | ICD-10-CM | POA: Diagnosis not present

## 2018-06-18 DIAGNOSIS — I10 Essential (primary) hypertension: Secondary | ICD-10-CM | POA: Diagnosis not present

## 2018-06-18 DIAGNOSIS — R918 Other nonspecific abnormal finding of lung field: Secondary | ICD-10-CM | POA: Diagnosis not present

## 2018-06-18 DIAGNOSIS — J441 Chronic obstructive pulmonary disease with (acute) exacerbation: Secondary | ICD-10-CM | POA: Diagnosis not present

## 2018-06-18 DIAGNOSIS — G4733 Obstructive sleep apnea (adult) (pediatric): Secondary | ICD-10-CM | POA: Diagnosis not present

## 2018-06-18 DIAGNOSIS — J440 Chronic obstructive pulmonary disease with acute lower respiratory infection: Secondary | ICD-10-CM | POA: Diagnosis not present

## 2018-06-18 DIAGNOSIS — R000 Tachycardia, unspecified: Secondary | ICD-10-CM | POA: Diagnosis not present

## 2018-06-18 DIAGNOSIS — J432 Centrilobular emphysema: Secondary | ICD-10-CM | POA: Diagnosis not present

## 2018-06-18 DIAGNOSIS — R0602 Shortness of breath: Secondary | ICD-10-CM | POA: Diagnosis not present

## 2018-06-18 DIAGNOSIS — J9601 Acute respiratory failure with hypoxia: Secondary | ICD-10-CM | POA: Diagnosis not present

## 2018-06-18 DIAGNOSIS — F17290 Nicotine dependence, other tobacco product, uncomplicated: Secondary | ICD-10-CM | POA: Diagnosis not present

## 2018-06-18 NOTE — Telephone Encounter (Signed)
Ok to cancel ENT referral. Please let her know when done.

## 2018-06-19 ENCOUNTER — Ambulatory Visit: Payer: Medicare HMO | Admitting: Family Medicine

## 2018-06-21 ENCOUNTER — Ambulatory Visit: Payer: Medicare HMO | Admitting: Family Medicine

## 2018-06-21 NOTE — Progress Notes (Deleted)
   Subjective:    Patient ID: Nancy Cisneros, female    DOB: 1940-11-16, 77 y.o.   MRN: [id number]  HPI  MRI was negative.    Review of Systems     Objective:   Physical Exam        Assessment & Plan:

## 2018-06-26 ENCOUNTER — Other Ambulatory Visit: Payer: Self-pay | Admitting: *Deleted

## 2018-06-26 NOTE — Patient Outreach (Signed)
[facility]) Care Management  06/26/2018  Jolena Kittle Kina April 30, 1941 [id number]  Transition of Care Referral   Referral Date: 06/25/18 Referral Source:  [facility] inpatient referral  Date of Admission: 06/18/18  Diagnosis: COPD with acute exacerbation Date of Discharge:  on 06/21/18.  Facility: [place]: [place] attempt # 1 successful at the home number  Patient is able to verify HIPAA Reviewed and addressed Transitional of care referral with patient  Mrs Yoho informed CM she was doing good but tired She denies medical issues for her tiredness when assessed. She reports she had seen Dr Madilyn Fireman prior to her hospitalization for similar s/s CM encouraged rest, to call CM prn, to call 24 hr RN line prn or MD She stated she would and voiced appreciation of the call from CM   Transition of care assessment completed    Social: Mrs Taha lives at home with her husband and states he assists with transportation. She reports being independent with all her ADLs and iADLs   Conditions: COPD with acute exacerbation, HTN, GERD, neuropathy, CAD, osteoarthritis & meniscus tear of the right knee,  syncope, tobacco abuse, mitral regurgitation, diverticulosis, peripheral neuropathy, right rotator cuff syndrome, basal cell carcinoma, HDL, major depressive disorder, GAD Falls None  Medications: denies concerns with taking medications as prescribed, affording medications, side effects of medications and questions about medications  Oxygen at home use prn  Has been encouraged to stop smoking by MDs but continues to vape  Appointments: primary MD., Dr Beatrice Lecher  f/u is on 07/05/18  Dr Raenette Rover, pulmonologist   Advance directives: Denies need for assist with advance directives   Consent: [facility] RN CM reviewed [facility] services with patient. Patient gave verbal consent for services. Advised patient that other post discharge calls  may occur to assess how the patient is doing following the recent hospitalization. Patient voiced understanding and was appreciative of f/u call.  Plan: [facility] RN CM will close case at this time as patient has been assessed and no needs identified.   [facility] RN CM sent a successful outreach letter as discussed with [facility] brochure enclosed for review  Pt encouraged to return a call to [place] CM prn RN CM provided patient with [facility] 24 hr Nurse Line contact [phone].   Kimberly L. Lavina Hamman, RN, BSN, [place] Management Care Coordinator Direct Number [phone] Mobile number [phone]  Main [facility] number [phone] Fax number [phone]

## 2018-06-28 DIAGNOSIS — M5442 Lumbago with sciatica, left side: Secondary | ICD-10-CM | POA: Diagnosis not present

## 2018-06-28 DIAGNOSIS — R202 Paresthesia of skin: Secondary | ICD-10-CM | POA: Diagnosis not present

## 2018-06-28 DIAGNOSIS — G5603 Carpal tunnel syndrome, bilateral upper limbs: Secondary | ICD-10-CM | POA: Diagnosis not present

## 2018-06-28 DIAGNOSIS — G603 Idiopathic progressive neuropathy: Secondary | ICD-10-CM | POA: Diagnosis not present

## 2018-06-28 DIAGNOSIS — M5441 Lumbago with sciatica, right side: Secondary | ICD-10-CM | POA: Diagnosis not present

## 2018-06-28 DIAGNOSIS — M5412 Radiculopathy, cervical region: Secondary | ICD-10-CM | POA: Diagnosis not present

## 2018-07-05 ENCOUNTER — Encounter: Payer: Self-pay | Admitting: Family Medicine

## 2018-07-05 ENCOUNTER — Ambulatory Visit (INDEPENDENT_AMBULATORY_CARE_PROVIDER_SITE_OTHER): Payer: Medicare HMO | Admitting: Family Medicine

## 2018-07-05 VITALS — BP 142/78 | HR 83 | Ht 65.0 in | Wt 186.0 lb

## 2018-07-05 DIAGNOSIS — T50905A Adverse effect of unspecified drugs, medicaments and biological substances, initial encounter: Secondary | ICD-10-CM

## 2018-07-05 DIAGNOSIS — I10 Essential (primary) hypertension: Secondary | ICD-10-CM

## 2018-07-05 DIAGNOSIS — J441 Chronic obstructive pulmonary disease with (acute) exacerbation: Secondary | ICD-10-CM | POA: Diagnosis not present

## 2018-07-05 MED ORDER — UMECLIDINIUM-VILANTEROL 62.5-25 MCG/INH IN AEPB: 1.0000 | INHALATION_SPRAY | Freq: Every day | RESPIRATORY_TRACT | 0 refills | Status: DC

## 2018-07-05 MED ORDER — VARENICLINE TARTRATE 0.5 MG X 11 & 1 MG X 42 PO MISC: ORAL | 0 refills | Status: DC

## 2018-07-05 NOTE — Progress Notes (Signed)
Subjective:    Patient ID: Nancy Cisneros, female    DOB: 25-Sep-1940, 77 y.o.   MRN: [id number]  HPI 77 year old female here today for follow-up for recent hospitalization for COPD exacerbation-she actually went to the emergency department on October 28.  She reported that she been coughing and feeling short of breath for about a month.  At actually seen her earlier in the month and she complained about a raspy voice and some facial numbness but at that time did not have a significant cough.  She was admitted and discharged home on October 31.  She was treated with typical COPD exacerbation protocols with nebulizer treatments and antibiotics.  While there they thought that she may have had a side effect to Solu-Medrol and did add it to her allergy list.  They did encourage that she follow-up in 1 week with primary care just to make sure that she was improving.  Chest x-ray was normal.  Is here today and reports that she is actually feeling much better.  Her oxygen levels up to 99%.  They did encourage her to wear her oxygen and then wean it back as she is doing well.  She does wear it at bedtime.   Review of Systems  BP (!) 142/78   Pulse 83   Ht 5\' 5"  (1.651 m)   Wt 186 lb (84.4 kg)   SpO2 99%   BMI 30.95 kg/m     Allergies  Allergen Reactions  . Codeine     REACTION: trouble breathing  . Ramipril     REACTION: cough  . Solu-Medrol [Methylprednisolone Acetate] Other (See Comments)    nausea and hives    Past Medical History:  Diagnosis Date  . Adrenal adenoma    1 cm Left  . CAD (coronary artery disease)    Dr Prince Rome, [facility] cardiology  . COPD (chronic obstructive pulmonary disease) (HCC)    lung nodule- Dr Tilden Dome  . Diverticulosis of colon   . Hip pain, left   . Hyperlipidemia   . Hypertension     Past Surgical History:  Procedure Laterality Date  . Lynx mid urethral sling     Dr Beverley Fiedler    Social History   Socioeconomic History  . Marital status: Married   Spouse name: Not on file  . Number of children: Not on file  . Years of education: Not on file  . Highest education level: Not on file  Occupational History  . Not on file  Social Needs  . Financial resource strain: Not on file  . Food insecurity:    Worry: Not on file    Inability: Not on file  . Transportation needs:    Medical: Not on file    Non-medical: Not on file  Tobacco Use  . Smoking status: Former Smoker    Packs/day: 0.40    Last attempt to quit: 03/28/2016    Years since quitting: 2.2  . Smokeless tobacco: Never Used  Substance and Sexual Activity  . Alcohol use: No  . Drug use: Not on file  . Sexual activity: Yes  Lifestyle  . Physical activity:    Days per week: Not on file    Minutes per session: Not on file  . Stress: Not on file  Relationships  . Social connections:    Talks on phone: Not on file    Gets together: Not on file    Attends religious service: Not on file  Active member of club or organization: Not on file    Attends meetings of clubs or organizations: Not on file    Relationship status: Not on file  . Intimate partner violence:    Fear of current or ex partner: Not on file    Emotionally abused: Not on file    Physically abused: Not on file    Forced sexual activity: Not on file  Other Topics Concern  . Not on file  Social History Narrative  . Not on file    Family History  Problem Relation Age of Onset  . Hyperlipidemia Mother   . Hypertension Mother   . Cancer Father        Oat cell lung CA  . Alcohol abuse Father     Outpatient Encounter Medications as of 07/05/2018  Medication Sig  . albuterol (PROAIR HFA) 108 (90 Base) MCG/ACT inhaler Inhale 2-4 puffs into the lungs every 6 (six) hours as needed.  Marland Kitchen albuterol (PROVENTIL) (2.5 MG/3ML) 0.083% nebulizer solution Take 3 mLs (2.5 mg total) by nebulization every 6 (six) hours as needed for wheezing or shortness of breath.  Marland Kitchen amLODipine (NORVASC) 5 MG tablet TAKE 1 TABLET EVERY  DAY  . aspirin 81 MG tablet Take 81 mg by mouth daily.    Marland Kitchen buPROPion (WELLBUTRIN XL) 150 MG 24 hr tablet   . cyanocobalamin (,VITAMIN B-12,) 1000 MCG/ML injection Inject 1 mL (1,000 mcg total) into the muscle every 7 (seven) days.  Marland Kitchen esomeprazole (NEXIUM) 40 MG capsule TAKE 1 CAPSULE (40 MG TOTAL) BY MOUTH DAILY.  Marland Kitchen gabapentin (NEURONTIN) 300 MG capsule Take 300 mg by mouth at bedtime. Take 3 capsules (900 mg) by mouth at bedtime  . ipratropium (ATROVENT) 0.02 % nebulizer solution TAKE 2.5 MLS (0.5 MG TOTAL) BY NEBULIZATION 4 (FOUR) TIMES DAILY.  Marland Kitchen LORazepam (ATIVAN) 0.5 MG tablet Take 1-2 tablets (0.5-1 mg total) by mouth once as needed for up to 1 dose for anxiety or sedation.  Marland Kitchen losartan (COZAAR) 25 MG tablet TAKE 1 TABLET (25 MG TOTAL) BY MOUTH DAILY.  . meloxicam (MOBIC) 15 MG tablet Take 15 mg by mouth every morning.  . methocarbamol (ROBAXIN) 750 MG tablet Take 750 mg by mouth 2 (two) times daily.  . metoprolol (LOPRESSOR) 100 MG tablet Take 1 tablet (100 mg total) by mouth 2 (two) times daily.  Marland Kitchen rOPINIRole (REQUIP) 0.5 MG tablet   . sertraline (ZOLOFT) 100 MG tablet TAKE 1 TABLET EVERY DAY  . sucralfate (CARAFATE) 1 g tablet Take 1 tablet (1 g total) by mouth 4 (four) times daily -  with meals and at bedtime.  . valACYclovir (VALTREX) 1000 MG tablet Take 1 g by mouth 3 (three) times daily.  . [DISCONTINUED] AMBULATORY NON FORMULARY MEDICATION Medication Name: Home sleep study through [facility]. Dx snoring and OSA  . umeclidinium-vilanterol (ANORO ELLIPTA) 62.5-25 MCG/INH AEPB Inhale 1 puff into the lungs daily.  . varenicline (CHANTIX PAK) 0.5 MG X 11 & 1 MG X 42 tablet Take one 0.5 mg tablet by mouth once daily for 3 days, then increase to one 0.5 mg tablet twice daily for 4 days, then increase to one 1 mg tablet twice daily.  . [DISCONTINUED] gabapentin (NEURONTIN) 300 MG capsule    No facility-administered encounter medications on file as of 07/05/2018.           Objective:    Physical Exam  Constitutional: She is oriented to person, place, and time. She appears well-developed and well-nourished.  HENT:  Head: Normocephalic and atraumatic.  Right Ear: External ear normal.  Left Ear: External ear normal.  Nose: Nose normal.  Mouth/Throat: Oropharynx is clear and moist.  TMs and canals are clear.   Eyes: Pupils are equal, round, and reactive to light. Conjunctivae and EOM are normal.  Neck: Neck supple. No thyromegaly present.  Cardiovascular: Normal rate, regular rhythm and normal heart sounds.  Pulmonary/Chest: Effort normal and breath sounds normal. She has no wheezes.  Lymphadenopathy:    She has no cervical adenopathy.  Neurological: She is alert and oriented to person, place, and time.  Skin: Skin is warm and dry.  Psychiatric: She has a normal mood and affect.          Assessment & Plan:  COPD exacerbation.  She reports that she is about 50% better today.  She is actually not on a controller inhaler so was able to get her a coupon card to get 1 month 3 of the Norco.  She will do 1 inhalation in the morning.  Okay to still continue to use albuterol as needed.  She does have oxygen at home that she wears at night and she can use that during the daytime if needed.  She has been monitoring her pulse ox at home.  Hypertention-repeat blood pressure better.  Borderline elevated but will keep an eye on it.  I will see her back in 1 month.  We can recheck at that time.  Medication intolerance-added Solu-Medrol to her allergy list while in the hospital she said she got an infusion of Solu-Medrol and developed sudden nausea and hives it was discontinued and they added it to her allergy list.

## 2018-07-06 ENCOUNTER — Encounter: Payer: Self-pay | Admitting: Family Medicine

## 2018-07-06 DIAGNOSIS — H25813 Combined forms of age-related cataract, bilateral: Secondary | ICD-10-CM | POA: Diagnosis not present

## 2018-07-06 DIAGNOSIS — H02834 Dermatochalasis of left upper eyelid: Secondary | ICD-10-CM | POA: Diagnosis not present

## 2018-07-06 DIAGNOSIS — H02831 Dermatochalasis of right upper eyelid: Secondary | ICD-10-CM | POA: Diagnosis not present

## 2018-07-06 DIAGNOSIS — H43813 Vitreous degeneration, bilateral: Secondary | ICD-10-CM | POA: Diagnosis not present

## 2018-07-06 DIAGNOSIS — H52223 Regular astigmatism, bilateral: Secondary | ICD-10-CM | POA: Diagnosis not present

## 2018-07-06 DIAGNOSIS — H527 Unspecified disorder of refraction: Secondary | ICD-10-CM | POA: Diagnosis not present

## 2018-07-07 ENCOUNTER — Encounter: Payer: Self-pay | Admitting: Family Medicine

## 2018-07-10 ENCOUNTER — Other Ambulatory Visit: Payer: Self-pay | Admitting: Family Medicine

## 2018-07-14 DIAGNOSIS — J189 Pneumonia, unspecified organism: Secondary | ICD-10-CM | POA: Diagnosis not present

## 2018-07-23 ENCOUNTER — Other Ambulatory Visit: Payer: Self-pay | Admitting: Family Medicine

## 2018-08-03 DIAGNOSIS — G4733 Obstructive sleep apnea (adult) (pediatric): Secondary | ICD-10-CM | POA: Diagnosis not present

## 2018-08-03 DIAGNOSIS — R0602 Shortness of breath: Secondary | ICD-10-CM | POA: Diagnosis not present

## 2018-08-04 DIAGNOSIS — G4733 Obstructive sleep apnea (adult) (pediatric): Secondary | ICD-10-CM | POA: Diagnosis not present

## 2018-08-04 DIAGNOSIS — R0602 Shortness of breath: Secondary | ICD-10-CM | POA: Diagnosis not present

## 2018-08-07 ENCOUNTER — Other Ambulatory Visit: Payer: Self-pay | Admitting: Family Medicine

## 2018-08-07 ENCOUNTER — Ambulatory Visit (INDEPENDENT_AMBULATORY_CARE_PROVIDER_SITE_OTHER): Payer: Medicare HMO | Admitting: Family Medicine

## 2018-08-07 ENCOUNTER — Encounter: Payer: Self-pay | Admitting: Family Medicine

## 2018-08-07 VITALS — BP 135/77 | HR 80 | Ht 65.0 in | Wt 185.0 lb

## 2018-08-07 DIAGNOSIS — I10 Essential (primary) hypertension: Secondary | ICD-10-CM

## 2018-08-07 DIAGNOSIS — R05 Cough: Secondary | ICD-10-CM | POA: Diagnosis not present

## 2018-08-07 DIAGNOSIS — R059 Cough, unspecified: Secondary | ICD-10-CM

## 2018-08-07 MED ORDER — LOSARTAN POTASSIUM 25 MG PO TABS: 25.0000 mg | ORAL_TABLET | Freq: Every day | ORAL | 1 refills | Status: DC

## 2018-08-07 MED ORDER — METOPROLOL TARTRATE 100 MG PO TABS: 100.0000 mg | ORAL_TABLET | Freq: Two times a day (BID) | ORAL | 1 refills | Status: DC

## 2018-08-07 MED ORDER — AMLODIPINE BESYLATE 5 MG PO TABS: 5.0000 mg | ORAL_TABLET | Freq: Every day | ORAL | 1 refills | Status: DC

## 2018-08-07 MED ORDER — SERTRALINE HCL 100 MG PO TABS: 150.0000 mg | ORAL_TABLET | Freq: Every day | ORAL | 1 refills | Status: DC

## 2018-08-07 NOTE — Progress Notes (Signed)
Subjective:    Patient ID: Nancy Cisneros, female    DOB: [birth year], 77 y.o.   MRN: [id number]  HPI 77 year old female comes in today for follow-up of COPD exacerbation.  She is here today for lung recheck. She denies SOB but feels like she still has some congestion.    She feels nauseated before she eats.  She is on Chantix but says the nausea started before she started the medication. She will get dry heaves.  She has just been taking her protonix PRN.    Wonders if part of it may be because her stress levels are really high right now.  Unfortunately she has a son who is still battling addiction.  She is completely emptied her savings accounts to send him to rehab multiple times.  And unfortunately he is still using.  CPAP testing has been done.   Review of Systems  BP 135/77   Pulse 80   Ht 5\' 5"  (1.651 m)   Wt 185 lb (83.9 kg)   SpO2 99%   BMI 30.79 kg/m     Allergies  Allergen Reactions  . Codeine     REACTION: trouble breathing  . Ramipril     REACTION: cough  . Solu-Medrol [Methylprednisolone Acetate] Other (See Comments)    nausea and hives    Past Medical History:  Diagnosis Date  . Adrenal adenoma    1 cm Left  . CAD (coronary artery disease)    Dr Prince Rome, [facility] cardiology  . COPD (chronic obstructive pulmonary disease) (HCC)    lung nodule- Dr Tilden Dome  . Diverticulosis of colon   . Hip pain, left   . Hyperlipidemia   . Hypertension     Past Surgical History:  Procedure Laterality Date  . Lynx mid urethral sling     Dr Beverley Fiedler    Social History   Socioeconomic History  . Marital status: Married    Spouse name: Not on file  . Number of children: Not on file  . Years of education: Not on file  . Highest education level: Not on file  Occupational History  . Not on file  Social Needs  . Financial resource strain: Not on file  . Food insecurity:    Worry: Not on file    Inability: Not on file  . Transportation needs:    Medical: Not on  file    Non-medical: Not on file  Tobacco Use  . Smoking status: Former Smoker    Packs/day: 0.40    Last attempt to quit: 03/28/2016    Years since quitting: 2.3  . Smokeless tobacco: Never Used  Substance and Sexual Activity  . Alcohol use: No  . Drug use: Not on file  . Sexual activity: Yes  Lifestyle  . Physical activity:    Days per week: Not on file    Minutes per session: Not on file  . Stress: Not on file  Relationships  . Social connections:    Talks on phone: Not on file    Gets together: Not on file    Attends religious service: Not on file    Active member of club or organization: Not on file    Attends meetings of clubs or organizations: Not on file    Relationship status: Not on file  . Intimate partner violence:    Fear of current or ex partner: Not on file    Emotionally abused: Not on file    Physically abused: Not  on file    Forced sexual activity: Not on file  Other Topics Concern  . Not on file  Social History Narrative  . Not on file    Family History  Problem Relation Age of Onset  . Hyperlipidemia Mother   . Hypertension Mother   . Cancer Father        Oat cell lung CA  . Alcohol abuse Father     Outpatient Encounter Medications as of 08/07/2018  Medication Sig  . albuterol (PROAIR HFA) 108 (90 Base) MCG/ACT inhaler Inhale 2-4 puffs into the lungs every 6 (six) hours as needed.  Marland Kitchen albuterol (PROVENTIL) (2.5 MG/3ML) 0.083% nebulizer solution Take 3 mLs (2.5 mg total) by nebulization every 6 (six) hours as needed for wheezing or shortness of breath.  Marland Kitchen amLODipine (NORVASC) 5 MG tablet Take 1 tablet (5 mg total) by mouth daily.  Marland Kitchen aspirin 81 MG tablet Take 81 mg by mouth daily.    Marland Kitchen buPROPion (WELLBUTRIN XL) 150 MG 24 hr tablet   . cyanocobalamin (,VITAMIN B-12,) 1000 MCG/ML injection INJECT 1 ML SUBQUTANOUSLY EVERY 7 DAYS  . esomeprazole (NEXIUM) 40 MG capsule TAKE 1 CAPSULE (40 MG TOTAL) BY MOUTH DAILY.  Marland Kitchen gabapentin (NEURONTIN) 300 MG capsule  Take 300 mg by mouth at bedtime. Take 3 capsules (900 mg) by mouth at bedtime  . ipratropium (ATROVENT) 0.02 % nebulizer solution TAKE 2.5 MLS (0.5 MG TOTAL) BY NEBULIZATION 4 (FOUR) TIMES DAILY.  Marland Kitchen LORazepam (ATIVAN) 0.5 MG tablet Take 1-2 tablets (0.5-1 mg total) by mouth once as needed for up to 1 dose for anxiety or sedation.  Marland Kitchen losartan (COZAAR) 25 MG tablet Take 1 tablet (25 mg total) by mouth daily.  . meloxicam (MOBIC) 15 MG tablet Take 15 mg by mouth every morning.  . metoprolol tartrate (LOPRESSOR) 100 MG tablet Take 1 tablet (100 mg total) by mouth 2 (two) times daily.  Marland Kitchen rOPINIRole (REQUIP) 0.5 MG tablet   . sertraline (ZOLOFT) 100 MG tablet Take 1.5 tablets (150 mg total) by mouth daily.  . sucralfate (CARAFATE) 1 g tablet Take 1 tablet (1 g total) by mouth 4 (four) times daily -  with meals and at bedtime.  Marland Kitchen umeclidinium-vilanterol (ANORO ELLIPTA) 62.5-25 MCG/INH AEPB Inhale 1 puff into the lungs daily.  . valACYclovir (VALTREX) 1000 MG tablet Take 1 g by mouth 3 (three) times daily.  . varenicline (CHANTIX PAK) 0.5 MG X 11 & 1 MG X 42 tablet Take one 0.5 mg tablet by mouth once daily for 3 days, then increase to one 0.5 mg tablet twice daily for 4 days, then increase to one 1 mg tablet twice daily.  . [DISCONTINUED] amLODipine (NORVASC) 5 MG tablet TAKE 1 TABLET EVERY DAY  . [DISCONTINUED] losartan (COZAAR) 25 MG tablet TAKE 1 TABLET (25 MG TOTAL) BY MOUTH DAILY.  . [DISCONTINUED] methocarbamol (ROBAXIN) 750 MG tablet Take 750 mg by mouth 2 (two) times daily.  . [DISCONTINUED] metoprolol (LOPRESSOR) 100 MG tablet Take 1 tablet (100 mg total) by mouth 2 (two) times daily.  . [DISCONTINUED] sertraline (ZOLOFT) 100 MG tablet TAKE 1 TABLET EVERY DAY   No facility-administered encounter medications on file as of 08/07/2018.          Objective:   Physical Exam Constitutional:      Appearance: She is well-developed.  HENT:     Head: Normocephalic and atraumatic.   Cardiovascular:     Rate and Rhythm: Normal rate and regular rhythm.  Heart sounds: Normal heart sounds.  Pulmonary:     Effort: Pulmonary effort is normal.     Breath sounds: Normal breath sounds.  Skin:    General: Skin is warm and dry.  Neurological:     Mental Status: She is alert and oriented to person, place, and time.  Psychiatric:        Behavior: Behavior normal.         Assessment & Plan:  COPD -he is almost back to baseline but not quite still little bit of sputum but does feel like it is moving out of her chest.  Lung exam is clear today.  Continue with Anoro.  Work on smoking cessation.  Nausea -she actually [facility] which just showed a little bit of inflammation.  We will increase her Nexium to twice a day.  Okay to use the Carafate if needed.  I am also can have her drop her Chantix down to 1 tab twice a day instead of 2 to see if this helps as well.  She will call Monday and let us know if she is feeling any better.  Acute stress/Anxiety-we discussed options.  She would really like to try to going up on her sertraline so we will increase to 150 mg daily and see if this is helpful.  She Artie has a follow-up in about 8 weeks and I will see her then she can always call sooner if she is not improving

## 2018-08-07 NOTE — Patient Instructions (Addendum)
Try increasing your Nexium to twice a day Try decreasing your Chantix to 1 tab twice a day instead of 2.   Call on MOnday if you are not feeling better  Increase your sertraline to 1.5 tabs daily.

## 2018-08-09 ENCOUNTER — Other Ambulatory Visit: Payer: Self-pay | Admitting: *Deleted

## 2018-08-09 MED ORDER — CYANOCOBALAMIN 1000 MCG/ML IJ SOLN: INTRAMUSCULAR | 1 refills | Status: AC

## 2018-08-13 DIAGNOSIS — J189 Pneumonia, unspecified organism: Secondary | ICD-10-CM | POA: Diagnosis not present

## 2018-08-16 DIAGNOSIS — H25813 Combined forms of age-related cataract, bilateral: Secondary | ICD-10-CM | POA: Diagnosis not present

## 2018-08-16 DIAGNOSIS — H52223 Regular astigmatism, bilateral: Secondary | ICD-10-CM | POA: Diagnosis not present

## 2018-08-21 DIAGNOSIS — H02831 Dermatochalasis of right upper eyelid: Secondary | ICD-10-CM | POA: Diagnosis not present

## 2018-08-21 DIAGNOSIS — F419 Anxiety disorder, unspecified: Secondary | ICD-10-CM | POA: Diagnosis not present

## 2018-08-21 DIAGNOSIS — Z961 Presence of intraocular lens: Secondary | ICD-10-CM | POA: Diagnosis not present

## 2018-08-21 DIAGNOSIS — I10 Essential (primary) hypertension: Secondary | ICD-10-CM | POA: Diagnosis not present

## 2018-08-21 DIAGNOSIS — H527 Unspecified disorder of refraction: Secondary | ICD-10-CM | POA: Diagnosis not present

## 2018-08-21 DIAGNOSIS — H25812 Combined forms of age-related cataract, left eye: Secondary | ICD-10-CM | POA: Diagnosis not present

## 2018-08-21 DIAGNOSIS — J449 Chronic obstructive pulmonary disease, unspecified: Secondary | ICD-10-CM | POA: Diagnosis not present

## 2018-08-21 DIAGNOSIS — H25811 Combined forms of age-related cataract, right eye: Secondary | ICD-10-CM | POA: Diagnosis not present

## 2018-08-21 DIAGNOSIS — H25813 Combined forms of age-related cataract, bilateral: Secondary | ICD-10-CM | POA: Diagnosis not present

## 2018-08-21 DIAGNOSIS — H43813 Vitreous degeneration, bilateral: Secondary | ICD-10-CM | POA: Diagnosis not present

## 2018-08-21 DIAGNOSIS — H02834 Dermatochalasis of left upper eyelid: Secondary | ICD-10-CM | POA: Diagnosis not present

## 2018-08-21 DIAGNOSIS — H52223 Regular astigmatism, bilateral: Secondary | ICD-10-CM | POA: Diagnosis not present

## 2018-08-23 ENCOUNTER — Ambulatory Visit (INDEPENDENT_AMBULATORY_CARE_PROVIDER_SITE_OTHER): Payer: Medicare HMO | Admitting: Family Medicine

## 2018-08-23 ENCOUNTER — Encounter: Payer: Self-pay | Admitting: Family Medicine

## 2018-08-23 VITALS — BP 141/83 | HR 71 | Temp 98.4°F | Ht 65.0 in | Wt 182.0 lb

## 2018-08-23 DIAGNOSIS — Z23 Encounter for immunization: Secondary | ICD-10-CM

## 2018-08-23 DIAGNOSIS — R059 Cough, unspecified: Secondary | ICD-10-CM

## 2018-08-23 DIAGNOSIS — J441 Chronic obstructive pulmonary disease with (acute) exacerbation: Secondary | ICD-10-CM

## 2018-08-23 DIAGNOSIS — J329 Chronic sinusitis, unspecified: Secondary | ICD-10-CM | POA: Diagnosis not present

## 2018-08-23 DIAGNOSIS — J40 Bronchitis, not specified as acute or chronic: Secondary | ICD-10-CM

## 2018-08-23 DIAGNOSIS — R05 Cough: Secondary | ICD-10-CM

## 2018-08-23 MED ORDER — BENZONATATE 200 MG PO CAPS: 200.0000 mg | ORAL_CAPSULE | Freq: Two times a day (BID) | ORAL | 0 refills | Status: DC | PRN

## 2018-08-23 MED ORDER — HYDROCODONE-HOMATROPINE 5-1.5 MG/5ML PO SYRP: 5.0000 mL | ORAL_SOLUTION | Freq: Every evening | ORAL | 0 refills | Status: DC | PRN

## 2018-08-23 MED ORDER — DOXYCYCLINE HYCLATE 100 MG PO TABS: 100.0000 mg | ORAL_TABLET | Freq: Two times a day (BID) | ORAL | 0 refills | Status: DC

## 2018-08-23 MED ORDER — FLUTICASONE-UMECLIDIN-VILANT 100-62.5-25 MCG/INH IN AEPB: 1.0000 | INHALATION_SPRAY | Freq: Every day | RESPIRATORY_TRACT | 99 refills | Status: DC

## 2018-08-23 MED ORDER — IPRATROPIUM-ALBUTEROL 0.5-2.5 (3) MG/3ML IN SOLN
3.0000 mL | Freq: Once | RESPIRATORY_TRACT | Status: AC
  Administered 2018-08-23: 3 mL via RESPIRATORY_TRACT

## 2018-08-23 MED ORDER — PREDNISONE 20 MG PO TABS: 40.0000 mg | ORAL_TABLET | Freq: Every day | ORAL | 0 refills | Status: DC

## 2018-08-23 NOTE — Progress Notes (Signed)
Established Patient Office Visit  Subjective:  Patient ID: Nancy Cisneros, female    DOB: 1941-04-28  Age: 78 y.o. MRN: [id number]  CC:  Chief Complaint  Patient presents with  . Cough    pt reports that she has been feeling like this x1.5 wks productive cough using mucinex,albuterol inhaler and neb tx. her last neb treatment was last night. she stated that she did have a low grade fever of 100 a few days ago    HPI Nancy Cisneros with COPD presents for persistant cough.  She feels like she is been worse over the last 1-1/2 weeks.  She is been using her inhaler and nebulizer treatments.  Last neb treatment was last night.  She feels like she was running a low-grade fever right around 100 a few days ago.  She is been also taking Mucinex and Tessalon Perles.  Last COPD exacerbation was about 6 weeks ago. No ST sputum is milk white.  Has been having bad headaches and sinus congesteion. + SOB.   Past Medical History:  Diagnosis Date  . Adrenal adenoma    1 cm Left  . CAD (coronary artery disease)    Dr Prince Rome, [facility] cardiology  . COPD (chronic obstructive pulmonary disease) (HCC)    lung nodule- Dr Tilden Dome  . Diverticulosis of colon   . Hip pain, left   . Hyperlipidemia   . Hypertension     Past Surgical History:  Procedure Laterality Date  . Lynx mid urethral sling     Dr Beverley Fiedler    Family History  Problem Relation Age of Onset  . Hyperlipidemia Mother   . Hypertension Mother   . Cancer Father        Oat cell lung CA  . Alcohol abuse Father     Social History   Socioeconomic History  . Marital status: Married    Spouse name: Not on file  . Number of children: Not on file  . Years of education: Not on file  . Highest education level: Not on file  Occupational History  . Not on file  Social Needs  . Financial resource strain: Not on file  . Food insecurity:    Worry: Not on file    Inability: Not on file  . Transportation needs:    Medical: Not on file   Non-medical: Not on file  Tobacco Use  . Smoking status: Former Smoker    Packs/day: 0.40    Last attempt to quit: 03/28/2016    Years since quitting: 2.4  . Smokeless tobacco: Never Used  Substance and Sexual Activity  . Alcohol use: No  . Drug use: Not on file  . Sexual activity: Yes  Lifestyle  . Physical activity:    Days per week: Not on file    Minutes per session: Not on file  . Stress: Not on file  Relationships  . Social connections:    Talks on phone: Not on file    Gets together: Not on file    Attends religious service: Not on file    Active member of club or organization: Not on file    Attends meetings of clubs or organizations: Not on file    Relationship status: Not on file  . Intimate partner violence:    Fear of current or ex partner: Not on file    Emotionally abused: Not on file    Physically abused: Not on file    Forced sexual activity:  Not on file  Other Topics Concern  . Not on file  Social History Narrative  . Not on file    Outpatient Medications Prior to Visit  Medication Sig Dispense Refill  . albuterol (PROAIR HFA) 108 (90 Base) MCG/ACT inhaler Inhale 2-4 puffs into the lungs every 6 (six) hours as needed. 1 Inhaler 3  . albuterol (PROVENTIL) (2.5 MG/3ML) 0.083% nebulizer solution Take 3 mLs (2.5 mg total) by nebulization every 6 (six) hours as needed for wheezing or shortness of breath. 450 mL 1  . amLODipine (NORVASC) 5 MG tablet Take 1 tablet (5 mg total) by mouth daily. 90 tablet 1  . aspirin 81 MG tablet Take 81 mg by mouth daily.      Marland Kitchen buPROPion (WELLBUTRIN XL) 150 MG 24 hr tablet     . cyanocobalamin (,VITAMIN B-12,) 1000 MCG/ML injection INJECT 1 ML SUBQUTANOUSLY EVERY 7 DAYS 30 mL 1  . esomeprazole (NEXIUM) 40 MG capsule TAKE 1 CAPSULE (40 MG TOTAL) BY MOUTH DAILY. 90 capsule 3  . gabapentin (NEURONTIN) 300 MG capsule Take 300 mg by mouth at bedtime. Take 3 capsules (900 mg) by mouth at bedtime    . ipratropium (ATROVENT) 0.02 %  nebulizer solution TAKE 2.5 MLS (0.5 MG TOTAL) BY NEBULIZATION 4 (FOUR) TIMES DAILY. 312.5 mL 0  . LORazepam (ATIVAN) 0.5 MG tablet Take 1-2 tablets (0.5-1 mg total) by mouth once as needed for up to 1 dose for anxiety or sedation. 2 tablet 0  . losartan (COZAAR) 25 MG tablet Take 1 tablet (25 mg total) by mouth daily. 90 tablet 1  . meloxicam (MOBIC) 15 MG tablet Take 15 mg by mouth every morning.  3  . metoprolol tartrate (LOPRESSOR) 100 MG tablet Take 1 tablet (100 mg total) by mouth 2 (two) times daily. 180 tablet 1  . rOPINIRole (REQUIP) 0.5 MG tablet     . sertraline (ZOLOFT) 100 MG tablet Take 1.5 tablets (150 mg total) by mouth daily. 135 tablet 1  . sucralfate (CARAFATE) 1 g tablet Take 1 tablet (1 g total) by mouth 4 (four) times daily -  with meals and at bedtime. 120 tablet 1  . umeclidinium-vilanterol (ANORO ELLIPTA) 62.5-25 MCG/INH AEPB Inhale 1 puff into the lungs daily. 1 each 0  . valACYclovir (VALTREX) 1000 MG tablet Take 1 g by mouth 3 (three) times daily.  0  . varenicline (CHANTIX PAK) 0.5 MG X 11 & 1 MG X 42 tablet Take one 0.5 mg tablet by mouth once daily for 3 days, then increase to one 0.5 mg tablet twice daily for 4 days, then increase to one 1 mg tablet twice daily. 53 tablet 0   No facility-administered medications prior to visit.     Allergies  Allergen Reactions  . Codeine     REACTION: trouble breathing  . Ramipril     REACTION: cough  . Solu-Medrol [Methylprednisolone Acetate] Other (See Comments)    nausea and hives    ROS Review of Systems    Objective:    Physical Exam  Constitutional: She is oriented to person, place, and time. She appears well-developed and well-nourished.  HENT:  Head: Normocephalic and atraumatic.  Right Ear: External ear normal.  Left Ear: External ear normal.  Nose: Nose normal.  Mouth/Throat: Oropharynx is clear and moist.  TMs and canals are clear.   Eyes: Pupils are equal, round, and reactive to light. Conjunctivae  and EOM are normal.  Neck: Neck supple. No thyromegaly present.  Cardiovascular:  Normal rate, regular rhythm and normal heart sounds.  Pulmonary/Chest: Effort normal. She has no wheezes.  Diffuse bilateral  loud rhonchi.  Lymphadenopathy:    She has no cervical adenopathy.  Neurological: She is alert and oriented to person, place, and time.  Skin: Skin is warm and dry.  Psychiatric: She has a normal mood and affect.    BP (!) 141/83   Pulse 71   Temp 98.4 F (36.9 C)   Ht 5\' 5"  (1.651 m)   Wt 182 lb (82.6 kg)   SpO2 100%   BMI 30.29 kg/m  Wt Readings from Last 3 Encounters:  08/23/18 182 lb (82.6 kg)  08/07/18 185 lb (83.9 kg)  07/05/18 186 lb (84.4 kg)     There are no preventive care reminders to display for this patient.  There are no preventive care reminders to display for this patient.  Lab Results  Component Value Date   TSH 0.79 06/05/2018   Lab Results  Component Value Date   WBC 7.6 06/05/2018   HGB 13.7 06/05/2018   HCT 39.2 06/05/2018   MCV 87.5 06/05/2018   PLT 218 06/05/2018   Lab Results  Component Value Date   NA 137 06/05/2018   K 4.6 06/05/2018   CO2 29 06/05/2018   GLUCOSE 87 06/05/2018   BUN 14 06/05/2018   CREATININE 1.00 (H) 06/05/2018   BILITOT 0.4 06/05/2018   ALKPHOS 67 08/29/2016   AST 14 06/05/2018   ALT 24 06/05/2018   PROT 6.1 06/05/2018   ALBUMIN 3.9 08/29/2016   CALCIUM 9.7 06/05/2018   Lab Results  Component Value Date   CHOL 215 (H) 06/05/2018   Lab Results  Component Value Date   HDL 44 (L) 06/05/2018   Lab Results  Component Value Date   LDLCALC 130 (H) 06/05/2018   Lab Results  Component Value Date   TRIG 266 (H) 06/05/2018   Lab Results  Component Value Date   CHOLHDL 4.9 06/05/2018   No results found for: HGBA1C    Assessment & Plan:   Problem List Items Addressed This Visit    None    Visit Diagnoses    Cough    -  Primary   Relevant Medications   ipratropium-albuterol (DUONEB) 0.5-2.5  (3) MG/3ML nebulizer solution 3 mL (Completed)   COPD exacerbation (HCC)       Relevant Medications   ipratropium-albuterol (DUONEB) 0.5-2.5 (3) MG/3ML nebulizer solution 3 mL (Completed)   predniSONE (DELTASONE) 20 MG tablet   Fluticasone-Umeclidin-Vilant (TRELEGY ELLIPTA) 100-62.5-25 MCG/INH AEPB   benzonatate (TESSALON) 200 MG capsule   Sinobronchitis       Relevant Medications   doxycycline (VIBRA-TABS) 100 MG tablet   predniSONE (DELTASONE) 20 MG tablet   benzonatate (TESSALON) 200 MG capsule   Need for immunization against influenza       Relevant Orders   Flu Vaccine QUAD 36+ mos IM (Completed)     Treat acute exacerbation with doxycycline and prednisone.  Obtain a try to increase her Anoro up to Trelegy and do a triple therapy to try to reduce inflammation in her lungs to try to prevent recurrence since this is now her second exacerbation in a short period of time.  That her husband is also 34 I suspect this was initially a viral illness.  Meds ordered this encounter  Medications  . ipratropium-albuterol (DUONEB) 0.5-2.5 (3) MG/3ML nebulizer solution 3 mL  . doxycycline (VIBRA-TABS) 100 MG tablet    Sig: Take 1 tablet (  100 mg total) by mouth 2 (two) times daily.    Dispense:  20 tablet    Refill:  0  . predniSONE (DELTASONE) 20 MG tablet    Sig: Take 2 tablets (40 mg total) by mouth daily with breakfast.    Dispense:  10 tablet    Refill:  0  . Fluticasone-Umeclidin-Vilant (TRELEGY ELLIPTA) 100-62.5-25 MCG/INH AEPB    Sig: Inhale 1 Inhaler into the lungs daily.    Dispense:  1 each    Refill:  PRN  . DISCONTD: HYDROcodone-homatropine (HYCODAN) 5-1.5 MG/5ML syrup    Sig: Take 5 mLs by mouth at bedtime as needed for cough.    Dispense:  120 mL    Refill:  0  . DISCONTD: HYDROcodone-homatropine (HYCODAN) 5-1.5 MG/5ML syrup    Sig: Take 5 mLs by mouth at bedtime as needed for cough.    Dispense:  120 mL    Refill:  0  . benzonatate (TESSALON) 200 MG capsule    Sig: Take  1 capsule (200 mg total) by mouth 2 (two) times daily as needed for cough.    Dispense:  20 capsule    Refill:  0    Follow-up: Return in about 2 weeks (around 09/06/2018) for recheck lungs  .    Beatrice Lecher, MD

## 2018-08-24 ENCOUNTER — Telehealth: Payer: Self-pay | Admitting: Family Medicine

## 2018-08-24 NOTE — Telephone Encounter (Signed)
Contacted [facility], they will update gender on home sleep study from female to female. Signed orders faxed back. Copy sent to scan.

## 2018-09-06 ENCOUNTER — Ambulatory Visit: Payer: Medicare HMO | Admitting: Family Medicine

## 2018-09-06 DIAGNOSIS — G5603 Carpal tunnel syndrome, bilateral upper limbs: Secondary | ICD-10-CM | POA: Diagnosis not present

## 2018-09-06 DIAGNOSIS — G603 Idiopathic progressive neuropathy: Secondary | ICD-10-CM | POA: Diagnosis not present

## 2018-09-06 DIAGNOSIS — G500 Trigeminal neuralgia: Secondary | ICD-10-CM | POA: Diagnosis not present

## 2018-09-06 DIAGNOSIS — G5621 Lesion of ulnar nerve, right upper limb: Secondary | ICD-10-CM | POA: Diagnosis not present

## 2018-09-06 DIAGNOSIS — M5417 Radiculopathy, lumbosacral region: Secondary | ICD-10-CM | POA: Diagnosis not present

## 2018-09-06 DIAGNOSIS — G2581 Restless legs syndrome: Secondary | ICD-10-CM | POA: Diagnosis not present

## 2018-09-06 DIAGNOSIS — R270 Ataxia, unspecified: Secondary | ICD-10-CM | POA: Diagnosis not present

## 2018-09-06 DIAGNOSIS — M5412 Radiculopathy, cervical region: Secondary | ICD-10-CM | POA: Diagnosis not present

## 2018-09-13 DIAGNOSIS — J189 Pneumonia, unspecified organism: Secondary | ICD-10-CM | POA: Diagnosis not present

## 2018-09-18 DIAGNOSIS — Z961 Presence of intraocular lens: Secondary | ICD-10-CM | POA: Diagnosis not present

## 2018-09-18 DIAGNOSIS — J449 Chronic obstructive pulmonary disease, unspecified: Secondary | ICD-10-CM | POA: Diagnosis not present

## 2018-09-18 DIAGNOSIS — F419 Anxiety disorder, unspecified: Secondary | ICD-10-CM | POA: Diagnosis not present

## 2018-09-18 DIAGNOSIS — H43813 Vitreous degeneration, bilateral: Secondary | ICD-10-CM | POA: Diagnosis not present

## 2018-09-18 DIAGNOSIS — H25811 Combined forms of age-related cataract, right eye: Secondary | ICD-10-CM | POA: Diagnosis not present

## 2018-09-18 DIAGNOSIS — H25812 Combined forms of age-related cataract, left eye: Secondary | ICD-10-CM | POA: Diagnosis not present

## 2018-09-18 DIAGNOSIS — I2510 Atherosclerotic heart disease of native coronary artery without angina pectoris: Secondary | ICD-10-CM | POA: Diagnosis not present

## 2018-09-18 DIAGNOSIS — H527 Unspecified disorder of refraction: Secondary | ICD-10-CM | POA: Diagnosis not present

## 2018-09-18 DIAGNOSIS — H52223 Regular astigmatism, bilateral: Secondary | ICD-10-CM | POA: Diagnosis not present

## 2018-09-18 DIAGNOSIS — Z9841 Cataract extraction status, right eye: Secondary | ICD-10-CM | POA: Diagnosis not present

## 2018-09-19 DIAGNOSIS — Z961 Presence of intraocular lens: Secondary | ICD-10-CM | POA: Diagnosis not present

## 2018-09-19 DIAGNOSIS — H52221 Regular astigmatism, right eye: Secondary | ICD-10-CM | POA: Diagnosis not present

## 2018-09-19 DIAGNOSIS — H02834 Dermatochalasis of left upper eyelid: Secondary | ICD-10-CM | POA: Diagnosis not present

## 2018-09-19 DIAGNOSIS — H527 Unspecified disorder of refraction: Secondary | ICD-10-CM | POA: Diagnosis not present

## 2018-09-19 DIAGNOSIS — H02831 Dermatochalasis of right upper eyelid: Secondary | ICD-10-CM | POA: Diagnosis not present

## 2018-09-19 DIAGNOSIS — H43813 Vitreous degeneration, bilateral: Secondary | ICD-10-CM | POA: Diagnosis not present

## 2018-09-24 ENCOUNTER — Encounter: Payer: Self-pay | Admitting: Family Medicine

## 2018-09-24 ENCOUNTER — Ambulatory Visit (INDEPENDENT_AMBULATORY_CARE_PROVIDER_SITE_OTHER): Payer: Medicare HMO | Admitting: Family Medicine

## 2018-09-24 VITALS — BP 123/57 | HR 62 | Ht 65.0 in | Wt 179.0 lb

## 2018-09-24 DIAGNOSIS — J410 Simple chronic bronchitis: Secondary | ICD-10-CM | POA: Diagnosis not present

## 2018-09-24 DIAGNOSIS — J0100 Acute maxillary sinusitis, unspecified: Secondary | ICD-10-CM

## 2018-09-24 DIAGNOSIS — I10 Essential (primary) hypertension: Secondary | ICD-10-CM

## 2018-09-24 MED ORDER — AMOXICILLIN-POT CLAVULANATE 875-125 MG PO TABS: 1.0000 | ORAL_TABLET | Freq: Two times a day (BID) | ORAL | 0 refills | Status: DC

## 2018-09-24 NOTE — Progress Notes (Signed)
Subjective:    CC:   HPI:  Hypertension- Pt denies chest pain, SOB, dizziness, or heart palpitations.  Taking meds as directed w/o problems.  Denies medication side effects.    She has left cataract surgery I last saw her.  She originally had to delay it because she was sick but was able to get it done at the end of January..   She was seen for COPD exacerbation on January 2 about 4 weeks ago I treated her with doxycycline and prednisone.  We also increased her inhaler up to Trelegy for triple therapy because of 2 exacerbations in a very short period of time.  She felt better initially for about a week or so and then started to feel worse again she still had some persistent sinus congestion is now blowing out a lot of yellow phlegm.  No fevers or chills.  No bad taste in her mouth.  She still a little short of breath but says it better than it was when she came in the first time.  Complains of bilateral maxillary pressure worse on the left.  Past medical history, Surgical history, Family history not pertinant except as noted below, Social history, Allergies, and medications have been entered into the medical record, reviewed, and corrections made.   Review of Systems: No fevers, chills, night sweats, weight loss, chest pain, or shortness of breath.   Objective:    General: Well Developed, well nourished, and in no acute distress.  Neuro: Alert and oriented x3, extra-ocular muscles intact, sensation grossly intact.  HEENT: Normocephalic, atraumatic oropharynx is clear, TMs and canals are clear bilaterally.  No significant cervical lymphadenopathy. Skin: Warm and dry, no rashes. Cardiac: Regular rate and rhythm, no murmurs rubs or gallops, no lower extremity edema.  Respiratory: Clear to auscultation bilaterally. Not using accessory muscles, speaking in full sentences.  Fuhs rhonchi.  No crackles.   Impression and Recommendations:    HTN -well controlled today.  Acute sinusitis-unclear if  this is a second illness or just residual from the initial illness where she came in with sinus and bronchitis type symptoms.  Her chest is better than it was but she actually still has some diffuse wheezing on exam today.  Treat with Augmentin.  COPD-no increased work of breathing or increase shortness of breath currently.  Right now I think most of her symptoms are coming from her sinuses.  So we will hold off on any prednisone but if at any point she feels she is getting worse then she can give [place] a call back.

## 2018-10-05 IMAGING — DX DG KNEE COMPLETE 4+V*R*
4 series · 4 of 4 positions shown · non-contrast
Comparison: None.

CLINICAL DATA: PT fell and twisted RT knee about 8 days ago. now
painful and swollen.

EXAM:
RIGHT KNEE - COMPLETE 4+ VIEW

[knee ap]
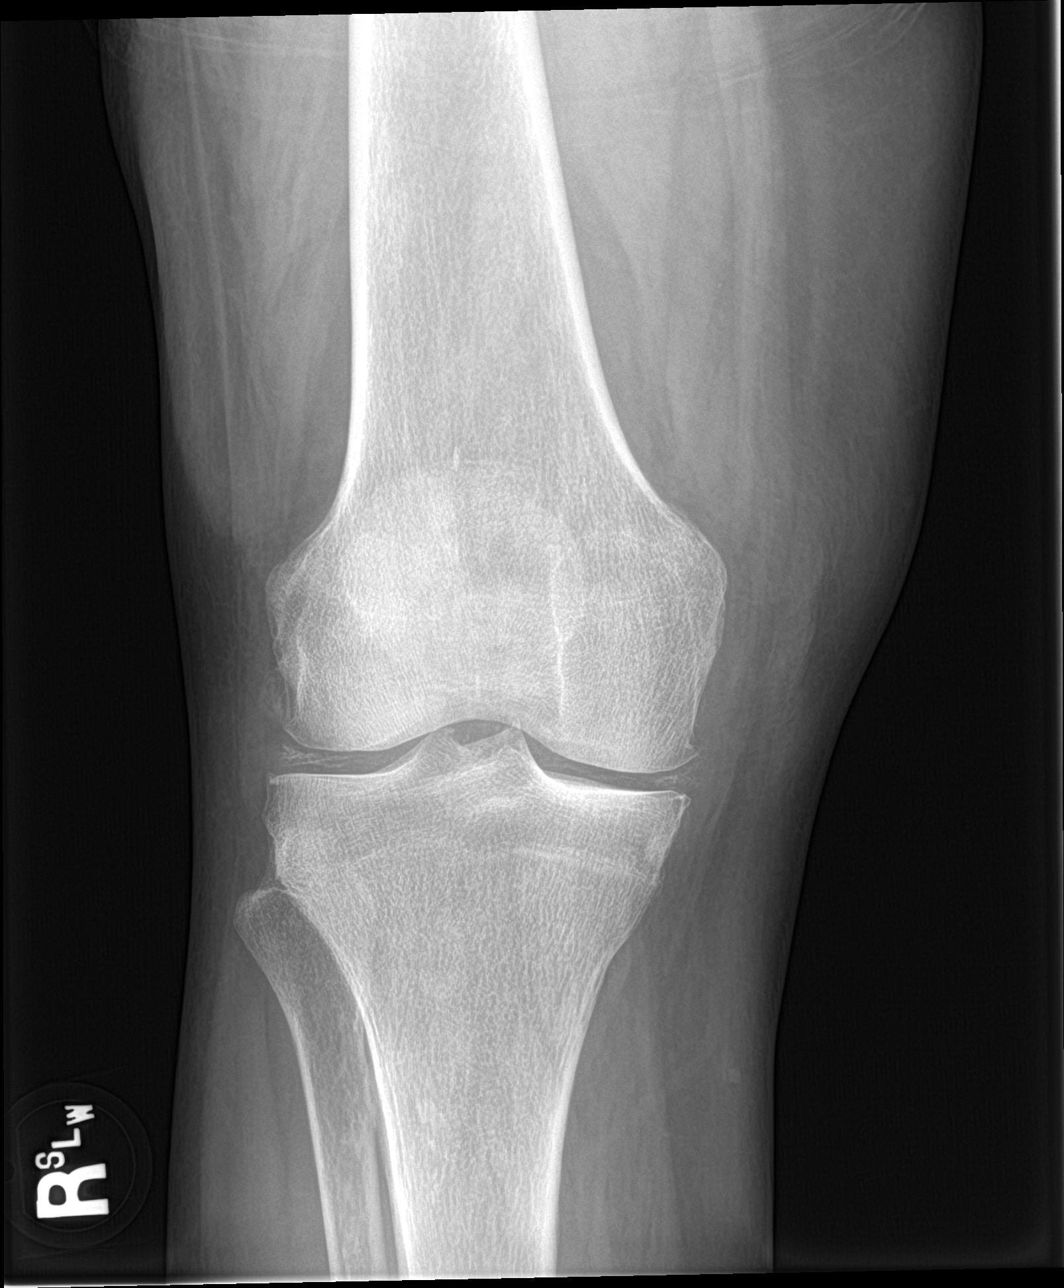

[knee lat]
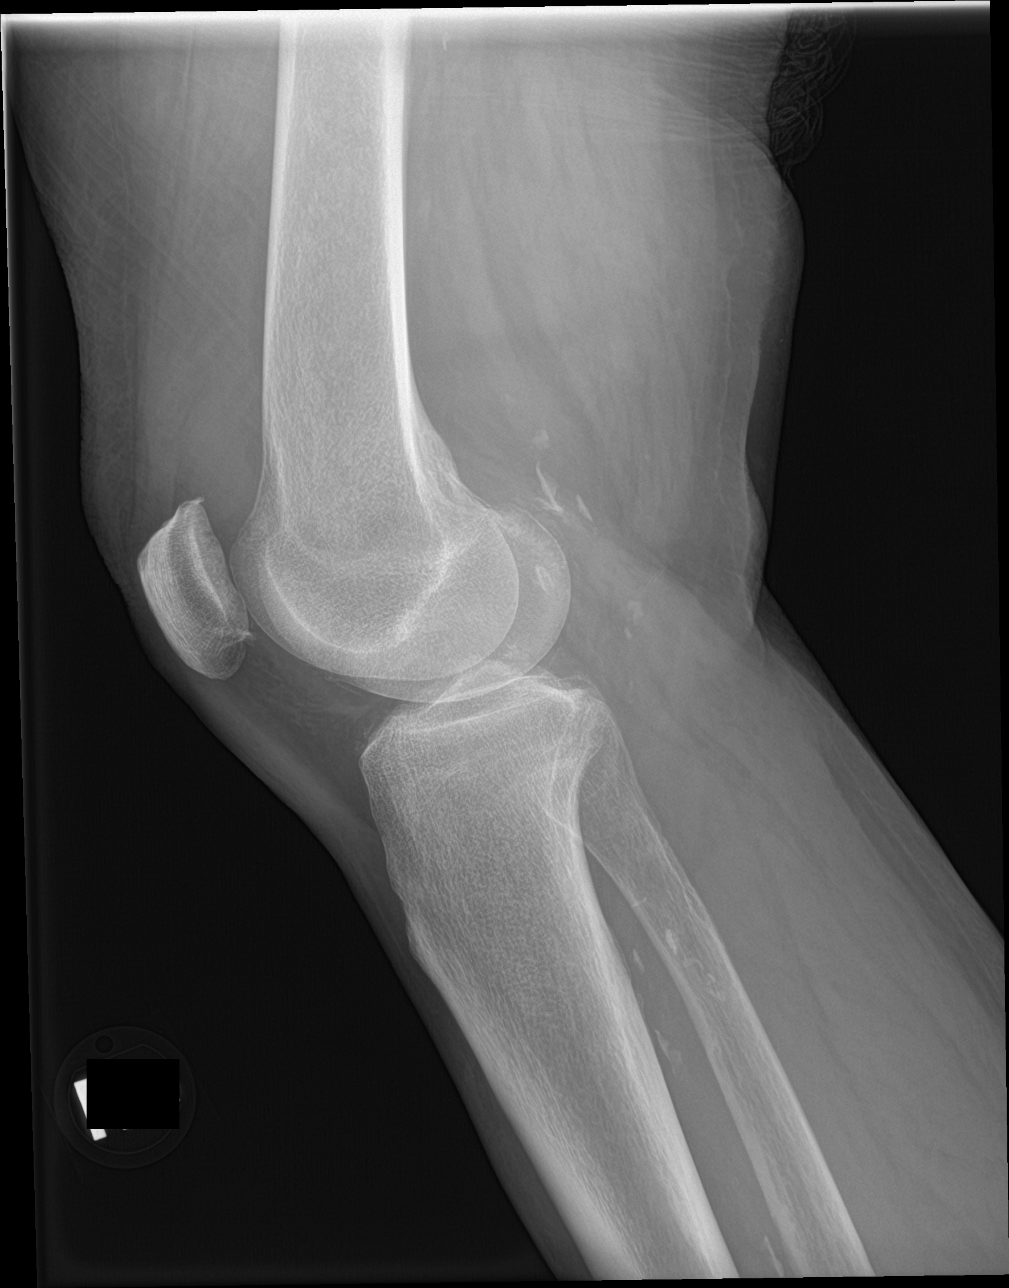

[knee obl (1 of 2)]
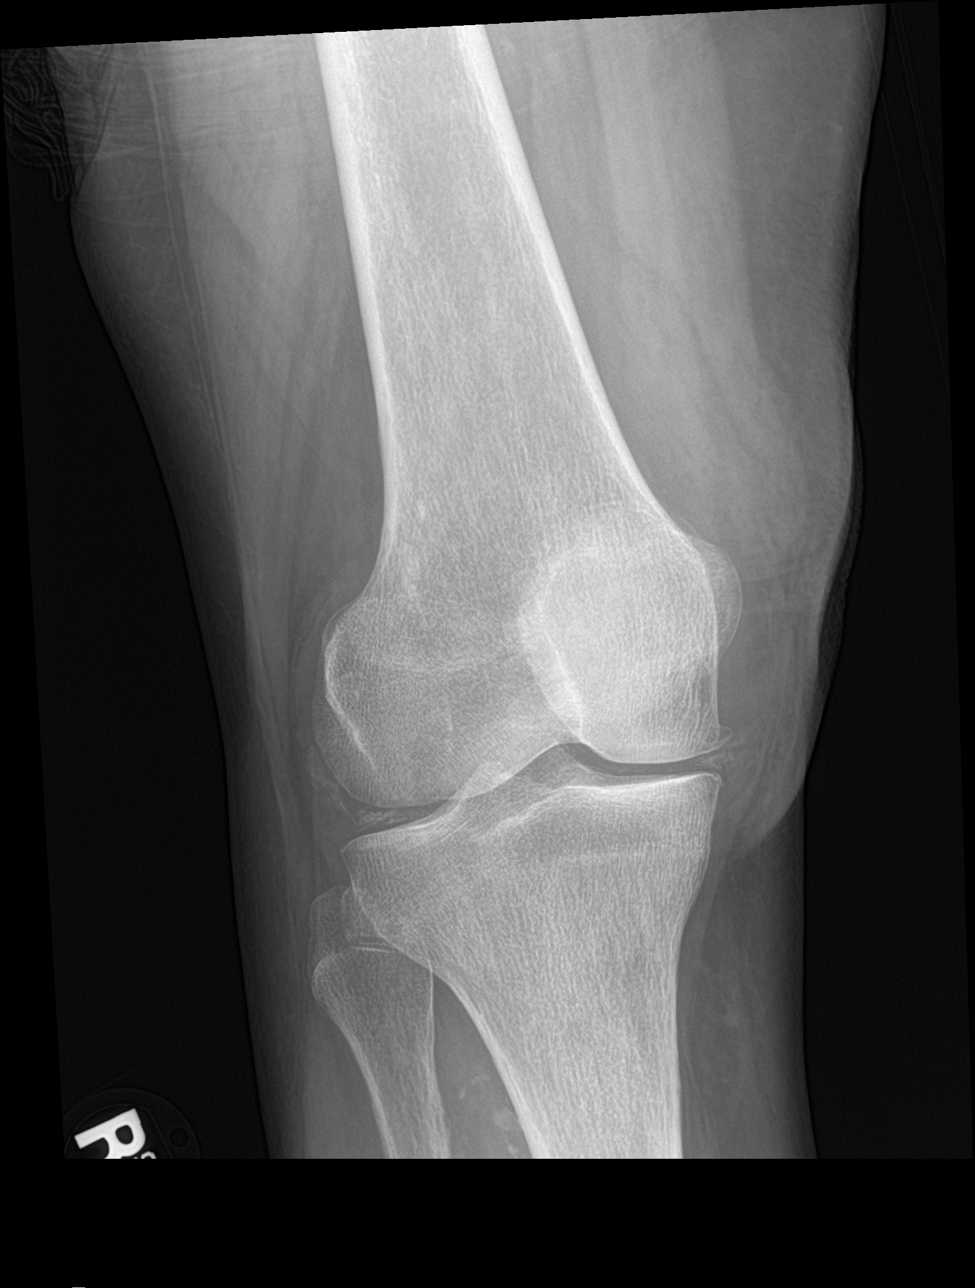

[knee obl (2 of 2)]
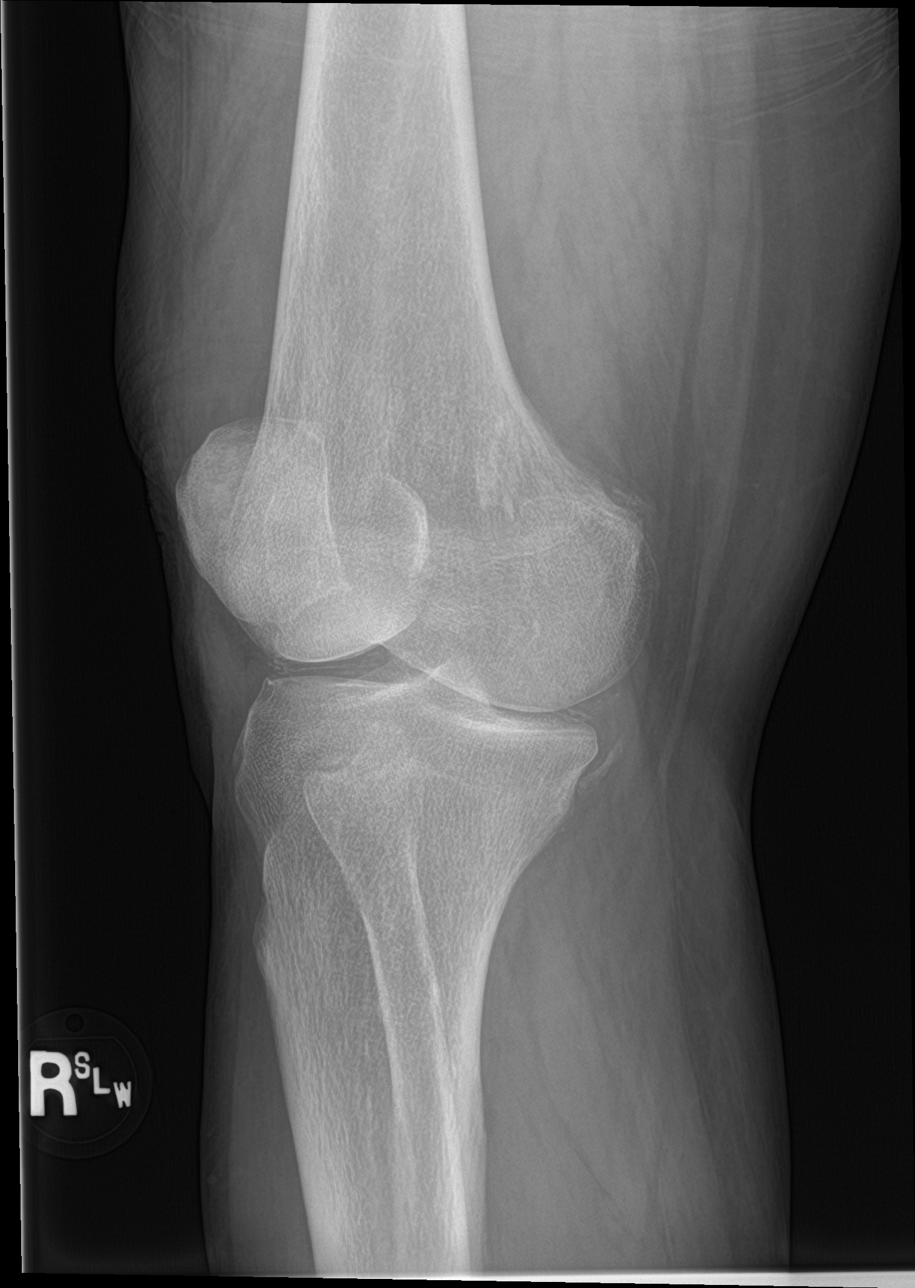

[4 of 4 positions shown; findings below may reference images not displayed]

FINDINGS: No fracture.  No bone lesion.

Mild medial joint space compartment narrowing. Small marginal
osteophytes from all 3 joint space compartments. Chondrocalcinosis
extends along both menisci. No other arthropathic change.

Small joint effusion evident in the suprapatellar aspect of the
joint.

There are scattered posterior arterial vascular calcifications. Soft
tissues are otherwise unremarkable.
IMPRESSION: 1. No fracture or bone lesion.
2. Mild osteoarthritis.
3. Small joint effusion.

## 2018-10-14 DIAGNOSIS — J189 Pneumonia, unspecified organism: Secondary | ICD-10-CM | POA: Diagnosis not present

## 2018-10-16 ENCOUNTER — Ambulatory Visit (INDEPENDENT_AMBULATORY_CARE_PROVIDER_SITE_OTHER): Payer: Medicare HMO | Admitting: Family Medicine

## 2018-10-16 ENCOUNTER — Telehealth: Payer: Self-pay

## 2018-10-16 ENCOUNTER — Encounter: Payer: Self-pay | Admitting: Family Medicine

## 2018-10-16 VITALS — BP 115/62 | HR 81 | Temp 98.7°F | Ht 65.0 in | Wt 179.0 lb

## 2018-10-16 DIAGNOSIS — R509 Fever, unspecified: Secondary | ICD-10-CM

## 2018-10-16 DIAGNOSIS — J441 Chronic obstructive pulmonary disease with (acute) exacerbation: Secondary | ICD-10-CM | POA: Diagnosis not present

## 2018-10-16 LAB — POCT INFLUENZA A/B
Influenza A, POC: NEGATIVE
Influenza B, POC: NEGATIVE

## 2018-10-16 MED ORDER — DOXYCYCLINE HYCLATE 100 MG PO TABS: 100.0000 mg | ORAL_TABLET | Freq: Two times a day (BID) | ORAL | 0 refills | Status: DC

## 2018-10-16 MED ORDER — PREDNISONE 20 MG PO TABS: 40.0000 mg | ORAL_TABLET | Freq: Every day | ORAL | 0 refills | Status: DC

## 2018-10-16 NOTE — Telephone Encounter (Signed)
Since it has been 3 weeks since seen she needs appt.

## 2018-10-16 NOTE — Progress Notes (Signed)
Acute Office Visit  Subjective:    Patient ID: Nancy Cisneros, female    DOB: July 19, 1941, 78 y.o.   MRN: [id number]  Chief Complaint  Patient presents with  . Fever    HPI Patient is in today for cough.  I had actually seen her a little over 3 weeks ago treated her for sinusitis, bronchitis and COPD exacerbation.  She said she was better for about a week and a half until Sunday night when she started feeling achy all over.  Her cough came back abruptly.  She said she had been around 1 of her grandchildren about 2 days prior he was diagnosed with the flu.  She said she has had some intermittent intermittent diarrhea over the last couple days but no nausea.  No significant sore throat but has had a runny nose as well as some chest congestion.  Past Medical History:  Diagnosis Date  . Adrenal adenoma    1 cm Left  . CAD (coronary artery disease)    Dr Prince Rome, [facility] cardiology  . COPD (chronic obstructive pulmonary disease) (HCC)    lung nodule- Dr Tilden Dome  . Diverticulosis of colon   . Hip pain, left   . Hyperlipidemia   . Hypertension     Past Surgical History:  Procedure Laterality Date  . Lynx mid urethral sling     Dr Beverley Fiedler    Family History  Problem Relation Age of Onset  . Hyperlipidemia Mother   . Hypertension Mother   . Cancer Father        Oat cell lung CA  . Alcohol abuse Father     Social History   Socioeconomic History  . Marital status: Married    Spouse name: Not on file  . Number of children: Not on file  . Years of education: Not on file  . Highest education level: Not on file  Occupational History  . Not on file  Social Needs  . Financial resource strain: Not on file  . Food insecurity:    Worry: Not on file    Inability: Not on file  . Transportation needs:    Medical: Not on file    Non-medical: Not on file  Tobacco Use  . Smoking status: Former Smoker    Packs/day: 0.40    Last attempt to quit: 03/28/2016    Years since quitting:  2.5  . Smokeless tobacco: Never Used  Substance and Sexual Activity  . Alcohol use: No  . Drug use: Not on file  . Sexual activity: Yes  Lifestyle  . Physical activity:    Days per week: Not on file    Minutes per session: Not on file  . Stress: Not on file  Relationships  . Social connections:    Talks on phone: Not on file    Gets together: Not on file    Attends religious service: Not on file    Active member of club or organization: Not on file    Attends meetings of clubs or organizations: Not on file    Relationship status: Not on file  . Intimate partner violence:    Fear of current or ex partner: Not on file    Emotionally abused: Not on file    Physically abused: Not on file    Forced sexual activity: Not on file  Other Topics Concern  . Not on file  Social History Narrative  . Not on file    Outpatient Medications  Prior to Visit  Medication Sig Dispense Refill  . albuterol (PROAIR HFA) 108 (90 Base) MCG/ACT inhaler Inhale 2-4 puffs into the lungs every 6 (six) hours as needed. 1 Inhaler 3  . albuterol (PROVENTIL) (2.5 MG/3ML) 0.083% nebulizer solution Take 3 mLs (2.5 mg total) by nebulization every 6 (six) hours as needed for wheezing or shortness of breath. 450 mL 1  . amLODipine (NORVASC) 10 MG tablet Take 10 mg by mouth daily.    Marland Kitchen aspirin 81 MG tablet Take 81 mg by mouth daily.      Marland Kitchen buPROPion (WELLBUTRIN XL) 150 MG 24 hr tablet     . cyanocobalamin (,VITAMIN B-12,) 1000 MCG/ML injection INJECT 1 ML SUBQUTANOUSLY EVERY 7 DAYS 30 mL 1  . esomeprazole (NEXIUM) 40 MG capsule TAKE 1 CAPSULE (40 MG TOTAL) BY MOUTH DAILY. 90 capsule 3  . Fluticasone-Umeclidin-Vilant (TRELEGY ELLIPTA) 100-62.5-25 MCG/INH AEPB Inhale 1 Inhaler into the lungs daily. 1 each PRN  . gabapentin (NEURONTIN) 300 MG capsule Take 300 mg by mouth at bedtime. Take 3 capsules (900 mg) by mouth at bedtime    . HYDROcodone-acetaminophen (NORCO) 10-325 MG tablet     . ipratropium (ATROVENT) 0.02 %  nebulizer solution TAKE 2.5 MLS (0.5 MG TOTAL) BY NEBULIZATION 4 (FOUR) TIMES DAILY. 312.5 mL 0  . ketorolac (ACULAR) 0.5 % ophthalmic solution     . losartan (COZAAR) 25 MG tablet Take 1 tablet (25 mg total) by mouth daily. 90 tablet 1  . meloxicam (MOBIC) 15 MG tablet Take 15 mg by mouth every morning.  3  . methocarbamol (ROBAXIN) 750 MG tablet Take 750 mg by mouth daily.    . metoprolol tartrate (LOPRESSOR) 100 MG tablet Take 1 tablet (100 mg total) by mouth 2 (two) times daily. 180 tablet 1  . prednisoLONE acetate (PRED FORTE) 1 % ophthalmic suspension     . rOPINIRole (REQUIP) 0.5 MG tablet     . sertraline (ZOLOFT) 100 MG tablet Take 1.5 tablets (150 mg total) by mouth daily. 135 tablet 1  . sucralfate (CARAFATE) 1 g tablet Take 1 tablet (1 g total) by mouth 4 (four) times daily -  with meals and at bedtime. 120 tablet 1  . umeclidinium-vilanterol (ANORO ELLIPTA) 62.5-25 MCG/INH AEPB Inhale 1 puff into the lungs daily. 1 each 0  . valACYclovir (VALTREX) 1000 MG tablet Take 1 g by mouth 3 (three) times daily.  0  . varenicline (CHANTIX PAK) 0.5 MG X 11 & 1 MG X 42 tablet Take one 0.5 mg tablet by mouth once daily for 3 days, then increase to one 0.5 mg tablet twice daily for 4 days, then increase to one 1 mg tablet twice daily. 53 tablet 0  . amoxicillin-clavulanate (AUGMENTIN) 875-125 MG tablet Take 1 tablet by mouth 2 (two) times daily. 20 tablet 0   No facility-administered medications prior to visit.     Allergies  Allergen Reactions  . Codeine     REACTION: trouble breathing  . Ramipril     REACTION: cough  . Solu-Medrol [Methylprednisolone Acetate] Other (See Comments)    nausea and hives    ROS     Objective:    Physical Exam  Constitutional: She is oriented to person, place, and time. She appears well-developed and well-nourished.  HENT:  Head: Normocephalic and atraumatic.  Right Ear: External ear normal.  Left Ear: External ear normal.  Nose: Nose normal.   Mouth/Throat: Oropharynx is clear and moist.  TMs and canals are clear.  Eyes: Pupils are equal, round, and reactive to light. Conjunctivae and EOM are normal.  Neck: Neck supple. No thyromegaly present.  Cardiovascular: Normal rate, regular rhythm and normal heart sounds.  Pulmonary/Chest: Effort normal and breath sounds normal. No respiratory distress. She has no wheezes. She exhibits no tenderness.  Use rhonchi and vibration throughout her entire chest.  No crackles.  Lymphadenopathy:    She has no cervical adenopathy.  Neurological: She is alert and oriented to person, place, and time.  Skin: Skin is warm and dry.  Psychiatric: She has a normal mood and affect.    BP 115/62   Pulse 81   Temp 98.7 F (37.1 C)   Ht 5\' 5"  (1.651 m)   Wt 179 lb (81.2 kg)   SpO2 94%   BMI 29.79 kg/m  Wt Readings from Last 3 Encounters:  10/16/18 179 lb (81.2 kg)  09/24/18 179 lb (81.2 kg)  08/23/18 182 lb (82.6 kg)    There are no preventive care reminders to display for this patient.  There are no preventive care reminders to display for this patient.   Lab Results  Component Value Date   TSH 0.79 06/05/2018   Lab Results  Component Value Date   WBC 7.6 06/05/2018   HGB 13.7 06/05/2018   HCT 39.2 06/05/2018   MCV 87.5 06/05/2018   PLT 218 06/05/2018   Lab Results  Component Value Date   NA 137 06/05/2018   K 4.6 06/05/2018   CO2 29 06/05/2018   GLUCOSE 87 06/05/2018   BUN 14 06/05/2018   CREATININE 1.00 (H) 06/05/2018   BILITOT 0.4 06/05/2018   ALKPHOS 67 08/29/2016   AST 14 06/05/2018   ALT 24 06/05/2018   PROT 6.1 06/05/2018   ALBUMIN 3.9 08/29/2016   CALCIUM 9.7 06/05/2018   Lab Results  Component Value Date   CHOL 215 (H) 06/05/2018   Lab Results  Component Value Date   HDL 44 (L) 06/05/2018   Lab Results  Component Value Date   LDLCALC 130 (H) 06/05/2018   Lab Results  Component Value Date   TRIG 266 (H) 06/05/2018   Lab Results  Component Value  Date   CHOLHDL 4.9 06/05/2018   No results found for: HGBA1C     Assessment & Plan:   Problem List Items Addressed This Visit    None    Visit Diagnoses    Fever, unspecified fever cause    -  Primary   Relevant Orders   POCT Influenza A/B (Completed)   COPD exacerbation (HCC)       Relevant Medications   predniSONE (DELTASONE) 20 MG tablet     Flu test was negative.  We will diagnosed with COPD exacerbation today.  Will treat with azithromycin since she was recently treated with Augmentin.  And also give her a 5-day course of prednisone.  Offered to give her a nebulizer treatment here in the office that she had not done 1 today but she says she will do 1 as soon as she gets home and declined for here.  Call if not improving over the next week or suddenly getting worse we can always get a chest x-ray if needed.  Meds ordered this encounter  Medications  . doxycycline (VIBRA-TABS) 100 MG tablet    Sig: Take 1 tablet (100 mg total) by mouth 2 (two) times daily.    Dispense:  20 tablet    Refill:  0  . predniSONE (DELTASONE) 20 MG tablet  Sig: Take 2 tablets (40 mg total) by mouth daily with breakfast.    Dispense:  10 tablet    Refill:  0     Beatrice Lecher, MD

## 2018-10-16 NOTE — Telephone Encounter (Signed)
Patient advised and scheduled.  

## 2018-10-16 NOTE — Telephone Encounter (Signed)
Nancy Cisneros called and states her symptoms was better on the Augmentin but return after she was off the Augmentin. She has a cough, runny nose and chest congestion. Denies fever, chills or sweats. She is wanting another round of the Augmentin. Please advise.

## 2018-10-22 ENCOUNTER — Other Ambulatory Visit: Payer: Self-pay

## 2018-10-22 DIAGNOSIS — J441 Chronic obstructive pulmonary disease with (acute) exacerbation: Secondary | ICD-10-CM

## 2018-10-22 MED ORDER — AMBULATORY NON FORMULARY MEDICATION: 0 refills | Status: AC

## 2018-10-22 MED ORDER — ALBUTEROL SULFATE (2.5 MG/3ML) 0.083% IN NEBU: 2.5000 mg | INHALATION_SOLUTION | Freq: Four times a day (QID) | RESPIRATORY_TRACT | 1 refills | Status: AC

## 2018-10-22 MED ORDER — PREDNISONE 20 MG PO TABS: 40.0000 mg | ORAL_TABLET | Freq: Every day | ORAL | 0 refills | Status: DC

## 2018-10-22 NOTE — Telephone Encounter (Signed)
Patient advised.

## 2018-10-22 NOTE — Telephone Encounter (Signed)
Nancy Cisneros called for a refill on prednisone. She states she was doing better while on the prednisone. Please advise.

## 2018-10-22 NOTE — Telephone Encounter (Signed)
Also she needs a new nebulizer. She would like it sent to [facility].   See first note.

## 2018-10-22 NOTE — Telephone Encounter (Signed)
Order faxed.

## 2018-10-22 NOTE — Telephone Encounter (Signed)
Ok to send rx for nebulizer

## 2018-10-23 DIAGNOSIS — J441 Chronic obstructive pulmonary disease with (acute) exacerbation: Secondary | ICD-10-CM | POA: Diagnosis not present

## 2018-10-24 ENCOUNTER — Ambulatory Visit (INDEPENDENT_AMBULATORY_CARE_PROVIDER_SITE_OTHER): Payer: Medicare HMO | Admitting: Osteopathic Medicine

## 2018-10-24 ENCOUNTER — Encounter: Payer: Self-pay | Admitting: Osteopathic Medicine

## 2018-10-24 ENCOUNTER — Other Ambulatory Visit: Payer: Self-pay | Admitting: *Deleted

## 2018-10-24 ENCOUNTER — Telehealth: Payer: Self-pay | Admitting: *Deleted

## 2018-10-24 ENCOUNTER — Ambulatory Visit (INDEPENDENT_AMBULATORY_CARE_PROVIDER_SITE_OTHER): Payer: Medicare HMO

## 2018-10-24 VITALS — BP 103/57 | HR 77 | Temp 98.0°F | Wt 178.2 lb

## 2018-10-24 DIAGNOSIS — J441 Chronic obstructive pulmonary disease with (acute) exacerbation: Secondary | ICD-10-CM

## 2018-10-24 DIAGNOSIS — R059 Cough, unspecified: Secondary | ICD-10-CM

## 2018-10-24 DIAGNOSIS — R05 Cough: Secondary | ICD-10-CM

## 2018-10-24 DIAGNOSIS — G4733 Obstructive sleep apnea (adult) (pediatric): Secondary | ICD-10-CM

## 2018-10-24 DIAGNOSIS — J9811 Atelectasis: Secondary | ICD-10-CM | POA: Diagnosis not present

## 2018-10-24 MED ORDER — GUAIFENESIN ER 1200 MG PO TB12: 1200.0000 mg | ORAL_TABLET | Freq: Two times a day (BID) | ORAL | 0 refills | Status: DC

## 2018-10-24 MED ORDER — AMBULATORY NON FORMULARY MEDICATION: 0 refills | Status: AC

## 2018-10-24 MED ORDER — AMBULATORY NON FORMULARY MEDICATION: 0 refills | Status: DC

## 2018-10-24 NOTE — Telephone Encounter (Signed)
Pt's husband stopped by and informed me that his wife's nebulizer machine will need to be switched from [place] to [place] to avoid her being charged a monthly fee.  Looking back in her chart it looks like it was originally sent to [facility] and not [facility]. I will ask Levada Dy T about this since she faxed over the order for the nebulizer for her.Elouise Munroe, CMA   I found the fax for the nebulizer and called Shanon Brow and informed him that this is thru [facility] so this does NOT need to be d/c'd. I then asked him about O2 supplies. He stated that he and his wife use O2 at night while sleeping. I then said this is what you are talking about that you will need to have d/c'd thru [place]? He stated yes. I told him that I would work on the orders for this.Elouise Munroe, [place]

## 2018-10-24 NOTE — Patient Instructions (Addendum)
   Try increasing nebulizer use to every 2-4 hours while awake and while feeling breathing or coughing problems.   Can continue [employer]   I also sent Guaifenesin. Stay well-hydrated on this medicine - should help thin mucus and make it easier to cough up.   Remove dark nail polish if you're going to be checking oxygen levels  Can use nasal oxygen at 2L all day if desired

## 2018-10-24 NOTE — Progress Notes (Signed)
HPI: Nancy Cisneros is a 78 y.o. female who  has a past medical history of Adrenal adenoma, CAD (coronary artery disease), COPD (chronic obstructive pulmonary disease) ([place]), Diverticulosis of colon, Hip pain, left, Hyperlipidemia, and Hypertension.  she presents to [facility] today, 10/24/18,  for chief complaint of: SOB  On O2 at home. Known COPD - dx w/ exacerbation 10/16/2018, negative for flu, dx doxy and prednisone, pt declined breathing treatment in office at that time.   Pt reports here today at her daughter's insistence due to just not feeling a whole lot better from her illness. She feels the cough is bothering her, feels like chest is congested and she can't clear it.   Checking SaO2 at home, reported ranging 80's-90s per daughter (Patient is wearing dark nail polish). Using inhalers and home O2 "as needed" - O2 while asleep and "if my oxygen gets low" and using nebs once daily, sometimes twice.          Past medical, surgical, social and family history reviewed and updated as necessary.   Current medication list and allergy/intolerance information reviewed:    Current Outpatient Medications  Medication Sig Dispense Refill  . albuterol (PROAIR HFA) 108 (90 Base) MCG/ACT inhaler Inhale 2-4 puffs into the lungs every 6 (six) hours as needed. 1 Inhaler 3  . albuterol (PROVENTIL) (2.5 MG/3ML) 0.083% nebulizer solution Take 3 mLs (2.5 mg total) by nebulization every 6 (six) hours. And PRN 450 mL 1  . AMBULATORY NON FORMULARY MEDICATION Nebulizer 1 each 0  . amLODipine (NORVASC) 10 MG tablet Take 10 mg by mouth daily.    Marland Kitchen aspirin 81 MG tablet Take 81 mg by mouth daily.      Marland Kitchen buPROPion (WELLBUTRIN XL) 150 MG 24 hr tablet     . cyanocobalamin (,VITAMIN B-12,) 1000 MCG/ML injection INJECT 1 ML SUBQUTANOUSLY EVERY 7 DAYS 30 mL 1  . doxycycline (VIBRA-TABS) 100 MG tablet Take 1 tablet (100 mg total) by mouth 2 (two) times daily. 20 tablet 0  .  esomeprazole (NEXIUM) 40 MG capsule TAKE 1 CAPSULE (40 MG TOTAL) BY MOUTH DAILY. 90 capsule 3  . Fluticasone-Umeclidin-Vilant (TRELEGY ELLIPTA) 100-62.5-25 MCG/INH AEPB Inhale 1 Inhaler into the lungs daily. 1 each PRN  . gabapentin (NEURONTIN) 300 MG capsule Take 300 mg by mouth at bedtime. Take 3 capsules (900 mg) by mouth at bedtime    . HYDROcodone-acetaminophen (NORCO) 10-325 MG tablet     . ipratropium (ATROVENT) 0.02 % nebulizer solution TAKE 2.5 MLS (0.5 MG TOTAL) BY NEBULIZATION 4 (FOUR) TIMES DAILY. 312.5 mL 0  . ketorolac (ACULAR) 0.5 % ophthalmic solution     . losartan (COZAAR) 25 MG tablet Take 1 tablet (25 mg total) by mouth daily. 90 tablet 1  . meloxicam (MOBIC) 15 MG tablet Take 15 mg by mouth every morning.  3  . methocarbamol (ROBAXIN) 750 MG tablet Take 750 mg by mouth daily.    . metoprolol tartrate (LOPRESSOR) 100 MG tablet Take 1 tablet (100 mg total) by mouth 2 (two) times daily. 180 tablet 1  . prednisoLONE acetate (PRED FORTE) 1 % ophthalmic suspension     . predniSONE (DELTASONE) 20 MG tablet Take 2 tablets (40 mg total) by mouth daily with breakfast. 10 tablet 0  . rOPINIRole (REQUIP) 0.5 MG tablet     . sertraline (ZOLOFT) 100 MG tablet Take 1.5 tablets (150 mg total) by mouth daily. 135 tablet 1  . sucralfate (CARAFATE) 1 g tablet  Take 1 tablet (1 g total) by mouth 4 (four) times daily -  with meals and at bedtime. 120 tablet 1  . umeclidinium-vilanterol (ANORO ELLIPTA) 62.5-25 MCG/INH AEPB Inhale 1 puff into the lungs daily. 1 each 0  . valACYclovir (VALTREX) 1000 MG tablet Take 1 g by mouth 3 (three) times daily.  0  . varenicline (CHANTIX PAK) 0.5 MG X 11 & 1 MG X 42 tablet Take one 0.5 mg tablet by mouth once daily for 3 days, then increase to one 0.5 mg tablet twice daily for 4 days, then increase to one 1 mg tablet twice daily. 53 tablet 0  . Guaifenesin 1200 MG TB12 Take 1 tablet (1,200 mg total) by mouth every 12 (twelve) hours. As needed for cough. 14 each 0    No current facility-administered medications for this visit.     Allergies  Allergen Reactions  . Methylprednisolone Sodium Succ Hives  . Codeine     REACTION: trouble breathing  . Ramipril     REACTION: cough  . Solu-Medrol [Methylprednisolone Acetate] Other (See Comments)    nausea and hives      Review of Systems:  Constitutional:  No  fever, no chills, +recent illness,  HEENT: No  headache, no vision change, no hearing change, No sore throat, No  sinus pressure  Cardiac: No  chest pain, No  pressure, No palpitations  Respiratory:  +chronic shortness of breath. +Cough  Gastrointestinal: No  abdominal pain, No  nausea, No  vomiting,  No  blood in stool, No  diarrhea  Musculoskeletal: No new myalgia/arthralgia  Skin: No  Rash  Neurologic: No  weakness, No  dizziness  Exam:  BP (!) 103/57 (BP Location: Left Arm, Patient Position: Sitting, Cuff Size: Normal)   Pulse 77   Temp 98 F (36.7 C) (Oral)   Wt 178 lb 3.2 oz (80.8 kg)   SpO2 97%   BMI 29.65 kg/m   Constitutional: VS see above. General Appearance: alert, well-developed, well-nourished, NAD  Eyes: Normal lids and conjunctive, non-icteric sclera  Ears, Nose, Mouth, Throat: MMM, Normal external inspection ears/nares/mouth/lips/gums.   Neck: No masses, trachea midline. No tenderness/mass appreciated. No lymphadenopathy  Respiratory: Normal respiratory effort. +diffuse coarse breath sounds, No severe wheeze, no rhonchi, no rales  Cardiovascular: S1/S2 w/ gallop, no murmur, auscultated. RRR. No lower extremity edema.   Musculoskeletal: Gait normal.    Psychiatric: Normal judgment/insight. Normal mood and affect.   Dg Chest 2 View  Result Date: 10/24/2018 CLINICAL DATA:  Cough and congestion for 4 weeks. EXAM: CHEST - 2 VIEW COMPARISON:  None. FINDINGS: The heart size and mediastinal contours are within normal limits. Nonaneurysmal atherosclerotic aorta with slight uncoiling. Minimal left basilar  atelectasis is noted. Both lungs are clear. The visualized skeletal structures are unremarkable. IMPRESSION: 1. No active cardiopulmonary disease. 2. Aortic atherosclerosis. 3. Minimal left basilar atelectasis. Electronically Signed   By: Ashley Royalty M.D.   On: 10/24/2018 14:09         ASSESSMENT/PLAN: The primary encounter diagnosis was COPD with exacerbation ([place]). A diagnosis of Cough was also pertinent to this visit.   Meds ordered this encounter  Medications  . Guaifenesin 1200 MG TB12    Sig: Take 1 tablet (1,200 mg total) by mouth every 12 (twelve) hours. As needed for cough.    Dispense:  14 each    Refill:  0    Orders Placed This Encounter  Procedures  . DG Chest 2 View    Patient  Instructions   Try increasing nebulizer use to every 2-4 hours while awake and while feeling breathing or coughing problems.   Can continue [employer]   I also sent Guaifenesin. Stay well-hydrated on this medicine - should help thin mucus and make it easier to cough up.   Remove dark nail polish if you're going to be checking oxygen levels  Can use nasal oxygen at 2L all day if desired         Visit summary with medication list and pertinent instructions was printed for patient to review. All questions at time of visit were answered - patient instructed to contact office with any additional concerns or updates. ER/RTC precautions were reviewed with the patient.   Follow-up plan: Return in about 1 week (around 10/31/2018) for recheck with Dr Madilyn Fireman - see [place] sooner if needed .    Please note: voice recognition software was used to produce this document, and typos may escape review. Please contact Dr. Sheppard Coil for any needed clarifications.

## 2018-10-31 ENCOUNTER — Telehealth: Payer: Self-pay | Admitting: *Deleted

## 2018-10-31 NOTE — Telephone Encounter (Signed)
Order for cpap faxed to [place]. This is the 2nd fax that has been sent.Marland KitchenMarland KitchenMarland KitchenElouise Cisneros, [place]

## 2018-11-01 ENCOUNTER — Ambulatory Visit (INDEPENDENT_AMBULATORY_CARE_PROVIDER_SITE_OTHER): Payer: Medicare HMO | Admitting: Family Medicine

## 2018-11-01 ENCOUNTER — Encounter: Payer: Self-pay | Admitting: Family Medicine

## 2018-11-01 ENCOUNTER — Other Ambulatory Visit: Payer: Self-pay

## 2018-11-01 VITALS — BP 138/70 | HR 80 | Temp 98.2°F | Ht 65.0 in | Wt 182.0 lb

## 2018-11-01 DIAGNOSIS — R059 Cough, unspecified: Secondary | ICD-10-CM

## 2018-11-01 DIAGNOSIS — G500 Trigeminal neuralgia: Secondary | ICD-10-CM

## 2018-11-01 DIAGNOSIS — F439 Reaction to severe stress, unspecified: Secondary | ICD-10-CM

## 2018-11-01 DIAGNOSIS — R05 Cough: Secondary | ICD-10-CM

## 2018-11-01 MED ORDER — HYDROCODONE-HOMATROPINE 5-1.5 MG/5ML PO SYRP: 5.0000 mL | ORAL_SOLUTION | Freq: Every evening | ORAL | 0 refills | Status: DC | PRN

## 2018-11-01 NOTE — Progress Notes (Signed)
Established Patient Office Visit  Subjective:  Patient ID: Nancy Cisneros, female    DOB: Nov 01, 1940  Age: 78 y.o. MRN: [id number]  CC:  Chief Complaint  Patient presents with  . COPD    pt reports that she is not feeling any better.   . facial numbness    L sided facial numbness, she was seen for this last year in October    HPI Nancy Cisneros presents for cough.  Unfortunately she has had several COPD exacerbations and cough since starting in December.  She recently saw 1 of my partners about a week ago.  She is complaining that she is still coughing but had much less mucus.  When she does get a little bit of mucus it has a sandy color to it.  She just feels very tired has had some chills but denies any fever for at least the last couple of weeks.  She denies any sore throat or diarrhea.  She says she does not really feel like the prednisone was very helpful.  She also feels like the [employer] do not really work.  Also been experiencing some left-sided facial numbness which she had previously experienced back in October.  Denies any pain.  It is in the same area.  Back in October we actually referred her to ENT after getting a fairly normal MRI.  They felt like the pain was consistent with trigeminal neuralgia they decided to put her on Tegretol.  She is not currently on the medication.  Is also felt very stressed and overwhelmed with her husband's recent diagnosis of cancer.  In fact yesterday they spent all day at [facility] and at radiation oncology and she just felt very overwhelmed by that experience.  Past Medical History:  Diagnosis Date  . Adrenal adenoma    1 cm Left  . CAD (coronary artery disease)    Dr Prince Rome, [facility] cardiology  . COPD (chronic obstructive pulmonary disease) (H[facility])    lung nodule- Dr Tilden Dome  . Diverticulosis of colon   . Hip pain, left   . Hyperlipidemia   . Hypertension     Past Surgical History:  Procedure Laterality Date  . [place]x mid urethral  sling     Dr Beverley Fiedler    Family History  Problem Relation Age of Onset  . Hyperlipidemia Mother   . Hypertension Mother   . Cancer Father        Oat cell lung CA  . Alcohol abuse Father     Social History   Socioeconomic History  . Marital status: Married    Spouse name: Not on file  . Number of children: Not on file  . Years of education: Not on file  . Highest education level: Not on file  Occupational History  . Not on file  Social Needs  . Financial resource strain: Not on file  . Food insecurity:    Worry: Not on file    Inability: Not on file  . Transportation needs:    Medical: Not on file    Non-medical: Not on file  Tobacco Use  . Smoking status: Former Smoker    Packs/day: 0.40    Last attempt to quit: 03/28/2016    Years since quitting: 2.5  . Smokeless tobacco: Never Used  Substance and Sexual Activity  . Alcohol use: No  . Drug use: Not on file  . Sexual activity: Yes  Lifestyle  . Physical activity:  Days per week: Not on file    Minutes per session: Not on file  . Stress: Not on file  Relationships  . Social connections:    Talks on phone: Not on file    Gets together: Not on file    Attends religious service: Not on file    Active member of club or organization: Not on file    Attends meetings of clubs or organizations: Not on file    Relationship status: Not on file  . Intimate partner violence:    Fear of current or ex partner: Not on file    Emotionally abused: Not on file    Physically abused: Not on file    Forced sexual activity: Not on file  Other Topics Concern  . Not on file  Social History Narrative  . Not on file    Outpatient Medications Prior to Visit  Medication Sig Dispense Refill  . albuterol (PROAIR HFA) 108 (90 Base) MCG/ACT inhaler Inhale 2-4 puffs into the lungs every 6 (six) hours as needed. 1 Inhaler 3  . albuterol (PROVENTIL) (2.5 MG/3ML) 0.083% nebulizer solution Take 3 mLs (2.5 mg total) by  nebulization every 6 (six) hours. And PRN 450 mL 1  . AMBULATORY NON FORMULARY MEDICATION Nebulizer 1 each 0  . AMBULATORY NON FORMULARY MEDICATION Medication Name: Home sleep study through [place]. Dx snoring and OSA 1 vial 0  . amLODipine (NORVASC) 10 MG tablet Take 10 mg by mouth daily.    Marland Kitchen aspirin 81 MG tablet Take 81 mg by mouth daily.      Marland Kitchen buPROPion (WELLBUTRIN XL) 150 MG 24 hr tablet     . cyanocobalamin (,VITAMIN B-12,) 1000 MCG/ML injection INJECT 1 ML SUBQUTANOUSLY EVERY 7 DAYS 30 mL 1  . esomeprazole (NEXIUM) 40 MG capsule TAKE 1 CAPSULE (40 MG TOTAL) BY MOUTH DAILY. 90 capsule 3  . Fluticasone-Umeclidin-Vilant (TRELEGY ELLIPTA) 100-62.5-25 MCG/INH AEPB Inhale 1 Inhaler into the lungs daily. 1 each PRN  . gabapentin (NEURONTIN) 300 MG capsule Take 300 mg by mouth at bedtime. Take 3 capsules (900 mg) by mouth at bedtime    . HYDROcodone-acetaminophen (NORCO) 10-325 MG tablet     . ipratropium (ATROVENT) 0.02 % nebulizer solution TAKE 2.5 MLS (0.5 MG TOTAL) BY NEBULIZATION 4 (FOUR) TIMES DAILY. 312.5 mL 0  . ketorolac (ACULAR) 0.5 % ophthalmic solution     . losartan (COZAAR) 25 MG tablet Take 1 tablet (25 mg total) by mouth daily. 90 tablet 1  . meloxicam (MOBIC) 15 MG tablet Take 15 mg by mouth every morning.  3  . methocarbamol (ROBAXIN) 750 MG tablet Take 750 mg by mouth daily.    . metoprolol tartrate (LOPRESSOR) 100 MG tablet Take 1 tablet (100 mg total) by mouth 2 (two) times daily. 180 tablet 1  . prednisoLONE acetate (PRED FORTE) 1 % ophthalmic suspension     . rOPINIRole (REQUIP) 0.5 MG tablet     . sertraline (ZOLOFT) 100 MG tablet Take 1.5 tablets (150 mg total) by mouth daily. 135 tablet 1  . sucralfate (CARAFATE) 1 g tablet Take 1 tablet (1 g total) by mouth 4 (four) times daily -  with meals and at bedtime. 120 tablet 1  . umeclidinium-vilanterol (ANORO ELLIPTA) 62.5-25 MCG/INH AEPB Inhale 1 puff into the lungs daily. 1 each 0  . valACYclovir (VALTREX) 1000 MG tablet  Take 1 g by mouth 3 (three) times daily.  0  . varenicline (CHANTIX PAK) 0.5 MG X 11 & 1  MG X 42 tablet Take one 0.5 mg tablet by mouth once daily for 3 days, then increase to one 0.5 mg tablet twice daily for 4 days, then increase to one 1 mg tablet twice daily. 53 tablet 0  . predniSONE (DELTASONE) 20 MG tablet Take 2 tablets (40 mg total) by mouth daily with breakfast. 10 tablet 0  . Guaifenesin 1200 MG TB12 Take 1 tablet (1,200 mg total) by mouth every 12 (twelve) hours. As needed for cough. 14 each 0   No facility-administered medications prior to visit.     Allergies  Allergen Reactions  . Methylprednisolone Sodium Succ Hives  . Codeine     REACTION: trouble breathing  . Ramipril     REACTION: cough  . Solu-Medrol [Methylprednisolone Acetate] Other (See Comments)    nausea and hives    ROS Review of Systems    Objective:    Physical Exam  Constitutional: She is oriented to person, place, and time. She appears well-developed and well-nourished.  HENT:  Head: Normocephalic and atraumatic.  Right Ear: External ear normal.  Left Ear: External ear normal.  Nose: Nose normal.  Mouth/Throat: Oropharynx is clear and moist.  TMs and canals are clear.   Eyes: Pupils are equal, round, and reactive to light. Conjunctivae and EOM are normal.  Neck: Neck supple. No thyromegaly present.  Cardiovascular: Normal rate, regular rhythm and normal heart sounds.  Pulmonary/Chest: Effort normal. She has no wheezes.  Diffuse rhonchi  Lymphadenopathy:    She has no cervical adenopathy.  Neurological: She is alert and oriented to person, place, and time.  Skin: Skin is warm and dry.  Psychiatric: She has a normal mood and affect.    BP 138/70   Pulse 80   Temp 98.2 F (36.8 C)   Ht 5\' 5"  (1.651 m)   Wt 182 lb (82.6 kg)   SpO2 94%   BMI 30.29 kg/m  Wt Readings from Last 3 Encounters:  11/01/18 182 lb (82.6 kg)  10/24/18 178 lb 3.2 oz (80.8 kg)  10/16/18 179 lb (81.2 kg)      There are no preventive care reminders to display for this patient.  There are no preventive care reminders to display for this patient.  Lab Results  Component Value Date   TSH 0.79 06/05/2018   Lab Results  Component Value Date   WBC 7.6 06/05/2018   HGB 13.7 06/05/2018   HCT 39.2 06/05/2018   MCV 87.5 06/05/2018   PLT 218 06/05/2018   Lab Results  Component Value Date   NA 137 06/05/2018   K 4.6 06/05/2018   CO2 29 06/05/2018   GLUCOSE 87 06/05/2018   BUN 14 06/05/2018   CREATININE 1.00 (H) 06/05/2018   BILITOT 0.4 06/05/2018   ALKPHOS 67 08/29/2016   AST 14 06/05/2018   ALT 24 06/05/2018   PROT 6.1 06/05/2018   ALBUMIN 3.9 08/29/2016   CALCIUM 9.7 06/05/2018   Lab Results  Component Value Date   CHOL 215 (H) 06/05/2018   Lab Results  Component Value Date   HDL 44 (L) 06/05/2018   Lab Results  Component Value Date   LDLCALC 130 (H) 06/05/2018   Lab Results  Component Value Date   TRIG 266 (H) 06/05/2018   Lab Results  Component Value Date   CHOLHDL 4.9 06/05/2018   No results found for: HGBA1C    Assessment & Plan:   Problem List Items Addressed This Visit    None  Visit Diagnoses    Cough    -  Primary   Relevant Orders   CBC with Differential/Platelet   COMPLETE METABOLIC PANEL WITH GFR   Trigeminal neuralgia       Stress          Cough-I think at this point is most consistent with a postinfectious cough.  She does have some diffuse rhonchi with her underlying COPD but actually do feel like she is improved from a week ago even though she is still concerned that she is not better.  We did do a 6-minute walk test which she passed.  But she has been wearing her oxygen at night which I think is a great idea.  I want to continue to monitor her over the next week or 2 without any other interventions except for continuing to use her albuterol liberally.  Trigeminal neuralgia of the left side of the face-unclear why her symptoms are  suddenly back but we will check some blood work today including a CBC.   Acute stress -think that her current stressors are an underlying part of her recovery.  Tenuous Zoloft.  Meds ordered this encounter  Medications  . HYDROcodone-homatropine (HYCODAN) 5-1.5 MG/5ML syrup    Sig: Take 5 mLs by mouth at bedtime as needed for cough.    Dispense:  120 mL    Refill:  0    Follow-up: Return in about 2 weeks (around 11/15/2018).    Beatrice Lecher, MD

## 2018-11-02 ENCOUNTER — Other Ambulatory Visit: Payer: Self-pay | Admitting: *Deleted

## 2018-11-02 DIAGNOSIS — F339 Major depressive disorder, recurrent, unspecified: Secondary | ICD-10-CM

## 2018-11-02 DIAGNOSIS — J441 Chronic obstructive pulmonary disease with (acute) exacerbation: Secondary | ICD-10-CM

## 2018-11-02 LAB — COMPLETE METABOLIC PANEL WITH GFR
AG Ratio: 1.8 (calc) (ref 1.0–2.5)
ALT: 9 U/L (ref 6–29)
AST: 11 U/L (ref 10–35)
Albumin: 3.7 g/dL (ref 3.6–5.1)
Alkaline phosphatase (APISO): 62 U/L (ref 37–153)
BUN: 11 mg/dL (ref 7–25)
CO2: 23 mmol/L (ref 20–32)
Calcium: 9 mg/dL (ref 8.6–10.4)
Chloride: 106 mmol/L (ref 98–110)
Creat: 0.89 mg/dL (ref 0.60–0.93)
GFR, Est African American: 71 mL/min/{1.73_m2} (ref >=60)
GFR, Est Non African American: 62 mL/min/{1.73_m2} (ref >=60)
GLOBULIN: 2.1 g/dL (ref 1.9–3.7)
Glucose, Bld: 74 mg/dL (ref 65–99)
Potassium: 4.3 mmol/L (ref 3.5–5.3)
SODIUM: 140 mmol/L (ref 135–146)
Total Bilirubin: 0.4 mg/dL (ref 0.2–1.2)
Total Protein: 5.8 g/dL — ABNORMAL LOW (ref 6.1–8.1)

## 2018-11-02 LAB — CBC WITH DIFFERENTIAL/PLATELET
Absolute Monocytes: 650 cells/uL (ref 200–950)
Basophils Absolute: 30 cells/uL (ref 0–200)
Basophils Relative: 0.3 %
Eosinophils Absolute: 110 cells/uL (ref 15–500)
Eosinophils Relative: 1.1 %
HCT: 36 % (ref 35.0–45.0)
Hemoglobin: 12.3 g/dL (ref 11.7–15.5)
Lymphs Abs: 2130 cells/uL (ref 850–3900)
MCH: 30.4 pg (ref 27.0–33.0)
MCHC: 34.2 g/dL (ref 32.0–36.0)
MCV: 89.1 fL (ref 80.0–100.0)
MPV: 11.8 fL (ref 7.5–12.5)
Monocytes Relative: 6.5 %
Neutro Abs: 7080 cells/uL (ref 1500–7800)
Neutrophils Relative %: 70.8 %
Platelets: 243 10*3/uL (ref 140–400)
RBC: 4.04 10*6/uL (ref 3.80–5.10)
RDW: 13.9 % (ref 11.0–15.0)
Total Lymphocyte: 21.3 %
WBC: 10 10*3/uL (ref 3.8–10.8)

## 2018-11-02 MED ORDER — FLUTICASONE-UMECLIDIN-VILANT 100-62.5-25 MCG/INH IN AEPB: 1.0000 | INHALATION_SPRAY | Freq: Every day | RESPIRATORY_TRACT | 99 refills | Status: AC

## 2018-11-02 MED ORDER — BUPROPION HCL ER (XL) 150 MG PO TB24: 150.0000 mg | ORAL_TABLET | ORAL | 1 refills | Status: DC

## 2018-11-06 NOTE — Telephone Encounter (Signed)
Pt called sating she contacted [facility] and they still did not have her CPAP order/sleep test for night oxygen. I called [facility] [phone]) and spoke with Jeanett Schlein who advised me to fax info to her at [phone]) and it would go directly to her and be taken care of.   Faxed info for third time, pt advised. Gave RX and info back to [place] to put back in her storage

## 2018-11-12 DIAGNOSIS — J189 Pneumonia, unspecified organism: Secondary | ICD-10-CM | POA: Diagnosis not present

## 2018-11-23 DIAGNOSIS — J441 Chronic obstructive pulmonary disease with (acute) exacerbation: Secondary | ICD-10-CM | POA: Diagnosis not present

## 2018-12-13 DIAGNOSIS — J189 Pneumonia, unspecified organism: Secondary | ICD-10-CM | POA: Diagnosis not present

## 2018-12-13 DIAGNOSIS — M545 Low back pain: Secondary | ICD-10-CM | POA: Diagnosis not present

## 2018-12-13 DIAGNOSIS — G5603 Carpal tunnel syndrome, bilateral upper limbs: Secondary | ICD-10-CM | POA: Diagnosis not present

## 2018-12-13 DIAGNOSIS — G603 Idiopathic progressive neuropathy: Secondary | ICD-10-CM | POA: Diagnosis not present

## 2018-12-13 DIAGNOSIS — G2581 Restless legs syndrome: Secondary | ICD-10-CM | POA: Diagnosis not present

## 2018-12-13 DIAGNOSIS — Z79899 Other long term (current) drug therapy: Secondary | ICD-10-CM | POA: Diagnosis not present

## 2018-12-23 DIAGNOSIS — J441 Chronic obstructive pulmonary disease with (acute) exacerbation: Secondary | ICD-10-CM | POA: Diagnosis not present

## 2019-01-17 ENCOUNTER — Other Ambulatory Visit: Payer: Self-pay | Admitting: Family Medicine

## 2019-01-23 DIAGNOSIS — J441 Chronic obstructive pulmonary disease with (acute) exacerbation: Secondary | ICD-10-CM | POA: Diagnosis not present

## 2019-02-22 DIAGNOSIS — J441 Chronic obstructive pulmonary disease with (acute) exacerbation: Secondary | ICD-10-CM | POA: Diagnosis not present

## 2019-02-22 DIAGNOSIS — H5203 Hypermetropia, bilateral: Secondary | ICD-10-CM | POA: Diagnosis not present

## 2019-02-22 DIAGNOSIS — H16223 Keratoconjunctivitis sicca, not specified as Sjogren's, bilateral: Secondary | ICD-10-CM | POA: Diagnosis not present

## 2019-02-22 DIAGNOSIS — Z961 Presence of intraocular lens: Secondary | ICD-10-CM | POA: Diagnosis not present

## 2019-03-07 DIAGNOSIS — M5441 Lumbago with sciatica, right side: Secondary | ICD-10-CM | POA: Diagnosis not present

## 2019-03-07 DIAGNOSIS — G8929 Other chronic pain: Secondary | ICD-10-CM | POA: Diagnosis not present

## 2019-03-07 DIAGNOSIS — G603 Idiopathic progressive neuropathy: Secondary | ICD-10-CM | POA: Diagnosis not present

## 2019-03-07 DIAGNOSIS — Z79899 Other long term (current) drug therapy: Secondary | ICD-10-CM | POA: Diagnosis not present

## 2019-03-07 DIAGNOSIS — G2581 Restless legs syndrome: Secondary | ICD-10-CM | POA: Diagnosis not present

## 2019-03-07 DIAGNOSIS — M5442 Lumbago with sciatica, left side: Secondary | ICD-10-CM | POA: Diagnosis not present

## 2019-03-08 DIAGNOSIS — Z720 Tobacco use: Secondary | ICD-10-CM | POA: Diagnosis not present

## 2019-03-08 DIAGNOSIS — J441 Chronic obstructive pulmonary disease with (acute) exacerbation: Secondary | ICD-10-CM | POA: Diagnosis not present

## 2019-03-08 DIAGNOSIS — I2510 Atherosclerotic heart disease of native coronary artery without angina pectoris: Secondary | ICD-10-CM | POA: Diagnosis not present

## 2019-03-08 DIAGNOSIS — I10 Essential (primary) hypertension: Secondary | ICD-10-CM | POA: Diagnosis not present

## 2019-03-08 DIAGNOSIS — R55 Syncope and collapse: Secondary | ICD-10-CM | POA: Diagnosis not present

## 2019-03-25 DIAGNOSIS — J441 Chronic obstructive pulmonary disease with (acute) exacerbation: Secondary | ICD-10-CM | POA: Diagnosis not present

## 2019-03-28 ENCOUNTER — Other Ambulatory Visit: Payer: Self-pay

## 2019-03-28 MED ORDER — SUCRALFATE 1 G PO TABS: 1.0000 g | ORAL_TABLET | Freq: Three times a day (TID) | ORAL | 1 refills | Status: DC

## 2019-03-28 NOTE — Telephone Encounter (Signed)
Refilled carafate per patient's request. She has an upset stomach. She states if it worsens or doesn't resolve she will call next week for an appointment.

## 2019-04-10 ENCOUNTER — Other Ambulatory Visit: Payer: Self-pay

## 2019-04-10 ENCOUNTER — Ambulatory Visit (INDEPENDENT_AMBULATORY_CARE_PROVIDER_SITE_OTHER): Payer: Medicare HMO | Admitting: Family Medicine

## 2019-04-10 ENCOUNTER — Telehealth: Payer: Self-pay | Admitting: Family Medicine

## 2019-04-10 ENCOUNTER — Encounter: Payer: Self-pay | Admitting: Family Medicine

## 2019-04-10 VITALS — BP 138/69 | HR 67 | Temp 98.4°F | Ht 65.0 in | Wt 177.0 lb

## 2019-04-10 DIAGNOSIS — G500 Trigeminal neuralgia: Secondary | ICD-10-CM | POA: Diagnosis not present

## 2019-04-10 DIAGNOSIS — H538 Other visual disturbances: Secondary | ICD-10-CM | POA: Diagnosis not present

## 2019-04-10 DIAGNOSIS — Z23 Encounter for immunization: Secondary | ICD-10-CM

## 2019-04-10 DIAGNOSIS — I10 Essential (primary) hypertension: Secondary | ICD-10-CM

## 2019-04-10 MED ORDER — CARBAMAZEPINE ER 100 MG PO TB12: 100.0000 mg | ORAL_TABLET | Freq: Two times a day (BID) | ORAL | 0 refills | Status: DC

## 2019-04-10 NOTE — Assessment & Plan Note (Signed)
I am concerned about the episodes that have been going on for about 2-3 years. She has never discussed this with her eye doc. I did encourage her to do so.  She did have MRI last fall with no acute finding, some ischemic change.

## 2019-04-10 NOTE — Progress Notes (Signed)
Established Patient Office Visit  Subjective:  Patient ID: Nancy Cisneros, female    DOB: [birth year]  Age: 78 y.o. MRN: [id number]  CC:  Chief Complaint  Patient presents with  . facial numbness    HPI Nancy Cisneros presents for 3 weeks of left sided face numbness over her left forehead left facial cheek and left chin.  It does not go past the ear and she feels like it stops exactly midline.[place] Scrape like she has had novocaine shot in her face.   feels like her vision has been blurry on and off. Happened earlier today and lasted about 1.5 hours.  Hasn't seen her eye doc. Has an Neurology appt next month.  Had a slight HA on the right side of her head.  She was dx with trigeminal neuralgia last fall and was placed on Tegretol.  At the time she was having more intermittent sharp shooting pain in the left side of her face with some numbness.  She was followed by [facility].  She also had an MRI of the head in October 2019 around the time that she initially presented with similar symptoms and it was negative.  Just showed some chronic ischemic changes.  He does feel like she is getting a little bit of drooling and difficulty drinking fluids on that left corner of the mouth.  She denies any major speech changes or effect on her memory.  She denies any changes in strength or weakness in her extremities.  She does report that she feels like she is a little bit off balance but denies any dizziness or room spinning.  She also reports intermittent blurry vision.  She says actually been going on for about 2 to 3 years but she is noticed it a little bit more recently again.  She says she will have these periods where maybe for an hour or so things will feel blurry.  She says some most like turning the TV to a station that does not work well.  She actually just had left cataract surgery.  Besides these intermittent blurry episode she actually reports that she has great vision.  Past Medical  History:  Diagnosis Date  . Adrenal adenoma    1 cm Left  . CAD (coronary artery disease)    Dr Prince Rome, [facility] cardiology  . COPD (chronic obstructive pulmonary disease) (HCC)    lung nodule- Dr Tilden Dome  . Diverticulosis of colon   . Hip pain, left   . Hyperlipidemia   . Hypertension     Past Surgical History:  Procedure Laterality Date  . Lynx mid urethral sling     Dr Beverley Fiedler    Family History  Problem Relation Age of Onset  . Hyperlipidemia Mother   . Hypertension Mother   . Cancer Father        Oat cell lung CA  . Alcohol abuse Father     Social History   Socioeconomic History  . Marital status: Married    Spouse name: Not on file  . Number of children: Not on file  . Years of education: Not on file  . Highest education level: Not on file  Occupational History  . Not on file  Social Needs  . Financial resource strain: Not on file  . Food insecurity    Worry: Not on file    Inability: Not on file  . Transportation needs    Medical: Not on file  Non-medical: Not on file  Tobacco Use  . Smoking status: Former Smoker    Packs/day: 0.40    Quit date: 03/28/2016    Years since quitting: 3.0  . Smokeless tobacco: Never Used  Substance and Sexual Activity  . Alcohol use: No  . Drug use: Not on file  . Sexual activity: Yes  Lifestyle  . Physical activity    Days per week: Not on file    Minutes per session: Not on file  . Stress: Not on file  Relationships  . Social Herbalist on phone: Not on file    Gets together: Not on file    Attends religious service: Not on file    Active member of club or organization: Not on file    Attends meetings of clubs or organizations: Not on file    Relationship status: Not on file  . Intimate partner violence    Fear of current or ex partner: Not on file    Emotionally abused: Not on file    Physically abused: Not on file    Forced sexual activity: Not on file  Other Topics Concern  . Not on file   Social History Narrative  . Not on file    Outpatient Medications Prior to Visit  Medication Sig Dispense Refill  . albuterol (PROAIR HFA) 108 (90 Base) MCG/ACT inhaler Inhale 2-4 puffs into the lungs every 6 (six) hours as needed. 1 Inhaler 3  . albuterol (PROVENTIL) (2.5 MG/3ML) 0.083% nebulizer solution Take 3 mLs (2.5 mg total) by nebulization every 6 (six) hours. And PRN 450 mL 1  . AMBULATORY NON FORMULARY MEDICATION Nebulizer 1 each 0  . AMBULATORY NON FORMULARY MEDICATION Medication Name: Home sleep study through [facility]. Dx snoring and OSA 1 vial 0  . amLODipine (NORVASC) 10 MG tablet Take 10 mg by mouth daily.    Marland Kitchen aspirin 81 MG tablet Take 81 mg by mouth daily.      Marland Kitchen buPROPion (WELLBUTRIN XL) 150 MG 24 hr tablet Take 1 tablet (150 mg total) by mouth every morning. 90 tablet 1  . cyanocobalamin (,VITAMIN B-12,) 1000 MCG/ML injection INJECT 1 ML SUBQUTANOUSLY EVERY 7 DAYS 30 mL 1  . esomeprazole (NEXIUM) 40 MG capsule TAKE 1 CAPSULE (40 MG TOTAL) BY MOUTH DAILY. 90 capsule 3  . Fluticasone-Umeclidin-Vilant (TRELEGY ELLIPTA) 100-62.5-25 MCG/INH AEPB Inhale 1 Inhaler into the lungs daily. 60 each PRN  . gabapentin (NEURONTIN) 300 MG capsule Take 300 mg by mouth at bedtime. Take 3 capsules (900 mg) by mouth at bedtime    . HYDROcodone-acetaminophen (NORCO) 10-325 MG tablet     . ipratropium (ATROVENT) 0.02 % nebulizer solution TAKE 2.5 MLS (0.5 MG TOTAL) BY NEBULIZATION 4 (FOUR) TIMES DAILY. 312.5 mL 0  . ketorolac (ACULAR) 0.5 % ophthalmic solution     . losartan (COZAAR) 25 MG tablet TAKE 1 TABLET EVERY DAY 90 tablet 1  . meloxicam (MOBIC) 15 MG tablet Take 15 mg by mouth every morning.  3  . methocarbamol (ROBAXIN) 750 MG tablet Take 750 mg by mouth daily.    . metoprolol tartrate (LOPRESSOR) 100 MG tablet Take 1 tablet (100 mg total) by mouth 2 (two) times daily. 180 tablet 1  . prednisoLONE acetate (PRED FORTE) 1 % ophthalmic suspension     . rOPINIRole (REQUIP) 0.5 MG tablet      . sertraline (ZOLOFT) 100 MG tablet Take 1.5 tablets (150 mg total) by mouth daily. 135 tablet 1  .  sucralfate (CARAFATE) 1 g tablet Take 1 tablet (1 g total) by mouth 4 (four) times daily -  with meals and at bedtime. 120 tablet 1  . valACYclovir (VALTREX) 1000 MG tablet Take 1 g by mouth 3 (three) times daily.  0  . varenicline (CHANTIX PAK) 0.5 MG X 11 & 1 MG X 42 tablet Take one 0.5 mg tablet by mouth once daily for 3 days, then increase to one 0.5 mg tablet twice daily for 4 days, then increase to one 1 mg tablet twice daily. 53 tablet 0   No facility-administered medications prior to visit.     Allergies  Allergen Reactions  . Methylprednisolone Sodium Succ Hives  . Codeine     REACTION: trouble breathing  . Ramipril     REACTION: cough  . Solu-Medrol [Methylprednisolone Acetate] Other (See Comments)    nausea and hives    ROS Review of Systems    Objective:    Physical Exam  Constitutional: She is oriented to person, place, and time. She appears well-developed and well-nourished.  HENT:  Head: Normocephalic and atraumatic.  Right Ear: External ear normal.  Left Ear: External ear normal.  Nose: Nose normal.  Mouth/Throat: Oropharynx is clear and moist.  TMs and canals are clear.   Eyes: Pupils are equal, round, and reactive to light. Conjunctivae and EOM are normal.  Neck: Neck supple. No thyromegaly present.  Cardiovascular: Normal rate, regular rhythm and normal heart sounds.  Pulmonary/Chest: Effort normal and breath sounds normal. She has no wheezes.  Lymphadenopathy:    She has no cervical adenopathy.  Neurological: She is alert and oriented to person, place, and time. No cranial nerve deficit. She exhibits normal muscle tone. Coordination normal.  1+ patellar reflex on left, 0+ On the right (prior knee replacement). Strength is 5/5 in UE and LE.  Rapid alternating movement are normal.   Skin: Skin is warm and dry.  Psychiatric: She has a normal mood and  affect. Her behavior is normal.    BP 138/69   Pulse 67   Temp 98.4 F (36.9 C)   Ht 5\' 5"  (1.651 m)   Wt 177 lb (80.3 kg)   SpO2 98%   BMI 29.45 kg/m  Wt Readings from Last 3 Encounters:  04/10/19 177 lb (80.3 kg)  11/01/18 182 lb (82.6 kg)  10/24/18 178 lb 3.2 oz (80.8 kg)     Health Maintenance Due  Topic Date Due  . INFLUENZA VACCINE  03/23/2019    There are no preventive care reminders to display for this patient.  Lab Results  Component Value Date   TSH 0.79 06/05/2018   Lab Results  Component Value Date   WBC 10.0 11/01/2018   HGB 12.3 11/01/2018   HCT 36.0 11/01/2018   MCV 89.1 11/01/2018   PLT 243 11/01/2018   Lab Results  Component Value Date   NA 140 11/01/2018   K 4.3 11/01/2018   CO2 23 11/01/2018   GLUCOSE 74 11/01/2018   BUN 11 11/01/2018   CREATININE 0.89 11/01/2018   BILITOT 0.4 11/01/2018   ALKPHOS 67 08/29/2016   AST 11 11/01/2018   ALT 9 11/01/2018   PROT 5.8 (L) 11/01/2018   ALBUMIN 3.9 08/29/2016   CALCIUM 9.0 11/01/2018   Lab Results  Component Value Date   CHOL 215 (H) 06/05/2018   Lab Results  Component Value Date   HDL 44 (L) 06/05/2018   Lab Results  Component Value Date   LDLCALC 130 (  H) 06/05/2018   Lab Results  Component Value Date   TRIG 266 (H) 06/05/2018   Lab Results  Component Value Date   CHOLHDL 4.9 06/05/2018   No results found for: HGBA1C    Assessment & Plan:   Problem List Items Addressed This Visit      Cardiovascular and Mediastinum   HYPERTENSION, BENIGN SYSTEMIC    Well controlled. Continue current regimen. Follow up in  6 mo.       Relevant Orders   COMPLETE METABOLIC PANEL WITH GFR     Nervous and Auditory   Trigeminal neuralgia - Primary    Based on her symptoms I do think this is a recurrence of her TN.  No sign of weakness in the facial nerve.  ENT exam is normal.        Relevant Orders   COMPLETE METABOLIC PANEL WITH GFR     Other   Blurry vision    I am concerned  about the episodes that have been going on for about 2-3 years. She has never discussed this with her eye doc. I did encourage her to do so.  She did have MRI last fall with no acute finding, some ischemic change.        Other Visit Diagnoses    Need for immunization against influenza       Relevant Orders   Flu Vaccine QUAD High Dose(Fluad) (Completed)      No orders of the defined types were placed in this encounter.   Follow-up: No follow-ups on file.    Beatrice Lecher, MD

## 2019-04-10 NOTE — Telephone Encounter (Signed)
Please call her neurologist - Dr. Darvin Neighbours and see if has something earlier than her regular appt. She his having recurrent of left sided facial numbness.

## 2019-04-10 NOTE — Assessment & Plan Note (Signed)
Based on her symptoms I do think this is a recurrence of her TN.  No sign of weakness in the facial nerve.  ENT exam is normal.

## 2019-04-10 NOTE — Assessment & Plan Note (Signed)
Well controlled. Continue current regimen. Follow up in  6 mo  

## 2019-04-11 LAB — COMPLETE METABOLIC PANEL WITH GFR
AG Ratio: 1.9 (calc) (ref 1.0–2.5)
ALT: 10 U/L (ref 6–29)
AST: 15 U/L (ref 10–35)
Albumin: 4.1 g/dL (ref 3.6–5.1)
Alkaline phosphatase (APISO): 71 U/L (ref 37–153)
BUN/Creatinine Ratio: 16 (calc) (ref 6–22)
BUN: 15 mg/dL (ref 7–25)
CO2: 26 mmol/L (ref 20–32)
Calcium: 9.8 mg/dL (ref 8.6–10.4)
Chloride: 102 mmol/L (ref 98–110)
Creat: 0.96 mg/dL — ABNORMAL HIGH (ref 0.60–0.88)
GFR, Est African American: 65 mL/min/{1.73_m2} (ref >=60)
GFR, Est Non African American: 56 mL/min/{1.73_m2} — ABNORMAL LOW (ref >=60)
Globulin: 2.2 g/dL (calc) (ref 1.9–3.7)
Glucose, Bld: 100 mg/dL — ABNORMAL HIGH (ref 65–99)
Potassium: 4.9 mmol/L (ref 3.5–5.3)
Sodium: 137 mmol/L (ref 135–146)
Total Bilirubin: 0.5 mg/dL (ref 0.2–1.2)
Total Protein: 6.3 g/dL (ref 6.1–8.1)

## 2019-04-11 NOTE — Progress Notes (Signed)
All labs are normal. 

## 2019-04-12 NOTE — Telephone Encounter (Signed)
Nancy Cisneros has been scheduled for Wednesday August 26th at 10:10. Patient is aware.   Boyd Kerbs D MD Doctor in [place], [place]  Address: [address], [place], [place] [address]  Phone:   [phone]

## 2019-04-15 ENCOUNTER — Telehealth: Payer: Self-pay

## 2019-04-15 DIAGNOSIS — R110 Nausea: Secondary | ICD-10-CM

## 2019-04-15 DIAGNOSIS — R202 Paresthesia of skin: Secondary | ICD-10-CM

## 2019-04-15 DIAGNOSIS — H538 Other visual disturbances: Secondary | ICD-10-CM

## 2019-04-15 NOTE — Telephone Encounter (Signed)
Leafy Ro, Josefina's daughter, called and states Nancy Cisneros is now having nausea. Her blood pressure this morning was 150/87. She is wanting to know if she can go ahead and have a CT or MRI. Please advise.

## 2019-04-15 NOTE — Telephone Encounter (Signed)
MRI ordered. Will need to call insurance for authorization.

## 2019-04-15 NOTE — Telephone Encounter (Signed)
Nancy Cisneros called back and states she is also having dizziness.

## 2019-04-16 NOTE — Telephone Encounter (Signed)
Apolonio Schneiders is working on it today. Nancy Cisneros,CMA

## 2019-04-17 DIAGNOSIS — M5417 Radiculopathy, lumbosacral region: Secondary | ICD-10-CM | POA: Diagnosis not present

## 2019-04-17 DIAGNOSIS — Z79899 Other long term (current) drug therapy: Secondary | ICD-10-CM | POA: Diagnosis not present

## 2019-04-17 DIAGNOSIS — G500 Trigeminal neuralgia: Secondary | ICD-10-CM | POA: Diagnosis not present

## 2019-04-17 DIAGNOSIS — G2581 Restless legs syndrome: Secondary | ICD-10-CM | POA: Diagnosis not present

## 2019-04-19 ENCOUNTER — Other Ambulatory Visit: Payer: Self-pay | Admitting: Family Medicine

## 2019-04-21 ENCOUNTER — Other Ambulatory Visit: Payer: Self-pay

## 2019-04-21 ENCOUNTER — Ambulatory Visit (INDEPENDENT_AMBULATORY_CARE_PROVIDER_SITE_OTHER): Payer: Medicare HMO

## 2019-04-21 DIAGNOSIS — R209 Unspecified disturbances of skin sensation: Secondary | ICD-10-CM | POA: Diagnosis not present

## 2019-04-21 DIAGNOSIS — H538 Other visual disturbances: Secondary | ICD-10-CM

## 2019-04-21 DIAGNOSIS — R9082 White matter disease, unspecified: Secondary | ICD-10-CM | POA: Diagnosis not present

## 2019-04-21 DIAGNOSIS — R110 Nausea: Secondary | ICD-10-CM | POA: Diagnosis not present

## 2019-04-21 DIAGNOSIS — R202 Paresthesia of skin: Secondary | ICD-10-CM

## 2019-04-22 IMAGING — DX DG LUMBAR SPINE COMPLETE 4+V
5 series · 5 of 5 positions shown · non-contrast
Comparison: AP and lateral views of the lumbar spine August 13, 2009

CLINICAL DATA: Bilateral low back pain with radicular symptoms for
several years. No specific history of injury

EXAM:
LUMBAR SPINE - COMPLETE 4+ VIEW

[l-spine ap]
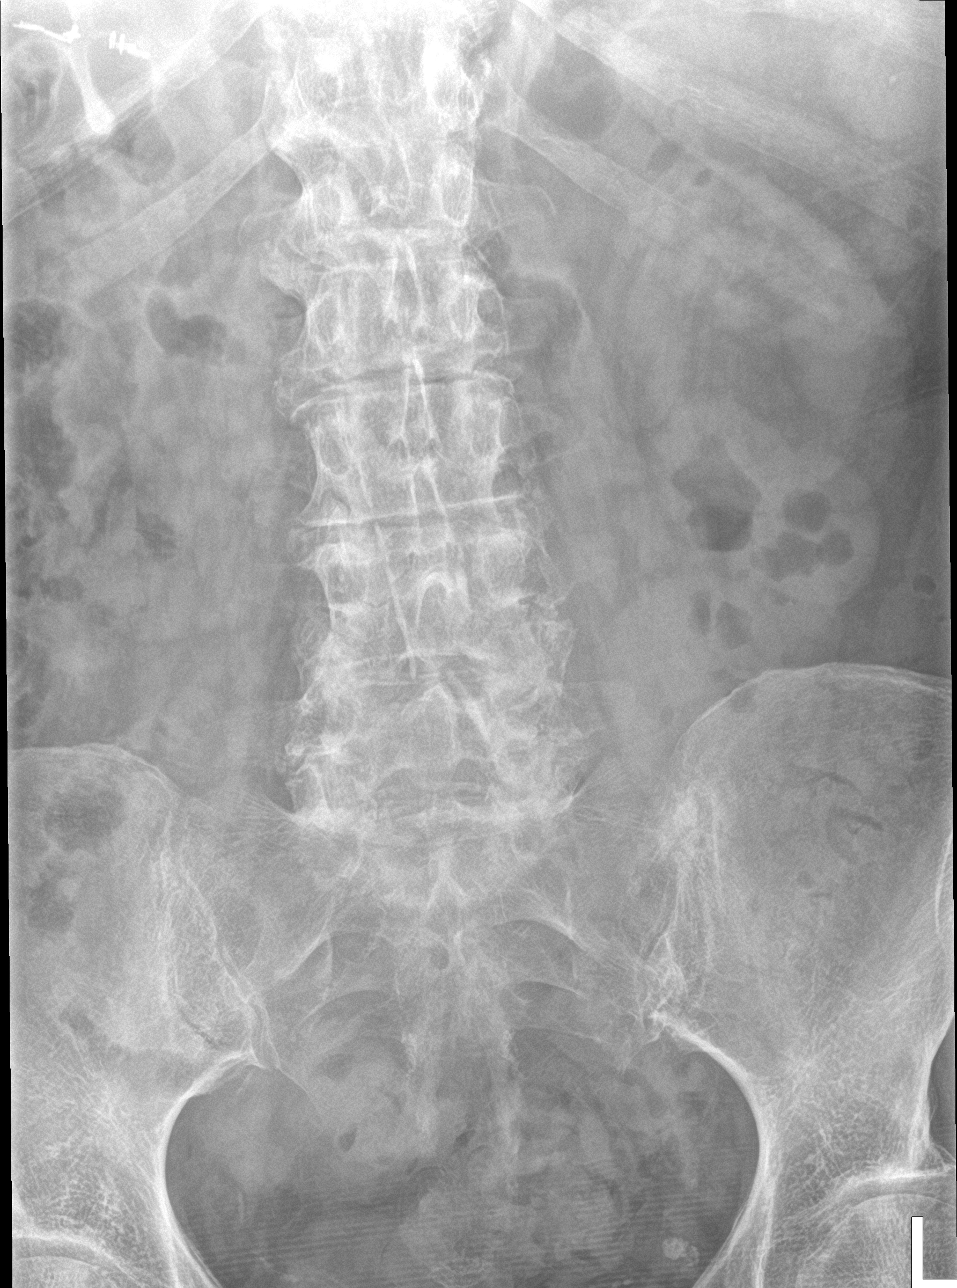

[l-spine obl (1 of 2)]
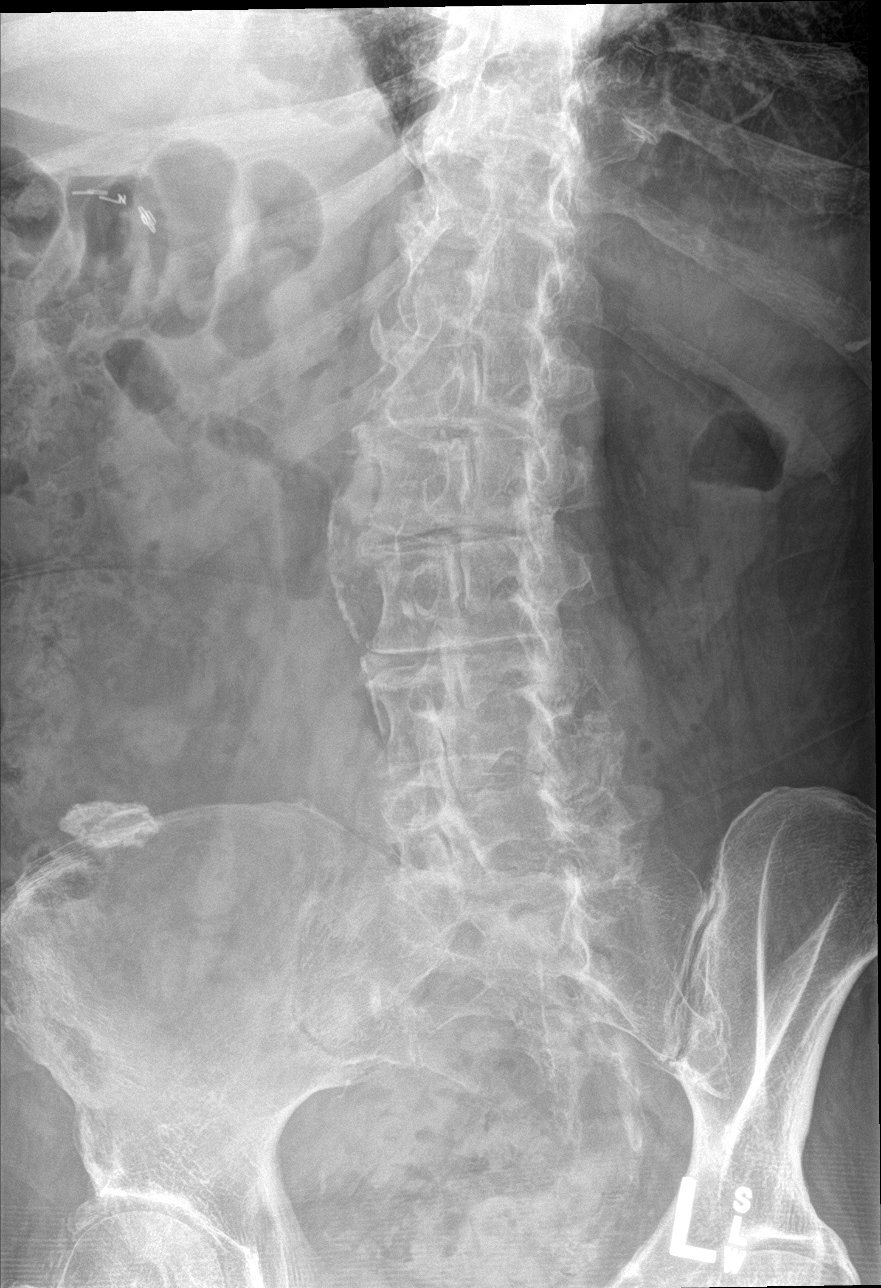

[l-spine obl (2 of 2)]
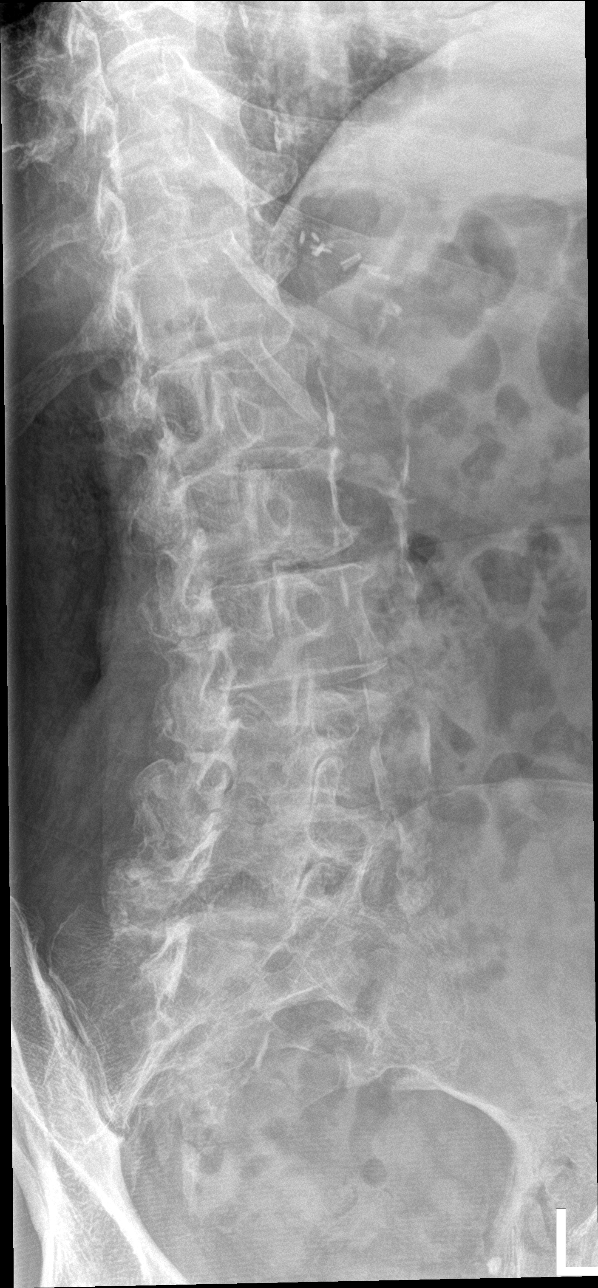

[l-spine lat]
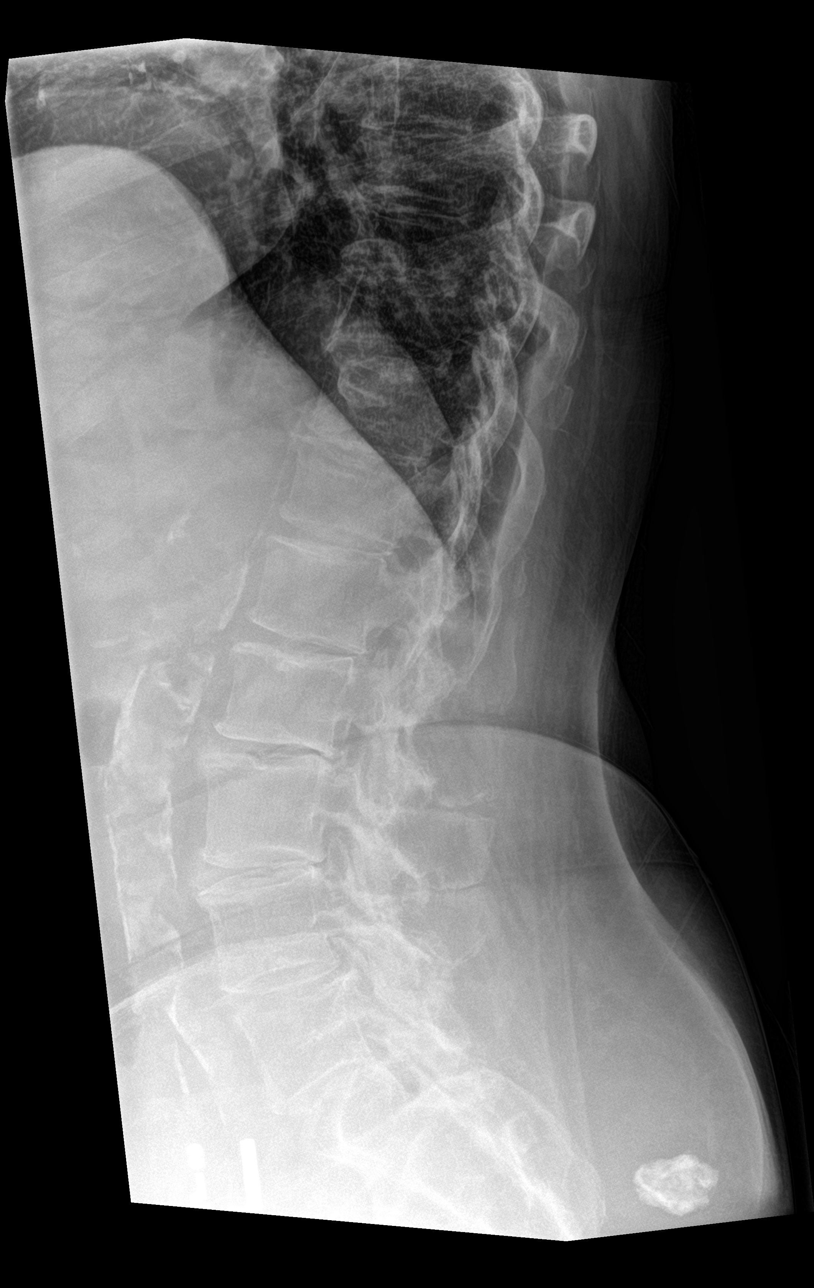

[l-spine spot]
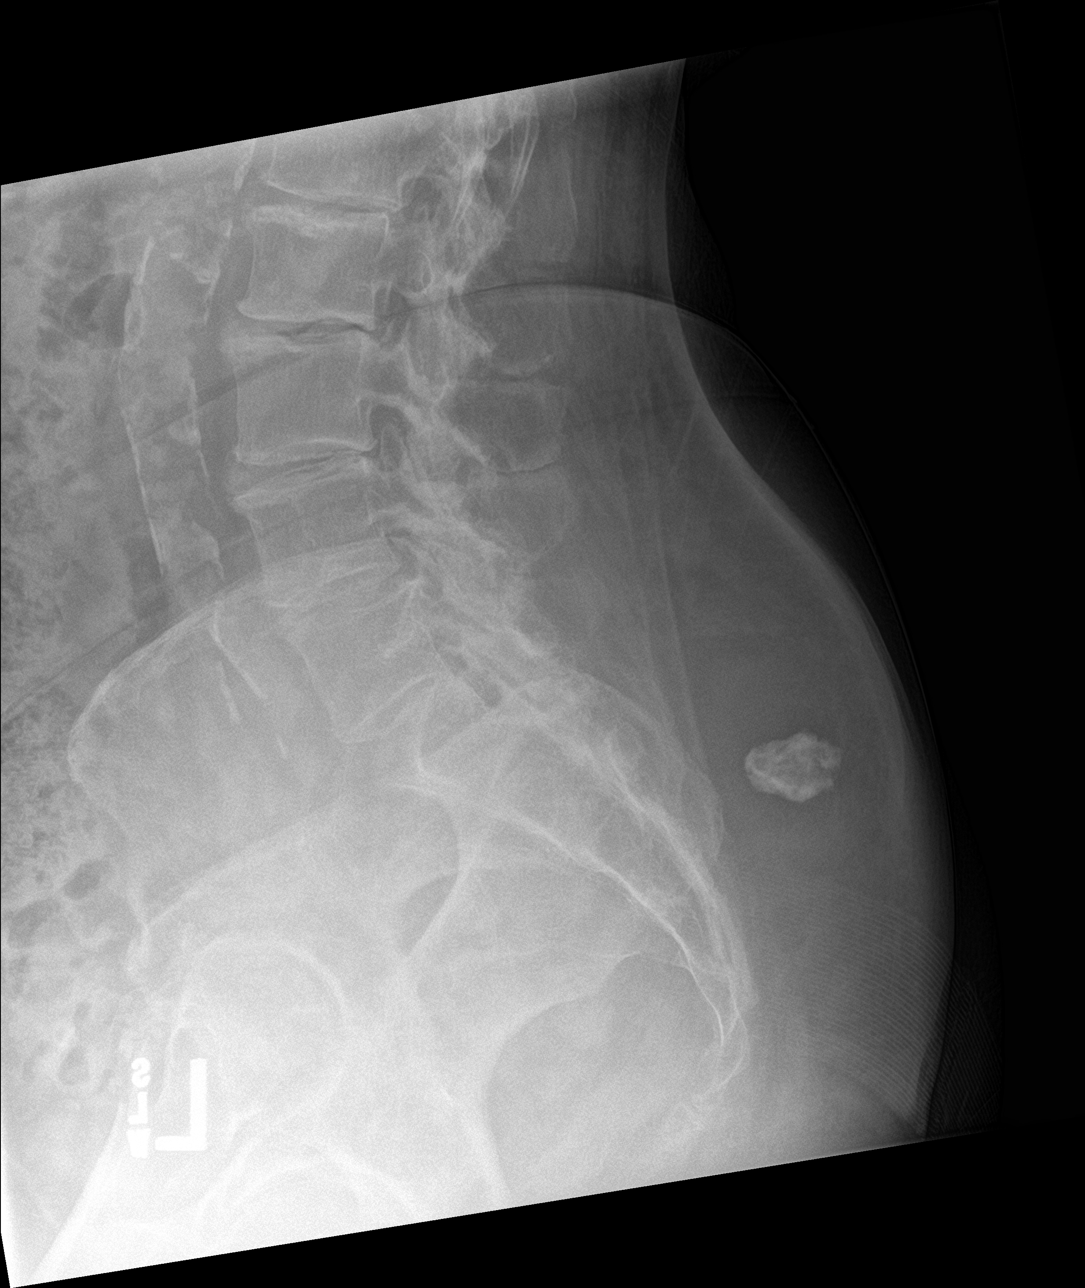

[5 of 5 positions shown; findings below may reference images not displayed]

FINDINGS: The lumbar vertebral bodies are preserved in height. The disc space
heights are decreased at all lumbar levels. There is no
spondylolisthesis. There is facet joint hypertrophy at L4-5 and at
L5-S1. The pedicles and transverse processes are intact. The
observed portions of the sacrum are normal. There is calcification
in the wall of the abdominal aorta and common iliac vessels. There
is soft tissue calcification in the right gluteal region which is
chronic.
IMPRESSION: Multilevel degenerative disc disease which has progressed since the
previous study. No compression fracture or spondylolisthesis. There
is facet joint hypertrophy in the lower lumbar spine.

Abdominal aortic atherosclerosis.

## 2019-04-24 ENCOUNTER — Telehealth: Payer: Self-pay

## 2019-04-24 NOTE — Telephone Encounter (Signed)
Lets call Neuro and see if we can get her something sooner.  It is not urgent.  She does not need to be seen within the next 10 business days but if she could be seen even at the end of this month it would be better.

## 2019-04-24 NOTE — Telephone Encounter (Signed)
Mindy called concerning MRI results. States that patient has an appointment with neuro in October- do you feel this needs to be moved sooner?

## 2019-04-25 DIAGNOSIS — J441 Chronic obstructive pulmonary disease with (acute) exacerbation: Secondary | ICD-10-CM | POA: Diagnosis not present

## 2019-04-25 NOTE — Telephone Encounter (Signed)
Spoke to patients daughter. Patient is seen at [facility]. Had first visit last week.   I called [facility] and they states there are no sooner appointments in [place] because he is only there 2 days out of the month. Patient is currently scheduled for October 8th at 11:40 in [place]. I called and advised [name], also let her know patient can be seen sooner in [facility] if they are OK with that, she needs to call [phone].

## 2019-04-25 NOTE — Telephone Encounter (Signed)
Called [name] and asked her to call back and confirm who patient's neurologist and if she has seen neuro yet or not

## 2019-04-30 ENCOUNTER — Other Ambulatory Visit: Payer: Self-pay | Admitting: Family Medicine

## 2019-05-01 DIAGNOSIS — G2581 Restless legs syndrome: Secondary | ICD-10-CM | POA: Diagnosis not present

## 2019-05-01 DIAGNOSIS — M5417 Radiculopathy, lumbosacral region: Secondary | ICD-10-CM | POA: Diagnosis not present

## 2019-05-01 DIAGNOSIS — G500 Trigeminal neuralgia: Secondary | ICD-10-CM | POA: Diagnosis not present

## 2019-05-01 DIAGNOSIS — Z79899 Other long term (current) drug therapy: Secondary | ICD-10-CM | POA: Diagnosis not present

## 2019-05-03 ENCOUNTER — Other Ambulatory Visit: Payer: Self-pay | Admitting: Family Medicine

## 2019-05-10 ENCOUNTER — Other Ambulatory Visit: Payer: Self-pay | Admitting: *Deleted

## 2019-05-19 ENCOUNTER — Other Ambulatory Visit: Payer: Self-pay | Admitting: Family Medicine

## 2019-05-19 DIAGNOSIS — F339 Major depressive disorder, recurrent, unspecified: Secondary | ICD-10-CM

## 2019-05-25 DIAGNOSIS — J441 Chronic obstructive pulmonary disease with (acute) exacerbation: Secondary | ICD-10-CM | POA: Diagnosis not present

## 2019-05-30 DIAGNOSIS — Z79899 Other long term (current) drug therapy: Secondary | ICD-10-CM | POA: Diagnosis not present

## 2019-05-30 DIAGNOSIS — G2581 Restless legs syndrome: Secondary | ICD-10-CM | POA: Diagnosis not present

## 2019-05-30 DIAGNOSIS — M545 Low back pain: Secondary | ICD-10-CM | POA: Diagnosis not present

## 2019-05-30 DIAGNOSIS — G500 Trigeminal neuralgia: Secondary | ICD-10-CM | POA: Diagnosis not present

## 2019-05-30 DIAGNOSIS — G603 Idiopathic progressive neuropathy: Secondary | ICD-10-CM | POA: Diagnosis not present

## 2019-06-24 ENCOUNTER — Ambulatory Visit (INDEPENDENT_AMBULATORY_CARE_PROVIDER_SITE_OTHER): Payer: Medicare HMO | Admitting: Family Medicine

## 2019-06-24 ENCOUNTER — Encounter: Payer: Self-pay | Admitting: Family Medicine

## 2019-06-24 VITALS — BP 134/81 | Ht 65.0 in

## 2019-06-24 DIAGNOSIS — R110 Nausea: Secondary | ICD-10-CM

## 2019-06-24 DIAGNOSIS — R8290 Unspecified abnormal findings in urine: Secondary | ICD-10-CM

## 2019-06-24 DIAGNOSIS — R490 Dysphonia: Secondary | ICD-10-CM

## 2019-06-24 DIAGNOSIS — I10 Essential (primary) hypertension: Secondary | ICD-10-CM

## 2019-06-24 DIAGNOSIS — R748 Abnormal levels of other serum enzymes: Secondary | ICD-10-CM | POA: Diagnosis not present

## 2019-06-24 DIAGNOSIS — R3911 Hesitancy of micturition: Secondary | ICD-10-CM

## 2019-06-24 DIAGNOSIS — R5383 Other fatigue: Secondary | ICD-10-CM

## 2019-06-24 DIAGNOSIS — M542 Cervicalgia: Secondary | ICD-10-CM

## 2019-06-24 NOTE — Progress Notes (Signed)
Called pt and there was no answer or vm.Nancy Cisneros, [place]

## 2019-06-24 NOTE — Progress Notes (Signed)
Virtual Visit via Telephone Note  I connected with Nancy Cisneros on 06/24/19 at  2:00 PM EST by telephone and verified that I am speaking with the correct person using two identifiers.   I discussed the limitations, risks, security and privacy concerns of performing an evaluation and management service by telephone and the availability of in person appointments. I also discussed with the patient that there may be a patient responsible charge related to this service. The patient expressed understanding and agreed to proceed.   Established Patient Office Visit  Subjective:  Patient ID: Nancy Cisneros, female    DOB: 04-01-1941  Age: 78 y.o. MRN: [id number]  CC:  Chief Complaint  Patient presents with  . Fatigue  . Laryngitis    HPI Nancy Cisneros presents for 1 month of nausea and cold sweats.  She says it does not seem to be triggered by eating or not eating.  History of cholecystectomy.  She says she just feels weak as well.  She feels like she just has to push herself to get through the day.  She denies any vomiting or diarrhea.  She denies any fever.  She has also noticed that her blood pressures have been running a little bit higher in fact yesterday it was 162/95 but today it was normal.  She has not lost any weight.  She also reports that her voice has been going in and out.  This is actually been going on for months but seems to be getting a little bit worse.  She is not having any problems swallowing.  She also reports some urinary odor and difficulty urinating for about 1 week.  She denies any problems swallowing or ear pain.  She denies any rashes.  She denies any blood in the urine or stool.  She denies any localized pain in her abdomen chest or joints.  She has been taking her Nexium daily.  While on the phone I had her daughter feel her neck and she does report that she feels like she is feeling some swollen lymph nodes on the left side of her neck.  She actually was evaluated by ENT in  October of last year so approximately 1 year ago.  At the time she was having pain from trigeminal neuralgia and was also having some dysphonia which they felt was related to smoking and reflux.  They actually increased her reflux medicine to twice a day at that time.  I did do a flexible laryngoscopy in the office.  Past Medical History:  Diagnosis Date  . Adrenal adenoma    1 cm Left  . CAD (coronary artery disease)    Dr Prince Rome, [facility] cardiology  . COPD (chronic obstructive pulmonary disease) (HCC)    lung nodule- Dr Tilden Dome  . Diverticulosis of colon   . Hip pain, left   . Hyperlipidemia   . Hypertension     Past Surgical History:  Procedure Laterality Date  . Lynx mid urethral sling     Dr Beverley Fiedler    Family History  Problem Relation Age of Onset  . Hyperlipidemia Mother   . Hypertension Mother   . Cancer Father        Oat cell lung CA  . Alcohol abuse Father     Social History   Socioeconomic History  . Marital status: Married    Spouse name: Not on file  . Number of children: Not on file  . Years of education: Not on  file  . Highest education level: Not on file  Occupational History  . Not on file  Social Needs  . Financial resource strain: Not on file  . Food insecurity    Worry: Not on file    Inability: Not on file  . Transportation needs    Medical: Not on file    Non-medical: Not on file  Tobacco Use  . Smoking status: Former Smoker    Packs/day: 0.40    Quit date: 03/28/2016    Years since quitting: 3.2  . Smokeless tobacco: Never Used  Substance and Sexual Activity  . Alcohol use: No  . Drug use: Not on file  . Sexual activity: Yes  Lifestyle  . Physical activity    Days per week: Not on file    Minutes per session: Not on file  . Stress: Not on file  Relationships  . Social Herbalist on phone: Not on file    Gets together: Not on file    Attends religious service: Not on file    Active member of club or organization:  Not on file    Attends meetings of clubs or organizations: Not on file    Relationship status: Not on file  . Intimate partner violence    Fear of current or ex partner: Not on file    Emotionally abused: Not on file    Physically abused: Not on file    Forced sexual activity: Not on file  Other Topics Concern  . Not on file  Social History Narrative  . Not on file    Outpatient Medications Prior to Visit  Medication Sig Dispense Refill  . albuterol (PROAIR HFA) 108 (90 Base) MCG/ACT inhaler Inhale 2-4 puffs into the lungs every 6 (six) hours as needed. 1 Inhaler 3  . albuterol (PROVENTIL) (2.5 MG/3ML) 0.083% nebulizer solution Take 3 mLs (2.5 mg total) by nebulization every 6 (six) hours. And PRN 450 mL 1  . AMBULATORY NON FORMULARY MEDICATION Nebulizer 1 each 0  . AMBULATORY NON FORMULARY MEDICATION Medication Name: Home sleep study through [facility]. Dx snoring and OSA 1 vial 0  . amLODipine (NORVASC) 5 MG tablet TAKE 1 TABLET EVERY DAY 90 tablet 1  . aspirin 81 MG tablet Take 81 mg by mouth daily.      Marland Kitchen buPROPion (WELLBUTRIN XL) 150 MG 24 hr tablet TAKE 1 TABLET BY MOUTH EVERY DAY IN THE MORNING 90 tablet 1  . carbamazepine (TEGRETOL XR) 100 MG 12 hr tablet TAKE 1 TABLET BY MOUTH TWICE A DAY 60 tablet 0  . cyanocobalamin (,VITAMIN B-12,) 1000 MCG/ML injection INJECT 1 ML SUBQUTANOUSLY EVERY 7 DAYS 30 mL 1  . esomeprazole (NEXIUM) 40 MG capsule TAKE 1 CAPSULE (40 MG TOTAL) BY MOUTH DAILY. 90 capsule 3  . Fluticasone-Umeclidin-Vilant (TRELEGY ELLIPTA) 100-62.5-25 MCG/INH AEPB Inhale 1 Inhaler into the lungs daily. 60 each PRN  . gabapentin (NEURONTIN) 300 MG capsule Take 300 mg by mouth at bedtime. Take 3 capsules (900 mg) by mouth at bedtime    . HYDROcodone-acetaminophen (NORCO) 10-325 MG tablet     . ipratropium (ATROVENT) 0.02 % nebulizer solution TAKE 2.5 MLS (0.5 MG TOTAL) BY NEBULIZATION 4 (FOUR) TIMES DAILY. 312.5 mL 0  . ketorolac (ACULAR) 0.5 % ophthalmic solution     .  losartan (COZAAR) 25 MG tablet TAKE 1 TABLET EVERY DAY 90 tablet 1  . meloxicam (MOBIC) 15 MG tablet Take 15 mg by mouth every morning.  3  .  methocarbamol (ROBAXIN) 750 MG tablet Take 750 mg by mouth daily.    . metoprolol tartrate (LOPRESSOR) 100 MG tablet TAKE 1 TABLET TWICE DAILY 180 tablet 1  . prednisoLONE acetate (PRED FORTE) 1 % ophthalmic suspension     . rOPINIRole (REQUIP) 0.5 MG tablet     . sertraline (ZOLOFT) 100 MG tablet TAKE 1 AND 1/2 TABLETS EVERY DAY 135 tablet 1  . sucralfate (CARAFATE) 1 g tablet TAKE 1 TABLET (1 G TOTAL) BY MOUTH 4 (FOUR) TIMES DAILY - WITH MEALS AND AT BEDTIME. 120 tablet 1  . valACYclovir (VALTREX) 1000 MG tablet Take 1 g by mouth 3 (three) times daily.  0   No facility-administered medications prior to visit.     Allergies  Allergen Reactions  . Methylprednisolone Sodium Succ Hives  . Codeine     REACTION: trouble breathing  . Ramipril     REACTION: cough  . Solu-Medrol [Methylprednisolone Acetate] Other (See Comments)    nausea and hives    ROS Review of Systems    Objective:    Physical Exam  Constitutional: She is oriented to person, place, and time.  Pulmonary/Chest: Effort normal.  Neurological: She is alert and oriented to person, place, and time.  Skin:  Voice sounds very weak.   Psychiatric: She has a normal mood and affect. Her behavior is normal.    BP 134/81   Ht 5\' 5"  (1.651 m)   BMI 29.45 kg/m  Wt Readings from Last 3 Encounters:  04/10/19 177 lb (80.3 kg)  11/01/18 182 lb (82.6 kg)  10/24/18 178 lb 3.2 oz (80.8 kg)     There are no preventive care reminders to display for this patient.  There are no preventive care reminders to display for this patient.  Lab Results  Component Value Date   TSH 0.79 06/05/2018   Lab Results  Component Value Date   WBC 10.0 11/01/2018   HGB 12.3 11/01/2018   HCT 36.0 11/01/2018   MCV 89.1 11/01/2018   PLT 243 11/01/2018   Lab Results  Component Value Date   NA  137 04/10/2019   K 4.9 04/10/2019   CO2 26 04/10/2019   GLUCOSE 100 (H) 04/10/2019   BUN 15 04/10/2019   CREATININE 0.96 (H) 04/10/2019   BILITOT 0.5 04/10/2019   ALKPHOS 67 08/29/2016   AST 15 04/10/2019   ALT 10 04/10/2019   PROT 6.3 04/10/2019   ALBUMIN 3.9 08/29/2016   CALCIUM 9.8 04/10/2019   Lab Results  Component Value Date   CHOL 215 (H) 06/05/2018   Lab Results  Component Value Date   HDL 44 (L) 06/05/2018   Lab Results  Component Value Date   LDLCALC 130 (H) 06/05/2018   Lab Results  Component Value Date   TRIG 266 (H) 06/05/2018   Lab Results  Component Value Date   CHOLHDL 4.9 06/05/2018   No results found for: HGBA1C    Assessment & Plan:   Problem List Items Addressed This Visit    None    Visit Diagnoses    Voice hoarseness    -  Primary   Relevant Orders   CBC with Differential/Platelet   Sedimentation rate   COMPLETE METABOLIC PANEL WITH GFR   TSH   Urinalysis, Routine w reflex microscopic   CT Soft Tissue Neck W Contrast   Fatigue, unspecified type       Relevant Orders   CBC with Differential/Platelet   Sedimentation rate   COMPLETE METABOLIC PANEL  WITH GFR   TSH   Urinalysis, Routine w reflex microscopic   CT Soft Tissue Neck W Contrast   Abnormal urine odor       Relevant Orders   CBC with Differential/Platelet   Sedimentation rate   COMPLETE METABOLIC PANEL WITH GFR   TSH   Urinalysis, Routine w reflex microscopic   Urinary hesitancy       Relevant Orders   CBC with Differential/Platelet   Sedimentation rate   COMPLETE METABOLIC PANEL WITH GFR   TSH   Urinalysis, Routine w reflex microscopic   Uncontrolled hypertension       Relevant Orders   CBC with Differential/Platelet   Sedimentation rate   COMPLETE METABOLIC PANEL WITH GFR   TSH   Urinalysis, Routine w reflex microscopic   Nausea       Relevant Orders   CBC with Differential/Platelet   Sedimentation rate   COMPLETE METABOLIC PANEL WITH GFR   TSH    Urinalysis, Routine w reflex microscopic   Dysphonia       Relevant Orders   CT Soft Tissue Neck W Contrast   Tenderness of neck       Relevant Orders   CT Soft Tissue Neck W Contrast      Voice hoarseness/dysphonia-already had laryngoscopy a year ago.  She continues to take her reflux medicine regularly.  Discussed neck steps will be CT of the neck especially since she is now having some tenderness and her daughter feels like she might have some swollen lymph nodes present.  Nausea-unclear etiology.  No vomiting.  No new medications.  She has had a cholecystectomy.  Urine with odor-we will check urinalysis will call with results once available.  Fatigue - will check sed rate and CBC.    Tenderness of the neck-did the best we could over the telephone visit today prefer that she come in in person but will start with lab work and CT and work-up further from there.  Because of her smoking history I am worried about the possibility of cancer.  No orders of the defined types were placed in this encounter.   Follow-up: Return in about 4 weeks (around 07/22/2019).    I discussed the assessment and treatment plan with the patient. The patient was provided an opportunity to ask questions and all were answered. The patient agreed with the plan and demonstrated an understanding of the instructions.   The patient was advised to call back or seek an in-person evaluation if the symptoms worsen or if the condition fails to improve as anticipated.  I provided 25 minutes of non-face-to-face time during this encounter.   Beatrice Lecher, MD

## 2019-06-25 DIAGNOSIS — J441 Chronic obstructive pulmonary disease with (acute) exacerbation: Secondary | ICD-10-CM | POA: Diagnosis not present

## 2019-06-25 LAB — URINALYSIS, ROUTINE W REFLEX MICROSCOPIC
Bilirubin Urine: NEGATIVE
Glucose, UA: NEGATIVE
Hgb urine dipstick: NEGATIVE
Hyaline Cast: NONE SEEN /LPF
Ketones, ur: NEGATIVE
Nitrite: POSITIVE — AB
Protein, ur: NEGATIVE
Specific Gravity, Urine: 1.016 (ref 1.001–1.03)
WBC, UA: >=60 /HPF — AB (ref 0–5)
pH: 6 (ref 5.0–8.0)

## 2019-06-25 LAB — COMPLETE METABOLIC PANEL WITH GFR
AG Ratio: 1.6 (calc) (ref 1.0–2.5)
ALT: 107 U/L — ABNORMAL HIGH (ref 6–29)
AST: 164 U/L — ABNORMAL HIGH (ref 10–35)
Albumin: 4 g/dL (ref 3.6–5.1)
Alkaline phosphatase (APISO): 120 U/L (ref 37–153)
BUN/Creatinine Ratio: 14 (calc) (ref 6–22)
BUN: 16 mg/dL (ref 7–25)
CO2: 27 mmol/L (ref 20–32)
Calcium: 9.8 mg/dL (ref 8.6–10.4)
Chloride: 99 mmol/L (ref 98–110)
Creat: 1.12 mg/dL — ABNORMAL HIGH (ref 0.60–0.88)
GFR, Est African American: 54 mL/min/{1.73_m2} — ABNORMAL LOW (ref >=60)
GFR, Est Non African American: 46 mL/min/{1.73_m2} — ABNORMAL LOW (ref >=60)
Globulin: 2.5 g/dL (calc) (ref 1.9–3.7)
Glucose, Bld: 90 mg/dL (ref 65–99)
Potassium: 5.4 mmol/L — ABNORMAL HIGH (ref 3.5–5.3)
Sodium: 135 mmol/L (ref 135–146)
Total Bilirubin: 0.8 mg/dL (ref 0.2–1.2)
Total Protein: 6.5 g/dL (ref 6.1–8.1)

## 2019-06-25 LAB — CBC WITH DIFFERENTIAL/PLATELET
Absolute Monocytes: 600 cells/uL (ref 200–950)
Basophils Absolute: 23 cells/uL (ref 0–200)
Basophils Relative: 0.3 %
Eosinophils Absolute: 106 cells/uL (ref 15–500)
Eosinophils Relative: 1.4 %
HCT: 41.4 % (ref 35.0–45.0)
Hemoglobin: 14.3 g/dL (ref 11.7–15.5)
Lymphs Abs: 2288 cells/uL (ref 850–3900)
MCH: 31.8 pg (ref 27.0–33.0)
MCHC: 34.5 g/dL (ref 32.0–36.0)
MCV: 92.2 fL (ref 80.0–100.0)
MPV: 11.3 fL (ref 7.5–12.5)
Monocytes Relative: 7.9 %
Neutro Abs: 4583 cells/uL (ref 1500–7800)
Neutrophils Relative %: 60.3 %
Platelets: 248 10*3/uL (ref 140–400)
RBC: 4.49 10*6/uL (ref 3.80–5.10)
RDW: 14.5 % (ref 11.0–15.0)
Total Lymphocyte: 30.1 %
WBC: 7.6 10*3/uL (ref 3.8–10.8)

## 2019-06-25 LAB — TSH: TSH: 2.18 mIU/L (ref 0.40–4.50)

## 2019-06-25 LAB — SEDIMENTATION RATE: Sed Rate: 9 mm/h (ref 0–30)

## 2019-06-25 MED ORDER — SULFAMETHOXAZOLE-TRIMETHOPRIM 800-160 MG PO TABS: 1.0000 | ORAL_TABLET | Freq: Two times a day (BID) | ORAL | 0 refills | Status: DC

## 2019-06-25 NOTE — Addendum Note (Signed)
Addended by: Beatrice Lecher D on: 06/25/2019 07:53 AM   Modules accepted: Orders

## 2019-06-26 ENCOUNTER — Other Ambulatory Visit: Payer: Self-pay

## 2019-06-26 ENCOUNTER — Other Ambulatory Visit: Payer: Self-pay | Admitting: *Deleted

## 2019-06-26 ENCOUNTER — Ambulatory Visit (INDEPENDENT_AMBULATORY_CARE_PROVIDER_SITE_OTHER): Payer: Medicare HMO

## 2019-06-26 DIAGNOSIS — K7689 Other specified diseases of liver: Secondary | ICD-10-CM | POA: Diagnosis not present

## 2019-06-26 DIAGNOSIS — R5383 Other fatigue: Secondary | ICD-10-CM | POA: Diagnosis not present

## 2019-06-26 DIAGNOSIS — R110 Nausea: Secondary | ICD-10-CM | POA: Diagnosis not present

## 2019-06-26 DIAGNOSIS — M542 Cervicalgia: Secondary | ICD-10-CM | POA: Diagnosis not present

## 2019-06-26 DIAGNOSIS — R748 Abnormal levels of other serum enzymes: Secondary | ICD-10-CM

## 2019-06-26 DIAGNOSIS — R490 Dysphonia: Secondary | ICD-10-CM

## 2019-06-26 MED ORDER — IOHEXOL 300 MG/ML  SOLN
100.0000 mL | Freq: Once | INTRAMUSCULAR | Status: AC | PRN
  Administered 2019-06-26: 80 mL via INTRAVENOUS

## 2019-06-27 ENCOUNTER — Encounter: Payer: Self-pay | Admitting: Family Medicine

## 2019-06-27 ENCOUNTER — Other Ambulatory Visit: Payer: Self-pay

## 2019-06-27 DIAGNOSIS — R748 Abnormal levels of other serum enzymes: Secondary | ICD-10-CM

## 2019-06-27 NOTE — Progress Notes (Signed)
Orders to re-check liver enzymes Monday 07/01/19   Any additional labs to be added?

## 2019-06-29 ENCOUNTER — Encounter: Payer: Self-pay | Admitting: Family Medicine

## 2019-07-02 DIAGNOSIS — R748 Abnormal levels of other serum enzymes: Secondary | ICD-10-CM | POA: Diagnosis not present

## 2019-07-02 LAB — HEPATIC FUNCTION PANEL
AG Ratio: 1.8 (calc) (ref 1.0–2.5)
ALT: 13 U/L (ref 6–29)
AST: 12 U/L (ref 10–35)
Albumin: 4 g/dL (ref 3.6–5.1)
Alkaline phosphatase (APISO): 81 U/L (ref 37–153)
Bilirubin, Direct: 0.1 mg/dL (ref 0.0–0.2)
Globulin: 2.2 g/dL (calc) (ref 1.9–3.7)
Indirect Bilirubin: 0.3 mg/dL (calc) (ref 0.2–1.2)
Total Bilirubin: 0.4 mg/dL (ref 0.2–1.2)
Total Protein: 6.2 g/dL (ref 6.1–8.1)

## 2019-07-10 ENCOUNTER — Telehealth: Payer: Self-pay

## 2019-07-10 NOTE — Telephone Encounter (Signed)
Nancy Cisneros called this morning stating she is not feeling better. She is having left side pain. She has been scheduled for an in office visit tomorrow morning. She would not see another provider. She was advised to go to the urgent care if symptoms worsen.

## 2019-07-11 ENCOUNTER — Ambulatory Visit (INDEPENDENT_AMBULATORY_CARE_PROVIDER_SITE_OTHER): Payer: Medicare HMO | Admitting: Family Medicine

## 2019-07-11 ENCOUNTER — Other Ambulatory Visit: Payer: Self-pay

## 2019-07-11 ENCOUNTER — Encounter: Payer: Self-pay | Admitting: Family Medicine

## 2019-07-11 ENCOUNTER — Ambulatory Visit (INDEPENDENT_AMBULATORY_CARE_PROVIDER_SITE_OTHER): Payer: Medicare HMO

## 2019-07-11 VITALS — BP 132/70 | HR 69 | Temp 98.4°F | Ht 65.0 in | Wt 183.0 lb

## 2019-07-11 DIAGNOSIS — M545 Low back pain, unspecified: Secondary | ICD-10-CM

## 2019-07-11 DIAGNOSIS — L723 Sebaceous cyst: Secondary | ICD-10-CM | POA: Diagnosis not present

## 2019-07-11 DIAGNOSIS — N3000 Acute cystitis without hematuria: Secondary | ICD-10-CM | POA: Diagnosis not present

## 2019-07-11 DIAGNOSIS — R300 Dysuria: Secondary | ICD-10-CM | POA: Diagnosis not present

## 2019-07-11 LAB — POCT URINALYSIS DIP (CLINITEK)
Bilirubin, UA: NEGATIVE
Blood, UA: NEGATIVE
Glucose, UA: NEGATIVE mg/dL
Ketones, POC UA: NEGATIVE mg/dL
Nitrite, UA: POSITIVE — AB
POC PROTEIN,UA: NEGATIVE
Spec Grav, UA: 1.02 (ref 1.010–1.025)
Urobilinogen, UA: 0.2 E.U./dL
pH, UA: 6 (ref 5.0–8.0)

## 2019-07-11 MED ORDER — NITROFURANTOIN MONOHYD MACRO 100 MG PO CAPS: 100.0000 mg | ORAL_CAPSULE | Freq: Two times a day (BID) | ORAL | 0 refills | Status: DC

## 2019-07-11 MED ORDER — PREDNISONE 20 MG PO TABS: 40.0000 mg | ORAL_TABLET | Freq: Every day | ORAL | 0 refills | Status: DC

## 2019-07-11 MED ORDER — KETOROLAC TROMETHAMINE 60 MG/2ML IM SOLN
60.0000 mg | Freq: Once | INTRAMUSCULAR | Status: AC
  Administered 2019-07-11: 60 mg via INTRAMUSCULAR

## 2019-07-11 NOTE — Progress Notes (Signed)
Acute Office Visit  Subjective:    Patient ID: Nancy Cisneros, female    DOB: [birth year], 78 y.o.   MRN: [id number]  Chief Complaint  Patient presents with  . dysuria    HPI Patient is in today for left sided pain.  She says it starts at her navel and radiates around into her back on the left side.  She is also complaining of urinary frequency and pain with urination that is been going on for couple of weeks.  In fact we did some labs on her on November 2 and it did look like her urinalysis was positive for UTI.  Treated her with Bactrim.  But she says she just really never got better.she just does not feel well she feels tired and achy.  She has been having a lot of side pain just above her hip radiating towards her low back.  She says it really started before she started having the urinary symptoms a few weeks ago.  She does not remember any specific injury or trauma.  Been having a little bit of pain going down the posterior thigh but otherwise no significant radicular symptoms.  He says standing and prolonged standing really aggravate it.  If she sits down or lays down it actually feels better.  He also has a few "bumps" on the back of her neck she would like me to look at today.  They have been there for a long time and occasionally her daughter will squeeze them and get some smelly discharge from them.  She would like to get rid of them permanently.  Past Medical History:  Diagnosis Date  . Adrenal adenoma    1 cm Left  . CAD (coronary artery disease)    Dr Prince Rome, [facility] cardiology  . COPD (chronic obstructive pulmonary disease) (HCC)    lung nodule- Dr Tilden Dome  . Diverticulosis of colon   . Hip pain, left   . Hyperlipidemia   . Hypertension     Past Surgical History:  Procedure Laterality Date  . Lynx mid urethral sling     Dr Beverley Fiedler    Family History  Problem Relation Age of Onset  . Hyperlipidemia Mother   . Hypertension Mother   . Cancer Father        Oat  cell lung CA  . Alcohol abuse Father     Social History   Socioeconomic History  . Marital status: Married    Spouse name: Not on file  . Number of children: Not on file  . Years of education: Not on file  . Highest education level: Not on file  Occupational History  . Not on file  Social Needs  . Financial resource strain: Not on file  . Food insecurity    Worry: Not on file    Inability: Not on file  . Transportation needs    Medical: Not on file    Non-medical: Not on file  Tobacco Use  . Smoking status: Former Smoker    Packs/day: 0.40    Quit date: 03/28/2016    Years since quitting: 3.2  . Smokeless tobacco: Never Used  Substance and Sexual Activity  . Alcohol use: No  . Drug use: Not on file  . Sexual activity: Yes  Lifestyle  . Physical activity    Days per week: Not on file    Minutes per session: Not on file  . Stress: Not on file  Relationships  . Social connections  Talks on phone: Not on file    Gets together: Not on file    Attends religious service: Not on file    Active member of club or organization: Not on file    Attends meetings of clubs or organizations: Not on file    Relationship status: Not on file  . Intimate partner violence    Fear of current or ex partner: Not on file    Emotionally abused: Not on file    Physically abused: Not on file    Forced sexual activity: Not on file  Other Topics Concern  . Not on file  Social History Narrative  . Not on file    Outpatient Medications Prior to Visit  Medication Sig Dispense Refill  . albuterol (PROAIR HFA) 108 (90 Base) MCG/ACT inhaler Inhale 2-4 puffs into the lungs every 6 (six) hours as needed. 1 Inhaler 3  . albuterol (PROVENTIL) (2.5 MG/3ML) 0.083% nebulizer solution Take 3 mLs (2.5 mg total) by nebulization every 6 (six) hours. And PRN 450 mL 1  . AMBULATORY NON FORMULARY MEDICATION Nebulizer 1 each 0  . AMBULATORY NON FORMULARY MEDICATION Medication Name: Home sleep study through  [facility] [place]. Dx snoring and OSA 1 vial 0  . amLODipine (NORVASC) 5 MG tablet TAKE 1 TABLET EVERY DAY 90 tablet 1  . aspirin 81 MG tablet Take 81 mg by mouth daily.      Marland Kitchen buPROPion (WELLBUTRIN XL) 150 MG 24 hr tablet TAKE 1 TABLET BY MOUTH EVERY DAY IN THE MORNING 90 tablet 1  . carbamazepine (TEGRETOL XR) 100 MG 12 hr tablet TAKE 1 TABLET BY MOUTH TWICE A DAY 60 tablet 0  . cyanocobalamin (,VITAMIN B-12,) 1000 MCG/ML injection INJECT 1 ML SUBQUTANOUSLY EVERY 7 DAYS 30 mL 1  . DULoxetine (CYMBALTA) 60 MG capsule     . esomeprazole (NEXIUM) 40 MG capsule TAKE 1 CAPSULE (40 MG TOTAL) BY MOUTH DAILY. 90 capsule 3  . Fluticasone-Umeclidin-Vilant (TRELEGY ELLIPTA) 100-62.5-25 MCG/INH AEPB Inhale 1 Inhaler into the lungs daily. 60 each PRN  . gabapentin (NEURONTIN) 300 MG capsule Take 300 mg by mouth at bedtime. Take 3 capsules (900 mg) by mouth at bedtime    . HYDROcodone-acetaminophen (NORCO) 10-325 MG tablet     . ipratropium (ATROVENT) 0.02 % nebulizer solution TAKE 2.5 MLS (0.5 MG TOTAL) BY NEBULIZATION 4 (FOUR) TIMES DAILY. 312.5 mL 0  . losartan (COZAAR) 25 MG tablet TAKE 1 TABLET EVERY DAY 90 tablet 1  . meloxicam (MOBIC) 15 MG tablet Take 15 mg by mouth every morning.  3  . methocarbamol (ROBAXIN) 750 MG tablet Take 750 mg by mouth daily.    . metoprolol tartrate (LOPRESSOR) 100 MG tablet TAKE 1 TABLET TWICE DAILY 180 tablet 1  . rOPINIRole (REQUIP) 0.5 MG tablet     . sertraline (ZOLOFT) 100 MG tablet TAKE 1 AND 1/2 TABLETS EVERY DAY 135 tablet 1  . sucralfate (CARAFATE) 1 g tablet TAKE 1 TABLET (1 G TOTAL) BY MOUTH 4 (FOUR) TIMES DAILY - WITH MEALS AND AT BEDTIME. 120 tablet 1  . valACYclovir (VALTREX) 1000 MG tablet Take 1 g by mouth 3 (three) times daily.  0  . ketorolac (ACULAR) 0.5 % ophthalmic solution     . prednisoLONE acetate (PRED FORTE) 1 % ophthalmic suspension     . sulfamethoxazole-trimethoprim (BACTRIM DS) 800-160 MG tablet Take 1 tablet by mouth 2 (two) times daily. 6 tablet 0    No facility-administered medications prior to visit.  Allergies  Allergen Reactions  . Methylprednisolone Sodium Succ Hives  . Codeine     REACTION: trouble breathing  . Ramipril     REACTION: cough  . Solu-Medrol [Methylprednisolone Sodium Succ] Other (See Comments)    nausea and hives    ROS     Objective:    Physical Exam  Constitutional: She is oriented to person, place, and time. She appears well-developed and well-nourished.  HENT:  Head: Normocephalic and atraumatic.  Eyes: Conjunctivae and EOM are normal.  Cardiovascular: Normal rate.  Pulmonary/Chest: Effort normal.  Musculoskeletal:     Comments: Tender over the lumbar spine and just above the right hip and over the iliac crest.  No CVA tenderness.  Neurological: She is alert and oriented to person, place, and time.  Skin: Skin is dry. No pallor.  Her posterior neck she has about 4 small sebaceous cyst each about 1 to 1-1/2 cm in size.  None of them look actively inflamed.  Active drainage.  Psychiatric: She has a normal mood and affect. Her behavior is normal.  Vitals reviewed.   BP 132/70   Pulse 69   Temp 98.4 F (36.9 C)   Ht 5\' 5"  (1.651 m)   Wt 183 lb (83 kg)   SpO2 96%   BMI 30.45 kg/m  Wt Readings from Last 3 Encounters:  07/11/19 183 lb (83 kg)  04/10/19 177 lb (80.3 kg)  11/01/18 182 lb (82.6 kg)    There are no preventive care reminders to display for this patient.  There are no preventive care reminders to display for this patient.   Lab Results  Component Value Date   TSH 2.18 06/24/2019   Lab Results  Component Value Date   WBC 7.6 06/24/2019   HGB 14.3 06/24/2019   HCT 41.4 06/24/2019   MCV 92.2 06/24/2019   PLT 248 06/24/2019   Lab Results  Component Value Date   NA 135 06/24/2019   K 5.4 (H) 06/24/2019   CO2 27 06/24/2019   GLUCOSE 90 06/24/2019   BUN 16 06/24/2019   CREATININE 1.12 (H) 06/24/2019   BILITOT 0.4 07/02/2019   ALKPHOS 67 08/29/2016   AST 12  07/02/2019   ALT 13 07/02/2019   PROT 6.2 07/02/2019   ALBUMIN 3.9 08/29/2016   CALCIUM 9.8 06/24/2019   Lab Results  Component Value Date   CHOL 215 (H) 06/05/2018   Lab Results  Component Value Date   HDL 44 (L) 06/05/2018   Lab Results  Component Value Date   LDLCALC 130 (H) 06/05/2018   Lab Results  Component Value Date   TRIG 266 (H) 06/05/2018   Lab Results  Component Value Date   CHOLHDL 4.9 06/05/2018   No results found for: HGBA1C     Assessment & Plan:   Problem List Items Addressed This Visit    None    Visit Diagnoses    Dysuria    -  Primary   Relevant Orders   POCT URINALYSIS DIP (CLINITEK) (Completed)   Urine Culture   Acute left-sided low back pain without sciatica       Relevant Medications   predniSONE (DELTASONE) 20 MG tablet   ketorolac (TORADOL) injection 60 mg (Completed) (Start on 07/11/2019 12:15 PM)   Other Relevant Orders   DG Lumbar Spine Complete   Acute cystitis without hematuria       Relevant Medications   nitrofurantoin, macrocrystal-monohydrate, (MACROBID) 100 MG capsule   ketorolac (TORADOL) injection 60 mg (Completed) (  Start on 07/11/2019 12:15 PM)   Sebaceous cyst       Relevant Orders   Ambulatory referral to Dermatology     Urinary tract infection-urinalysis is still positive recommend send a culture so that we can confirm the type of bacteria and resistance patterns since she did not respond to the Bactrim.  We will go ahead and send over [place] for now.  Encouraged her to hydrate well.  Back pain-recommend gentle stretches and heating pad okay to use Tylenol or Aleve if needed.  Go ahead and get an x-ray today just to rule out compression fracture since her pain has actually gotten worse in the last couple of days.  I do not think this is related to the UTI as its not near her kidney really is her lumbar spine.  Sebaceous cysts-she has about 4 sebaceous cyst on posterior neck.  Will refer her to dermatology for  excision.  I did let her know that it can be several months before she is able to get apatient appointment  Meds ordered this encounter  Medications  . predniSONE (DELTASONE) 20 MG tablet    Sig: Take 2 tablets (40 mg total) by mouth daily with breakfast.    Dispense:  10 tablet    Refill:  0  . nitrofurantoin, macrocrystal-monohydrate, (MACROBID) 100 MG capsule    Sig: Take 1 capsule (100 mg total) by mouth 2 (two) times daily.    Dispense:  10 capsule    Refill:  0  . ketorolac (TORADOL) injection 60 mg     Beatrice Lecher, MD

## 2019-07-12 ENCOUNTER — Encounter: Payer: Self-pay | Admitting: Family Medicine

## 2019-07-12 DIAGNOSIS — M545 Low back pain, unspecified: Secondary | ICD-10-CM

## 2019-07-13 LAB — URINE CULTURE
MICRO NUMBER:: 1119086
SPECIMEN QUALITY:: ADEQUATE

## 2019-07-19 ENCOUNTER — Emergency Department
Admission: EM | Admit: 2019-07-19 | Discharge: 2019-07-19 | Disposition: A | Discharge status: Home / Self Care | Payer: Medicare HMO | Source: Home / Self Care | Attending: Emergency Medicine | Admitting: Emergency Medicine

## 2019-07-19 ENCOUNTER — Other Ambulatory Visit: Payer: Self-pay

## 2019-07-19 DIAGNOSIS — R05 Cough: Secondary | ICD-10-CM | POA: Diagnosis not present

## 2019-07-19 DIAGNOSIS — R059 Cough, unspecified: Secondary | ICD-10-CM

## 2019-07-19 DIAGNOSIS — Z20822 Contact with and (suspected) exposure to covid-19: Secondary | ICD-10-CM

## 2019-07-19 DIAGNOSIS — Z20828 Contact with and (suspected) exposure to other viral communicable diseases: Secondary | ICD-10-CM

## 2019-07-19 DIAGNOSIS — J029 Acute pharyngitis, unspecified: Secondary | ICD-10-CM

## 2019-07-19 LAB — POC INFLUENZA A AND B ANTIGEN (URGENT CARE ONLY)
Influenza A Ag: NEGATIVE
Influenza B Ag: NEGATIVE

## 2019-07-19 LAB — SARS-COV-2 RNA, QUALITATIVE REAL-TIME RT-PCR

## 2019-07-19 MED ORDER — PREDNISONE 20 MG PO TABS: 40.0000 mg | ORAL_TABLET | Freq: Every day | ORAL | 0 refills | Status: DC

## 2019-07-19 NOTE — ED Triage Notes (Signed)
Pt felt bad starting 3 days ago. Scratchy throat, fatigue.  Denies fever.

## 2019-07-19 NOTE — ED Provider Notes (Signed)
[name]    CSN: [id number] Arrival date & time: 07/19/19  1017      History   Chief Complaint Chief Complaint  Patient presents with  . Sore Throat  . Fatigue    HPI Nancy Cisneros is a 78 y.o. female.  Patient was seen 07/11/2019.  She was treated for urinary tract infection and back pain.  He took prednisone for 3 days along with a 5-day treatment of Macrodantin for urinary tract infection.  She enters today with a cough she states has been going on for over a week.  It has been nonproductive.  She has not had a fever.  She has not had increasing shortness of breath.  He does have a history of COPD she is a previous smoker.  She has worked out in the yard this week and was wondering whether some of this has been allergy triggered.  She has a prescription for Trelegy as well as an albuterol inhaler.  She also has albuterol and ipratropium to use in a nebulizer. HPI  Past Medical History:  Diagnosis Date  . Adrenal adenoma    1 cm Left  . CAD (coronary artery disease)    Dr Prince Rome, [facility] cardiology  . COPD (chronic obstructive pulmonary disease) (HCC)    lung nodule- Dr Tilden Dome  . Diverticulosis of colon   . Hip pain, left   . Hyperlipidemia   . Hypertension     Patient Active Problem List   Diagnosis Date Noted  . Trigeminal neuralgia 04/10/2019  . Blurry vision 04/10/2019  . Basal cell carcinoma 11/02/2017  . Primary osteoarthritis of right knee 08/29/2016  . Cystocele 04/15/2016  . Acute depression 02/05/2015  . GAD (generalized anxiety disorder) 02/05/2015  . Peripheral neuropathy 05/15/2012  . RESTLESS LEGS SYNDROME 06/07/2010  . Chronic bronchitis ([place]) 04/06/2010  . Obstructive chronic bronchitis with exacerbation ([place]) 02/23/2010  . MITRAL REGURGITATION 07/30/2009  . LUMBAR SPRAIN AND STRAIN 01/26/2009  . LIVER FUNCTION TESTS, ABNORMAL, HX OF 08/27/2008  . POLYARTHRALGIA 08/13/2008  . HYPERGLYCEMIA 08/04/2008  . ALLERGIC REACTION 06/02/2008  .  ROTATOR CUFF SYNDROME, RIGHT 06/25/2007  . DIVERTICULOSIS, COLON 08/07/2006  . Hyperlipidemia 05/30/2006  . Major depressive disorder, recurrent episode ([place]) 05/30/2006  . TOBACCO DEPENDENCE 05/30/2006  . HYPERTENSION, BENIGN SYSTEMIC 05/30/2006  . Coronary atherosclerosis 05/30/2006  . GASTROESOPHAGEAL REFLUX, NO ESOPHAGITIS 05/30/2006  . INCONTINENCE, STRESS, FEMALE 05/30/2006    Past Surgical History:  Procedure Laterality Date  . Lynx mid urethral sling     Dr Beverley Fiedler    OB History   No obstetric history on file.      Home Medications    Prior to Admission medications   Medication Sig Start Date End Date Taking? Authorizing Provider  albuterol (PROAIR HFA) 108 (90 Base) MCG/ACT inhaler Inhale 2-4 puffs into the lungs every 6 (six) hours as needed. 12/19/16   Hali Marry, MD  albuterol (PROVENTIL) (2.5 MG/3ML) 0.083% nebulizer solution Take 3 mLs (2.5 mg total) by nebulization every 6 (six) hours. And PRN 10/22/18   Hali Marry, MD  AMBULATORY NON FORMULARY MEDICATION Nebulizer 10/22/18   Hali Marry, MD  AMBULATORY NON FORMULARY MEDICATION Medication Name: Home sleep study through [facility]. Dx snoring and OSA 10/24/18   Hali Marry, MD  amLODipine (NORVASC) 5 MG tablet TAKE 1 TABLET EVERY DAY 04/30/19   Hali Marry, MD  aspirin 81 MG tablet Take 81 mg by mouth daily.  [provider]  buPROPion (WELLBUTRIN XL) 150 MG 24 hr tablet TAKE 1 TABLET BY MOUTH EVERY DAY IN THE MORNING 05/20/19   Hali Marry, MD  carbamazepine (TEGRETOL XR) 100 MG 12 hr tablet TAKE 1 TABLET BY MOUTH TWICE A DAY 05/03/19   Silverio Decamp, MD  cyanocobalamin (,VITAMIN B-12,) 1000 MCG/ML injection INJECT 1 ML SUBQUTANOUSLY EVERY 7 DAYS 08/09/18   Hali Marry, MD  DULoxetine (CYMBALTA) 60 MG capsule  06/25/19   [provider]  esomeprazole (NEXIUM) 40 MG capsule TAKE 1 CAPSULE (40 MG TOTAL) BY MOUTH DAILY.  09/10/13   Hali Marry, MD  Fluticasone-Umeclidin-Vilant (TRELEGY ELLIPTA) 100-62.5-25 MCG/INH AEPB Inhale 1 Inhaler into the lungs daily. 11/02/18   Hali Marry, MD  gabapentin (NEURONTIN) 300 MG capsule Take 300 mg by mouth at bedtime. Take 3 capsules (900 mg) by mouth at bedtime 06/21/18   [provider]  HYDROcodone-acetaminophen ([place]) 10-325 MG tablet  08/16/18   [provider]  ipratropium (ATROVENT) 0.02 % nebulizer solution TAKE 2.5 MLS (0.5 MG TOTAL) BY NEBULIZATION 4 (FOUR) TIMES DAILY. 06/22/17   Hali Marry, MD  losartan (COZAAR) 25 MG tablet TAKE 1 TABLET EVERY DAY 04/30/19   Hali Marry, MD  meloxicam (MOBIC) 15 MG tablet Take 15 mg by mouth every morning. 04/25/18   [provider]  methocarbamol (ROBAXIN) 750 MG tablet Take 750 mg by mouth daily. 09/13/18   [provider]  metoprolol tartrate (LOPRESSOR) 100 MG tablet TAKE 1 TABLET TWICE DAILY 04/30/19   Hali Marry, MD  nitrofurantoin, macrocrystal-monohydrate, (MACROBID) 100 MG capsule Take 1 capsule (100 mg total) by mouth 2 (two) times daily. 07/11/19   Hali Marry, MD  predniSONE (DELTASONE) 20 MG tablet Take 2 tablets (40 mg total) by mouth daily with breakfast. 07/19/19   Darlyne Russian, MD  rOPINIRole (REQUIP) 0.5 MG tablet  12/10/16   [provider]  sertraline (ZOLOFT) 100 MG tablet TAKE 1 AND 1/2 TABLETS EVERY DAY 04/30/19   Hali Marry, MD  sucralfate (CARAFATE) 1 g tablet TAKE 1 TABLET (1 G TOTAL) BY MOUTH 4 (FOUR) TIMES DAILY - WITH MEALS AND AT BEDTIME. 04/19/19   Hali Marry, MD  valACYclovir (VALTREX) 1000 MG tablet Take 1 g by mouth 3 (three) times daily. 06/28/18   [provider]    Family History Family History  Problem Relation Age of Onset  . Hyperlipidemia Mother   . Hypertension Mother   . Cancer Father        Oat cell lung CA  . Alcohol abuse Father     Social History Social  History   Tobacco Use  . Smoking status: Former Smoker    Packs/day: 0.40    Quit date: 03/28/2016    Years since quitting: 3.3  . Smokeless tobacco: Never Used  Substance Use Topics  . Alcohol use: No  . Drug use: Not on file     Allergies   Methylprednisolone sodium succ, Codeine, Ramipril, and Solu-medrol [methylprednisolone sodium succ]   Review of Systems Review of Systems  Constitutional: Negative for chills and fever.  HENT: Positive for sore throat.   Eyes: Negative.   Respiratory: Positive for cough. Negative for shortness of breath and wheezing.   Cardiovascular: Negative.   Gastrointestinal: Negative.   Genitourinary:       She was recently treated for a urinary tract infection with 5 days of Macrobid.     Physical  Exam Triage Vital Signs ED Triage Vitals  Enc Vitals Group     BP 07/19/19 1027 (!) 152/77     Pulse Rate 07/19/19 1027 71     Resp 07/19/19 1027 20     Temp 07/19/19 1027 97.8 F (36.6 C)     Temp Source 07/19/19 1027 Oral     SpO2 07/19/19 1027 94 %     Weight 07/19/19 1029 179 lb (81.2 kg)     Height 07/19/19 1029 5\' 5"  (1.651 m)     Head Circumference --      Peak Flow --      Pain Score 07/19/19 1028 7     Pain Loc --      Pain Edu? --      Excl. in [place]? --    No data found.  Updated Vital Signs BP (!) 152/77 (BP Location: Right Arm)   Pulse 71   Temp 97.8 F (36.6 C) (Oral)   Resp 20   Ht 5\' 5"  (1.651 m)   Wt 81.2 kg   SpO2 94%   BMI 29.79 kg/m   Visual Acuity Right Eye Distance:   Left Eye Distance:   Bilateral Distance:    Right Eye Near:   Left Eye Near:    Bilateral Near:     Physical Exam Constitutional:      Appearance: She is well-developed.  HENT:     Nose: No congestion.     Mouth/Throat:     Mouth: Mucous membranes are moist. No oral lesions.     Pharynx: No posterior oropharyngeal erythema.     Comments: She does have a raspy voice Eyes:     Conjunctiva/sclera: Conjunctivae normal.  Neck:      Musculoskeletal: Neck supple.  Cardiovascular:     Rate and Rhythm: Normal rate.     Heart sounds: No murmur.  Pulmonary:     Comments: Respiratory effort is normal.  There is occasional wheeze on the right. Lymphadenopathy:     Cervical: No cervical adenopathy.  Skin:    General: Skin is warm and dry.  Neurological:     Mental Status: She is alert.      UC Treatments / Results  Labs (all labs ordered are listed, but only abnormal results are displayed) Labs Reviewed  SARS-COV-2 RNA, QUALITATIVE REAL-TIME RT-PCR    EKG   Radiology No results found.  Procedures Procedures (including critical care time)  Medications Ordered in UC Medications - No data to display  Initial Impression / Assessment and Plan / UC Course  I have reviewed the triage vital signs and the nursing notes. We will proceed with Covid testing.  Flu test was negative and she has had a flu shot.  She does have COPD and may have had some trigger to her respiratory difficulties after working in the yard.  I encouraged her to continue on her respiratory medications and to use her nebulizer as needed.  Covid testing was done.  She will be on prednisone 20 mg 2 a day for 5 days.  She was given Covid precautions.  Flu test negative.  Rapid test on Sophia platform negative.  Covid PCR sent. Pertinent labs & imaging results that were available during my care of the patient were reviewed by me and considered in my medical decision making (see chart for details).      Final Clinical Impressions(s) / UC Diagnoses   Final diagnoses:  Cough  Sore throat  Encounter for laboratory  testing for COVID-19 virus     Discharge Instructions     I have given you prednisone to help with your cough and raspy voice. Continue your inhalers and use your nebulizer as needed. Please sign on for my chart to be sure that you can get your Covid results. Please remain in quarantine until testing results are back.    ED  Prescriptions    Medication Sig Dispense Auth. Provider   predniSONE (DELTASONE) 20 MG tablet Take 2 tablets (40 mg total) by mouth daily with breakfast. 10 tablet Ladye Macnaughton, Loura Back, MD     PDMP not reviewed this encounter.   Darlyne Russian, MD 07/19/19 [phone]

## 2019-07-19 NOTE — Discharge Instructions (Addendum)
I have given you prednisone to help with your cough and raspy voice. Continue your inhalers and use your nebulizer as needed. Please sign on for my chart to be sure that you can get your Covid results. Please remain in quarantine until testing results are back. Due to significant Covid infection in the community if you develop progressive shortness of breath or chest discomfort please present to the emergency room.

## 2019-07-22 ENCOUNTER — Encounter: Payer: Self-pay | Admitting: Family Medicine

## 2019-07-22 ENCOUNTER — Ambulatory Visit (INDEPENDENT_AMBULATORY_CARE_PROVIDER_SITE_OTHER): Payer: Medicare HMO | Admitting: Family Medicine

## 2019-07-22 VITALS — BP 142/83 | Temp 96.7°F | Ht 65.0 in

## 2019-07-22 DIAGNOSIS — J441 Chronic obstructive pulmonary disease with (acute) exacerbation: Secondary | ICD-10-CM

## 2019-07-22 MED ORDER — DOXYCYCLINE HYCLATE 100 MG PO TABS: 100.0000 mg | ORAL_TABLET | Freq: Two times a day (BID) | ORAL | 0 refills | Status: DC

## 2019-07-22 MED ORDER — BENZONATATE 200 MG PO CAPS: 200.0000 mg | ORAL_CAPSULE | Freq: Two times a day (BID) | ORAL | 0 refills | Status: DC | PRN

## 2019-07-22 NOTE — Progress Notes (Signed)
She reports that she is just not getting any better. She was seen at [facility] on 07/19/2019. She stated that when she was examined she was informed that she had some rattling in her R lung. She was tested for flu and COVID and both were negative. She was given Prednisone 20 mg total of 40 mg daily for 5 days.  She denies any f/s/c//n/v/d. She stated that the cough is all day. She hasn't tried any OTC medications but did inform me that she took her husbands tessalon (2 tabs) which did help her some.Maryruth Eve, Lahoma Crocker, CMA

## 2019-07-22 NOTE — Progress Notes (Signed)
Virtual Visit via Telephone Note  I connected with Nancy Cisneros on 07/22/19 at 10:10 AM EST by telephone and verified that I am speaking with the correct person using two identifiers.   I discussed the limitations, risks, security and privacy concerns of performing an evaluation and management service by telephone and the availability of in person appointments. I also discussed with the patient that there may be a patient responsible charge related to this service. The patient expressed understanding and agreed to proceed.   Subjective:    CC: COPD with new onset cough.   HPI: She reports that she is just not getting any better.  She has had a cough since around November 20. She was seen at [facility] on 07/19/2019. She stated that when she was examined she was informed that she had some rattling in her R lung. She was tested for flu and COVID and both were negative. She was given Prednisone 20 mg total of 40 mg daily for 5 days.  She says she just finished that up and says she is not sure that it really seem to help.  She starting to notice more wheezing in her chest.  She has been using her Trelegy and says she does notice that right after she uses that she does feel little bit better.  She denies any fever or chills or sweats.  She says her oxygen levels have been good at home.  She denies any sinus symptoms such as congestion or facial pain or pressure.  She did have 1 day of some loose stools.  She describes the cough as productive but says she is just having a hard time getting the mucus out of her chest.  She denies any f/s/c//n/v/d. She stated that the cough is all day. She hasn't tried any OTC medications but did inform me that she took her husbands tessalon (2 tabs) which did help her some.   Past medical history, Surgical history, Family history not pertinant except as noted below, Social history, Allergies, and medications have been entered into the medical record, reviewed, and corrections  made.   Review of Systems: No fevers, chills, night sweats, weight loss, chest pain, or shortness of breath.   Objective:    General: Speaking clearly in complete sentences without any shortness of breath.  Alert and oriented x3.  Normal judgment. No apparent acute distress.    Impression and Recommendations:   COPD exacerbation-we will go ahead and treat with doxycycline and Tessalon [place].  I am to hold off on another round of prednisone as she has had 2 in a fairly short period of time.  If not feeling better by the end of the week then please give [place] a call back.  Right now her oxygen levels have been normal at home and she did test negative for Covid and the flu.    I discussed the assessment and treatment plan with the patient. The patient was provided an opportunity to ask questions and all were answered. The patient agreed with the plan and demonstrated an understanding of the instructions.   The patient was advised to call back or seek an in-person evaluation if the symptoms worsen or if the condition fails to improve as anticipated.  I provided 18 minutes of non-face-to-face time during this encounter.   Beatrice Lecher, MD

## 2019-07-23 LAB — SARS-COV-2 RNA,(COVID-19) QUALITATIVE NAAT: SARS CoV2 RNA: NOT DETECTED

## 2019-07-25 ENCOUNTER — Telehealth: Payer: Self-pay

## 2019-07-25 ENCOUNTER — Other Ambulatory Visit: Payer: Self-pay | Admitting: *Deleted

## 2019-07-25 DIAGNOSIS — M545 Low back pain: Secondary | ICD-10-CM | POA: Diagnosis not present

## 2019-07-25 DIAGNOSIS — G603 Idiopathic progressive neuropathy: Secondary | ICD-10-CM | POA: Diagnosis not present

## 2019-07-25 DIAGNOSIS — J441 Chronic obstructive pulmonary disease with (acute) exacerbation: Secondary | ICD-10-CM | POA: Diagnosis not present

## 2019-07-25 DIAGNOSIS — M5412 Radiculopathy, cervical region: Secondary | ICD-10-CM | POA: Diagnosis not present

## 2019-07-25 DIAGNOSIS — Z79899 Other long term (current) drug therapy: Secondary | ICD-10-CM | POA: Diagnosis not present

## 2019-07-25 DIAGNOSIS — G2581 Restless legs syndrome: Secondary | ICD-10-CM | POA: Diagnosis not present

## 2019-07-25 LAB — POCT INFLUENZA A/B
Influenza A, POC: NEGATIVE
Influenza B, POC: NEGATIVE

## 2019-07-25 LAB — POC SARS CORONAVIRUS 2 AG

## 2019-08-08 ENCOUNTER — Encounter: Payer: Self-pay | Admitting: Family Medicine

## 2019-08-08 ENCOUNTER — Other Ambulatory Visit: Payer: Self-pay | Admitting: Family Medicine

## 2019-08-08 DIAGNOSIS — J441 Chronic obstructive pulmonary disease with (acute) exacerbation: Secondary | ICD-10-CM

## 2019-08-10 DIAGNOSIS — U071 COVID-19: Secondary | ICD-10-CM | POA: Diagnosis not present

## 2019-08-10 DIAGNOSIS — I2510 Atherosclerotic heart disease of native coronary artery without angina pectoris: Secondary | ICD-10-CM | POA: Diagnosis not present

## 2019-08-10 DIAGNOSIS — E785 Hyperlipidemia, unspecified: Secondary | ICD-10-CM | POA: Diagnosis not present

## 2019-08-10 DIAGNOSIS — R531 Weakness: Secondary | ICD-10-CM | POA: Diagnosis not present

## 2019-08-10 DIAGNOSIS — F329 Major depressive disorder, single episode, unspecified: Secondary | ICD-10-CM | POA: Diagnosis not present

## 2019-08-10 DIAGNOSIS — R079 Chest pain, unspecified: Secondary | ICD-10-CM | POA: Diagnosis not present

## 2019-08-10 DIAGNOSIS — Z79899 Other long term (current) drug therapy: Secondary | ICD-10-CM | POA: Diagnosis not present

## 2019-08-10 DIAGNOSIS — I10 Essential (primary) hypertension: Secondary | ICD-10-CM | POA: Diagnosis not present

## 2019-08-10 DIAGNOSIS — M1990 Unspecified osteoarthritis, unspecified site: Secondary | ICD-10-CM | POA: Diagnosis not present

## 2019-08-10 DIAGNOSIS — J449 Chronic obstructive pulmonary disease, unspecified: Secondary | ICD-10-CM | POA: Diagnosis not present

## 2019-08-10 DIAGNOSIS — Z87891 Personal history of nicotine dependence: Secondary | ICD-10-CM | POA: Diagnosis not present

## 2019-08-12 ENCOUNTER — Ambulatory Visit: Payer: Medicare HMO

## 2019-08-12 ENCOUNTER — Telehealth: Payer: Self-pay | Admitting: Family Medicine

## 2019-08-12 DIAGNOSIS — R0603 Acute respiratory distress: Secondary | ICD-10-CM | POA: Diagnosis not present

## 2019-08-12 DIAGNOSIS — U071 COVID-19: Secondary | ICD-10-CM | POA: Diagnosis not present

## 2019-08-12 DIAGNOSIS — I2510 Atherosclerotic heart disease of native coronary artery without angina pectoris: Secondary | ICD-10-CM | POA: Diagnosis not present

## 2019-08-12 DIAGNOSIS — Z720 Tobacco use: Secondary | ICD-10-CM | POA: Diagnosis not present

## 2019-08-12 DIAGNOSIS — Z66 Do not resuscitate: Secondary | ICD-10-CM | POA: Diagnosis not present

## 2019-08-12 DIAGNOSIS — J984 Other disorders of lung: Secondary | ICD-10-CM | POA: Diagnosis not present

## 2019-08-12 DIAGNOSIS — J440 Chronic obstructive pulmonary disease with acute lower respiratory infection: Secondary | ICD-10-CM | POA: Diagnosis not present

## 2019-08-12 DIAGNOSIS — E222 Syndrome of inappropriate secretion of antidiuretic hormone: Secondary | ICD-10-CM | POA: Diagnosis not present

## 2019-08-12 DIAGNOSIS — I10 Essential (primary) hypertension: Secondary | ICD-10-CM | POA: Diagnosis not present

## 2019-08-12 DIAGNOSIS — R918 Other nonspecific abnormal finding of lung field: Secondary | ICD-10-CM | POA: Diagnosis not present

## 2019-08-12 DIAGNOSIS — J1289 Other viral pneumonia: Secondary | ICD-10-CM | POA: Diagnosis not present

## 2019-08-12 DIAGNOSIS — Z515 Encounter for palliative care: Secondary | ICD-10-CM | POA: Diagnosis not present

## 2019-08-12 DIAGNOSIS — Z9981 Dependence on supplemental oxygen: Secondary | ICD-10-CM | POA: Diagnosis not present

## 2019-08-12 DIAGNOSIS — M1990 Unspecified osteoarthritis, unspecified site: Secondary | ICD-10-CM | POA: Diagnosis not present

## 2019-08-12 DIAGNOSIS — J9621 Acute and chronic respiratory failure with hypoxia: Secondary | ICD-10-CM | POA: Diagnosis not present

## 2019-08-12 DIAGNOSIS — R0602 Shortness of breath: Secondary | ICD-10-CM | POA: Diagnosis not present

## 2019-08-12 DIAGNOSIS — J449 Chronic obstructive pulmonary disease, unspecified: Secondary | ICD-10-CM | POA: Diagnosis not present

## 2019-08-12 NOTE — Telephone Encounter (Signed)
Patient called and reports that she has Covid-19 and she has cancelled her nurse visit for today at 9:30 am. She is wanting to know if you can send something in for her at this time to [facility]. Please advise.

## 2019-08-12 NOTE — Telephone Encounter (Signed)
Nancy Cisneros, can you call and see if we could get her set up for remdesivir infusion.  Leave she is high enough risk that she would qualify.

## 2019-08-12 NOTE — Telephone Encounter (Signed)
Patient daughter Nancy Cisneros) has just called back and reports that her mom is not feeling well and has increased her Oxygen to 2.5 liters since it was down to 90. She was advised to have someone take her to the ER if the mom has any more trouble breathing, chest pain or any other symptoms to make sure she is evaluated. FYI.

## 2019-08-12 NOTE — Telephone Encounter (Signed)
Called infusion hotline at [phone]. Left patient information to get her set up. Advised them that if they could not get ahold of patient ok to contact her daughter, Jerolyn Center.   I called and left pt a msg on her VM advising that this was done, also called [name] and advised her of update.   [name] stated that patient was not able to get her oxygen levels but and she is currently at the hospital. [facility] also notes that her father Shanon Brow, has tested positive also but currently asymptomatic.   FYI to PCP

## 2019-08-13 ENCOUNTER — Telehealth: Payer: Self-pay | Admitting: Unknown Physician Specialty

## 2019-08-13 NOTE — Telephone Encounter (Signed)
Pt returned call.  She is in the hospital

## 2019-08-13 NOTE — Telephone Encounter (Signed)
Called to discuss with patient about Covid symptoms and the use of bamlanivimab, a monoclonal antibody infusion for those with mild to moderate Covid symptoms and at a high risk of hospitalization.  Pt is qualified for this infusion at the [facility] due to Age > 65   Message left to call back  

## 2019-08-19 DIAGNOSIS — R918 Other nonspecific abnormal finding of lung field: Secondary | ICD-10-CM | POA: Diagnosis not present

## 2019-08-19 DIAGNOSIS — J9621 Acute and chronic respiratory failure with hypoxia: Secondary | ICD-10-CM | POA: Diagnosis not present

## 2019-08-19 DIAGNOSIS — S83241D Other tear of medial meniscus, current injury, right knee, subsequent encounter: Secondary | ICD-10-CM | POA: Diagnosis not present

## 2019-08-19 DIAGNOSIS — H25812 Combined forms of age-related cataract, left eye: Secondary | ICD-10-CM | POA: Diagnosis not present

## 2019-08-19 DIAGNOSIS — R0602 Shortness of breath: Secondary | ICD-10-CM | POA: Diagnosis not present

## 2019-08-19 DIAGNOSIS — J449 Chronic obstructive pulmonary disease, unspecified: Secondary | ICD-10-CM | POA: Diagnosis not present

## 2019-08-20 ENCOUNTER — Telehealth: Payer: Self-pay | Admitting: Family Medicine

## 2019-08-20 ENCOUNTER — Other Ambulatory Visit: Payer: Self-pay

## 2019-08-20 MED ORDER — ACETAMINOPHEN 160 MG/5ML PO SUSP
650.00 | ORAL | Status: DC
Start: ? — End: 2019-08-20

## 2019-08-20 MED ORDER — METHOCARBAMOL 500 MG PO TABS
750.00 | ORAL_TABLET | ORAL | Status: DC
Start: ? — End: 2019-08-20

## 2019-08-20 MED ORDER — FENTANYL CITRATE-NACL 2.5-0.9 MG/250ML-% IV SOLN
10.00 | INTRAVENOUS | Status: DC
Start: ? — End: 2019-08-20

## 2019-08-20 MED ORDER — MIDAZOLAM HCL 10 MG/10ML IJ SOLN
1.00 | INTRAMUSCULAR | Status: DC
Start: ? — End: 2019-08-20

## 2019-08-20 MED ORDER — ACETAMINOPHEN 325 MG PO TABS
650.00 | ORAL_TABLET | ORAL | Status: DC
Start: ? — End: 2019-08-20

## 2019-08-20 MED ORDER — DEXMEDETOMIDINE HCL IN NACL 400 MCG/100ML IV SOLN
0.10 | INTRAVENOUS | Status: DC
Start: ? — End: 2019-08-20

## 2019-08-20 MED ORDER — MIDAZOLAM HCL 2 MG/2ML IJ SOLN
1.00 | INTRAMUSCULAR | Status: DC
Start: ? — End: 2019-08-20

## 2019-08-20 MED ORDER — GENERIC EXTERNAL MEDICATION
Status: DC
Start: ? — End: 2019-08-20

## 2019-08-20 MED ORDER — SODIUM CHLORIDE 0.9 % IV SOLN
10.00 | INTRAVENOUS | Status: DC
Start: ? — End: 2019-08-20

## 2019-08-20 MED ORDER — BIOTENE ORALBALANCE DRY MOUTH MT GEL
OROMUCOSAL | Status: DC
Start: ? — End: 2019-08-20

## 2019-08-20 MED ORDER — MORPHINE SULFATE 4 MG/ML IJ SOLN
2.00 | INTRAMUSCULAR | Status: DC
Start: ? — End: 2019-08-20

## 2019-08-20 MED ORDER — ACETAMINOPHEN 650 MG RE SUPP
650.00 | RECTAL | Status: DC
Start: ? — End: 2019-08-20

## 2019-08-20 MED ORDER — ALBUTEROL SULFATE HFA 108 (90 BASE) MCG/ACT IN AERS
2.00 | INHALATION_SPRAY | RESPIRATORY_TRACT | Status: DC
Start: ? — End: 2019-08-20

## 2019-08-20 MED ORDER — BENZONATATE 100 MG PO CAPS
100.00 | ORAL_CAPSULE | ORAL | Status: DC
Start: ? — End: 2019-08-20

## 2019-08-20 MED ORDER — GLYCOPYRROLATE 0.2 MG/ML IJ SOLN
0.20 | INTRAMUSCULAR | Status: DC
Start: ? — End: 2019-08-20

## 2019-08-20 MED ORDER — NITROGLYCERIN 0.4 MG SL SUBL
0.40 | SUBLINGUAL_TABLET | SUBLINGUAL | Status: DC
Start: ? — End: 2019-08-20

## 2019-08-20 NOTE — Telephone Encounter (Signed)
Patient spouse called and wanted to let Dr. Madilyn Fireman know that his wife Nancy Cisneros has passed aware last night. 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

## 2019-08-20 NOTE — Patient Outreach (Signed)
[facility]) Care Management  08/20/2019  Nancy Cisneros [birth year] [id number]   Referral received from [place]. Patient noted to be deceased on 2019/09/05.    Plan: RN CM will close case.   Jone Baseman, RN, MSN [facility] Care Management Coordinator Direct Line [phone] Cell [phone] Toll Free: [phone]  Fax: [phone]

## 2019-08-23 DEATH — deceased

## 2019-09-13 ENCOUNTER — Other Ambulatory Visit: Payer: Self-pay | Admitting: Family Medicine
